# Patient Record
Sex: Female | Born: 1945 | Race: Black or African American | Hispanic: No | Marital: Married | Smoking: Former smoker
Health system: Southern US, Community
[De-identification: ages and names within clinical notes are randomized; demographics above are authoritative.]

## PROBLEM LIST (undated history)

## (undated) DIAGNOSIS — R112 Nausea with vomiting, unspecified: Secondary | ICD-10-CM

## (undated) DIAGNOSIS — Z87442 Personal history of urinary calculi: Secondary | ICD-10-CM

## (undated) DIAGNOSIS — I10 Essential (primary) hypertension: Secondary | ICD-10-CM

## (undated) DIAGNOSIS — K279 Peptic ulcer, site unspecified, unspecified as acute or chronic, without hemorrhage or perforation: Secondary | ICD-10-CM

## (undated) DIAGNOSIS — G8929 Other chronic pain: Secondary | ICD-10-CM

## (undated) DIAGNOSIS — B9681 Helicobacter pylori [H. pylori] as the cause of diseases classified elsewhere: Secondary | ICD-10-CM

## (undated) DIAGNOSIS — M1990 Unspecified osteoarthritis, unspecified site: Secondary | ICD-10-CM

## (undated) DIAGNOSIS — M48062 Spinal stenosis, lumbar region with neurogenic claudication: Secondary | ICD-10-CM

## (undated) DIAGNOSIS — Z8489 Family history of other specified conditions: Secondary | ICD-10-CM

## (undated) DIAGNOSIS — K635 Polyp of colon: Secondary | ICD-10-CM

## (undated) DIAGNOSIS — Z8719 Personal history of other diseases of the digestive system: Secondary | ICD-10-CM

## (undated) DIAGNOSIS — M545 Low back pain, unspecified: Secondary | ICD-10-CM

## (undated) DIAGNOSIS — K5900 Constipation, unspecified: Secondary | ICD-10-CM

## (undated) DIAGNOSIS — F40240 Claustrophobia: Secondary | ICD-10-CM

## (undated) DIAGNOSIS — IMO0002 Reserved for concepts with insufficient information to code with codable children: Secondary | ICD-10-CM

## (undated) DIAGNOSIS — F419 Anxiety disorder, unspecified: Secondary | ICD-10-CM

## (undated) DIAGNOSIS — K219 Gastro-esophageal reflux disease without esophagitis: Secondary | ICD-10-CM

## (undated) DIAGNOSIS — Z9889 Other specified postprocedural states: Secondary | ICD-10-CM

## (undated) HISTORY — PX: APPENDECTOMY: SHX54

## (undated) HISTORY — PX: TUBAL LIGATION: SHX77

## (undated) HISTORY — DX: Essential (primary) hypertension: I10

## (undated) HISTORY — DX: Helicobacter pylori (H. pylori) as the cause of diseases classified elsewhere: K27.9

## (undated) HISTORY — PX: BACK SURGERY: SHX140

## (undated) HISTORY — PX: RETINAL LASER PROCEDURE: SHX2339

## (undated) HISTORY — PX: ESOPHAGOGASTRODUODENOSCOPY: SHX1529

## (undated) HISTORY — PX: TOTAL ABDOMINAL HYSTERECTOMY: SHX209

## (undated) HISTORY — PX: DILATION AND CURETTAGE OF UTERUS: SHX78

## (undated) HISTORY — PX: LUMBAR DISC SURGERY: SHX700

## (undated) HISTORY — DX: Peptic ulcer, site unspecified, unspecified as acute or chronic, without hemorrhage or perforation: B96.81

## (undated) HISTORY — PX: UPPER GASTROINTESTINAL ENDOSCOPY: SHX188

## (undated) HISTORY — DX: Polyp of colon: K63.5

## (undated) HISTORY — DX: Gastro-esophageal reflux disease without esophagitis: K21.9

## (undated) HISTORY — PX: ABDOMINAL HYSTERECTOMY: SHX81

## (undated) HISTORY — PX: COLONOSCOPY: SHX174

---

## 2004-09-11 ENCOUNTER — Ambulatory Visit: Payer: Self-pay | Admitting: Anesthesiology

## 2004-09-19 ENCOUNTER — Ambulatory Visit: Payer: Self-pay | Admitting: Anesthesiology

## 2004-10-04 ENCOUNTER — Ambulatory Visit: Payer: Self-pay | Admitting: Anesthesiology

## 2004-10-05 ENCOUNTER — Emergency Department: Payer: Self-pay | Admitting: Emergency Medicine

## 2004-10-11 ENCOUNTER — Ambulatory Visit: Payer: Self-pay | Admitting: Anesthesiology

## 2004-10-19 ENCOUNTER — Ambulatory Visit: Payer: Self-pay | Admitting: Obstetrics and Gynecology

## 2004-11-13 ENCOUNTER — Ambulatory Visit: Payer: Self-pay | Admitting: Anesthesiology

## 2004-11-15 ENCOUNTER — Ambulatory Visit: Payer: Self-pay | Admitting: Urology

## 2004-12-27 ENCOUNTER — Inpatient Hospital Stay (HOSPITAL_COMMUNITY): Admission: RE | Admit: 2004-12-27 | Discharge: 2004-12-30 | Payer: Self-pay | Admitting: Neurosurgery

## 2005-06-11 ENCOUNTER — Emergency Department: Payer: Self-pay | Admitting: Unknown Physician Specialty

## 2005-06-11 ENCOUNTER — Other Ambulatory Visit: Payer: Self-pay

## 2005-06-12 ENCOUNTER — Ambulatory Visit: Payer: Self-pay | Admitting: Unknown Physician Specialty

## 2005-07-16 ENCOUNTER — Ambulatory Visit: Payer: Self-pay | Admitting: Unknown Physician Specialty

## 2005-10-23 ENCOUNTER — Ambulatory Visit: Payer: Self-pay | Admitting: Obstetrics and Gynecology

## 2006-11-27 ENCOUNTER — Ambulatory Visit: Payer: Self-pay | Admitting: Internal Medicine

## 2007-03-13 ENCOUNTER — Emergency Department: Payer: Self-pay | Admitting: Emergency Medicine

## 2008-02-17 ENCOUNTER — Ambulatory Visit: Payer: Self-pay | Admitting: Internal Medicine

## 2008-07-21 ENCOUNTER — Ambulatory Visit: Payer: Self-pay | Admitting: Internal Medicine

## 2008-09-02 ENCOUNTER — Ambulatory Visit: Payer: Self-pay | Admitting: Unknown Physician Specialty

## 2008-10-19 ENCOUNTER — Encounter: Admission: RE | Admit: 2008-10-19 | Discharge: 2008-10-19 | Payer: Self-pay | Admitting: Neurosurgery

## 2009-08-11 ENCOUNTER — Ambulatory Visit: Payer: Self-pay | Admitting: Internal Medicine

## 2009-08-16 ENCOUNTER — Ambulatory Visit: Payer: Self-pay | Admitting: Unknown Physician Specialty

## 2009-10-26 ENCOUNTER — Ambulatory Visit: Payer: Self-pay | Admitting: Unknown Physician Specialty

## 2009-11-16 ENCOUNTER — Ambulatory Visit: Payer: Self-pay | Admitting: Unknown Physician Specialty

## 2010-07-03 ENCOUNTER — Encounter: Admission: RE | Admit: 2010-07-03 | Discharge: 2010-07-03 | Payer: Self-pay | Admitting: Neurosurgery

## 2010-07-19 ENCOUNTER — Encounter: Admission: RE | Admit: 2010-07-19 | Discharge: 2010-07-19 | Payer: Self-pay | Admitting: Neurosurgery

## 2010-08-09 ENCOUNTER — Encounter: Admission: RE | Admit: 2010-08-09 | Discharge: 2010-08-09 | Payer: Self-pay | Admitting: Neurosurgery

## 2010-10-17 ENCOUNTER — Encounter
Admission: RE | Admit: 2010-10-17 | Discharge: 2010-10-17 | Payer: Self-pay | Source: Home / Self Care | Attending: Neurosurgery | Admitting: Neurosurgery

## 2010-12-26 ENCOUNTER — Other Ambulatory Visit: Payer: Self-pay | Admitting: Neurosurgery

## 2010-12-26 DIAGNOSIS — M545 Low back pain: Secondary | ICD-10-CM

## 2010-12-26 DIAGNOSIS — M48061 Spinal stenosis, lumbar region without neurogenic claudication: Secondary | ICD-10-CM

## 2010-12-27 ENCOUNTER — Ambulatory Visit
Admission: RE | Admit: 2010-12-27 | Discharge: 2010-12-27 | Disposition: A | Discharge status: Home / Self Care | Payer: BC Managed Care – PPO | Source: Ambulatory Visit | Attending: Neurosurgery | Admitting: Neurosurgery

## 2010-12-27 DIAGNOSIS — M545 Low back pain, unspecified: Secondary | ICD-10-CM

## 2010-12-27 DIAGNOSIS — M48061 Spinal stenosis, lumbar region without neurogenic claudication: Secondary | ICD-10-CM

## 2011-02-28 ENCOUNTER — Emergency Department: Payer: Self-pay | Admitting: Emergency Medicine

## 2011-03-09 NOTE — Op Note (Signed)
Victoria Molina, Victoria Molina               ACCOUNT NO.:  [id number]   MEDICAL RECORD NO.:  [id number]          PATIENT TYPE:  INP   LOCATION:  2899                         FACILITY:  [facility]   PHYSICIAN:  Coletta Memos, M.D.     DATE OF BIRTH:  [birth year]   DATE OF PROCEDURE:  12/27/2004  DATE OF DISCHARGE:                                 OPERATIVE REPORT   PREOPERATIVE DIAGNOSIS:  Lumbar stenosis, L3-4,   POSTOPERATIVE DIAGNOSIS:  Lumbar stenosis, lumbar spondylosis, degenerative  disk disease, L3-4.   SURGEON:  Coletta Memos, M.D.   ASSISTANT:  Stefani Dama, M.D.   ANESTHESIA:  General endotracheal.   COMPLICATIONS:  None.   INDICATIONS:  Victoria Molina is aa 65 year old woman who presents with pain  in the back and legs, especially in the left leg when she stands or walks  for a prolonged period of time.  She has had epidural injections which have  not been greatly beneficial.  She has no bowel and bladder dysfunction.  I  therefore recommended and she agreed to undergo operative decompression.   OPERATIVE NOTE:  Victoria Molina was brought to the operating room, intubated  and placed under a general anesthetic.  Her back was prepped and she was  draped in a sterile fashion.  Using a preoperative localizing film, I  injected 20 mL of 0.5% lidocaine with 1:200,000-strength epinephrine into  the midline and paraspinous region of the lumbar area.   The lumbar decompression at L3-4 was commenced by first incising the skin  and taking that incision down to the thoracolumbar fascia.  I then expose  the lamina of L3 and L4 bilaterally.  I took an x-ray and this showed my  approach to be at L3-4.  I then proceeded with a hemilaminectomy of L3 using  both Leksell rongeurs, Kerrison punches and a high-speed drill.  A  hemilaminectomy of L5 was also performed.  With Dr. Verlee Rossetti assistance, we  were able to remove bone.  I enlarged the canal and also the neural foramina  of L4 and L5 bilaterally.   I did this without complication.  Thickened  ligamentum was identified, as were hypertrophied facets.  After thoroughly  decompressing both the nerve roots and thecal sac, I irrigated the wound.  I  inspected the wound once more.  I felt that adequate decompression had been  obtained.  I irrigated it again.  I then closed the wound in layered fashion  using Vicryl sutures.  Dermabond was used for a sterile dressing.      KC/MEDQ  D:  12/27/2004  T:  12/28/2004  Job:  [id number]

## 2011-03-09 NOTE — Discharge Summary (Signed)
[name], Victoria Molina NO.:  [id number]   MEDICAL RECORD NO.:  [id number]          PATIENT TYPE:  INP   LOCATION:  3011                         FACILITY:  [facility]   PHYSICIAN:  Coletta Memos, M.D.     DATE OF BIRTH:  [birth year]   DATE OF ADMISSION:  12/27/2004  DATE OF DISCHARGE:  12/30/2004                                 DISCHARGE SUMMARY   ADMISSION DIAGNOSIS:  Lumbar stenosis at L4-5, neurogenic claudication   DISCHARGE DIAGNOSIS:  Lumbar stenosis at L4-5, neurogenic claudication.   PROCEDURE:  Lumbar L3-4 decompression via bilateral hemilaminectomies.   COMPLICATIONS:  None.   SURGEON:  Coletta Memos, M.D.   CONDITION ON DISCHARGE:  Alive and well.   DISPOSITION:  To home.   DISCHARGE MEDICATIONS:  Darvocet, which patient already had prescribed.   INDICATIONS FOR PROCEDURE:  [name] was admitted secondary to neurogenic  claudication in the lower extremities. This was caused by lumbar stenosis at  L3-4. I therefore recommended and she agreed to undergo bilateral lumbar  decompression. She was taken to the operating room and had an uncomplicated  procedure. Postoperatively, she did quite well. She had no problems while in  the hospital. She did not feel quite up to leaving on the first few days.  She is eating well and tolerating a regular diet. Her wound is clean and dry  without signs of infection at discharge.      KC/MEDQ  D:  12/29/2004  T:  12/31/2004  Job:  [id number]

## 2011-05-10 ENCOUNTER — Other Ambulatory Visit: Payer: Self-pay | Admitting: Neurosurgery

## 2011-05-10 DIAGNOSIS — M48061 Spinal stenosis, lumbar region without neurogenic claudication: Secondary | ICD-10-CM

## 2011-05-10 DIAGNOSIS — M545 Low back pain: Secondary | ICD-10-CM

## 2011-05-14 ENCOUNTER — Ambulatory Visit
Admission: RE | Admit: 2011-05-14 | Discharge: 2011-05-14 | Disposition: A | Discharge status: Home / Self Care | Payer: Medicare Other | Source: Ambulatory Visit | Attending: Neurosurgery | Admitting: Neurosurgery

## 2011-05-14 DIAGNOSIS — M545 Low back pain, unspecified: Secondary | ICD-10-CM

## 2011-05-14 DIAGNOSIS — M48061 Spinal stenosis, lumbar region without neurogenic claudication: Secondary | ICD-10-CM

## 2011-05-14 MED ORDER — IOHEXOL 180 MG/ML  SOLN
1.0000 mL | Freq: Once | INTRAMUSCULAR | Status: AC | PRN
  Administered 2011-05-14: 1 mL via EPIDURAL

## 2011-05-14 MED ORDER — METHYLPREDNISOLONE ACETATE 40 MG/ML INJ SUSP (RADIOLOG
120.0000 mg | Freq: Once | INTRAMUSCULAR | Status: AC
  Administered 2011-05-14: 120 mg via EPIDURAL

## 2011-05-17 ENCOUNTER — Encounter: Payer: Self-pay | Admitting: Dermatology

## 2011-08-13 ENCOUNTER — Other Ambulatory Visit: Payer: Self-pay | Admitting: Neurosurgery

## 2011-08-13 DIAGNOSIS — M48061 Spinal stenosis, lumbar region without neurogenic claudication: Secondary | ICD-10-CM

## 2011-08-20 ENCOUNTER — Ambulatory Visit
Admission: RE | Admit: 2011-08-20 | Discharge: 2011-08-20 | Disposition: A | Discharge status: Home / Self Care | Payer: Medicare Other | Source: Ambulatory Visit | Attending: Neurosurgery | Admitting: Neurosurgery

## 2011-08-20 DIAGNOSIS — M48061 Spinal stenosis, lumbar region without neurogenic claudication: Secondary | ICD-10-CM

## 2011-08-20 MED ORDER — GADOBENATE DIMEGLUMINE 529 MG/ML IV SOLN: 14.0000 mL | Freq: Once | INTRAVENOUS | Status: AC | PRN

## 2011-11-02 ENCOUNTER — Other Ambulatory Visit: Payer: Self-pay | Admitting: Specialist

## 2011-11-02 DIAGNOSIS — M25512 Pain in left shoulder: Secondary | ICD-10-CM

## 2011-11-09 ENCOUNTER — Other Ambulatory Visit: Payer: Medicare Other

## 2011-11-16 ENCOUNTER — Ambulatory Visit: Payer: Self-pay | Admitting: Unknown Physician Specialty

## 2011-11-30 ENCOUNTER — Ambulatory Visit
Admission: RE | Admit: 2011-11-30 | Discharge: 2011-11-30 | Disposition: A | Discharge status: Home / Self Care | Payer: Medicare Other | Source: Ambulatory Visit | Attending: Specialist | Admitting: Specialist

## 2011-11-30 DIAGNOSIS — M25512 Pain in left shoulder: Secondary | ICD-10-CM

## 2011-12-13 ENCOUNTER — Ambulatory Visit: Payer: Self-pay | Admitting: Unknown Physician Specialty

## 2011-12-17 LAB — PATHOLOGY REPORT

## 2011-12-21 ENCOUNTER — Other Ambulatory Visit: Payer: Self-pay | Admitting: Neurosurgery

## 2011-12-21 DIAGNOSIS — M48061 Spinal stenosis, lumbar region without neurogenic claudication: Secondary | ICD-10-CM

## 2011-12-26 ENCOUNTER — Other Ambulatory Visit: Payer: Medicare Other

## 2012-01-24 ENCOUNTER — Ambulatory Visit: Payer: Self-pay | Admitting: Unknown Physician Specialty

## 2012-01-31 ENCOUNTER — Ambulatory Visit: Payer: Self-pay | Admitting: Unknown Physician Specialty

## 2012-02-29 ENCOUNTER — Ambulatory Visit: Payer: Self-pay | Admitting: Internal Medicine

## 2012-02-29 LAB — CREATININE, SERUM
Creatinine: 0.72 mg/dL (ref 0.60–1.30)
EGFR (African American): >60
EGFR (Non-African Amer.): >60

## 2012-04-02 ENCOUNTER — Emergency Department: Payer: Self-pay | Admitting: Emergency Medicine

## 2012-04-02 LAB — COMPREHENSIVE METABOLIC PANEL
Albumin: 3.8 g/dL (ref 3.4–5.0)
Alkaline Phosphatase: 117 U/L (ref 50–136)
Anion Gap: 6 — ABNORMAL LOW (ref 7–16)
BUN: 12 mg/dL (ref 7–18)
Bilirubin,Total: 0.3 mg/dL (ref 0.2–1.0)
Calcium, Total: 8.9 mg/dL (ref 8.5–10.1)
Chloride: 106 mmol/L (ref 98–107)
Co2: 31 mmol/L (ref 21–32)
Creatinine: 0.64 mg/dL (ref 0.60–1.30)
EGFR (African American): >60
EGFR (Non-African Amer.): >60
Glucose: 92 mg/dL (ref 65–99)
Osmolality: 284 (ref 275–301)
Potassium: 3.5 mmol/L (ref 3.5–5.1)
SGOT(AST): 19 U/L (ref 15–37)
SGPT (ALT): 17 U/L
Sodium: 143 mmol/L (ref 136–145)
Total Protein: 7.4 g/dL (ref 6.4–8.2)

## 2012-04-02 LAB — CBC
HCT: 40.6 % (ref 35.0–47.0)
HGB: 13.4 g/dL (ref 12.0–16.0)
MCH: 29.3 pg (ref 26.0–34.0)
MCHC: 33.1 g/dL (ref 32.0–36.0)
MCV: 89 fL (ref 80–100)
Platelet: 354 10*3/uL (ref 150–440)
RBC: 4.58 10*6/uL (ref 3.80–5.20)
RDW: 13.2 % (ref 11.5–14.5)
WBC: 8.2 10*3/uL (ref 3.6–11.0)

## 2012-04-02 LAB — URINALYSIS, COMPLETE
Bacteria: NONE SEEN
Bilirubin,UR: NEGATIVE
Blood: NEGATIVE
Glucose,UR: NEGATIVE mg/dL (ref 0–75)
Ketone: NEGATIVE
Leukocyte Esterase: NEGATIVE
Nitrite: NEGATIVE
Ph: 8 (ref 4.5–8.0)
Protein: NEGATIVE
RBC,UR: 2 /HPF (ref 0–5)
Specific Gravity: 1.008 (ref 1.003–1.030)
Squamous Epithelial: 1
WBC UR: 4 /HPF (ref 0–5)

## 2012-04-02 LAB — LIPASE, BLOOD: Lipase: 66 U/L — ABNORMAL LOW (ref 73–393)

## 2012-05-08 ENCOUNTER — Ambulatory Visit (INDEPENDENT_AMBULATORY_CARE_PROVIDER_SITE_OTHER): Payer: Medicare Other | Admitting: Gastroenterology

## 2012-05-08 ENCOUNTER — Telehealth: Payer: Self-pay

## 2012-05-08 ENCOUNTER — Encounter: Payer: Self-pay | Admitting: Gastroenterology

## 2012-05-08 VITALS — BP 126/76 | HR 92 | Temp 97.4°F | Ht 62.0 in | Wt 160.0 lb

## 2012-05-08 DIAGNOSIS — K219 Gastro-esophageal reflux disease without esophagitis: Secondary | ICD-10-CM | POA: Insufficient documentation

## 2012-05-08 DIAGNOSIS — Z1211 Encounter for screening for malignant neoplasm of colon: Secondary | ICD-10-CM | POA: Insufficient documentation

## 2012-05-08 DIAGNOSIS — D126 Benign neoplasm of colon, unspecified: Secondary | ICD-10-CM

## 2012-05-08 DIAGNOSIS — K635 Polyp of colon: Secondary | ICD-10-CM

## 2012-05-08 MED ORDER — BACLOFEN 10 MG PO TABS: ORAL_TABLET | ORAL | Status: AC

## 2012-05-08 MED ORDER — ESOMEPRAZOLE MAGNESIUM 40 MG PO CPDR: DELAYED_RELEASE_CAPSULE | ORAL | Status: DC

## 2012-05-08 NOTE — Telephone Encounter (Signed)
Called to let pt know that Dr. Darrick Penna said she does want her to remain on the Isosorbide for now. Line was busy. ( Also, per the pharmacist at the [facility] on [address] in [place], Nexium wll require a PA. They will fax over the info.

## 2012-05-08 NOTE — Progress Notes (Signed)
Faxed to PCP, Dr Dan Humphreys

## 2012-05-08 NOTE — Progress Notes (Addendum)
  Subjective:    Patient ID: Victoria Molina, female    DOB: 14-Oct-1946, 66 y.o.   MRN: [id number]  PCP: Dan Humphreys  HPI GERD UNCONTROLLED. TAKING PRILOSEC BUT sX NOT WELL CONTROLLED. NEXIUM WORKS BETTER. GAINED ~10 LBS SINCE 2002. Has had 3-4 EGDs since 2002. Had had 2-3 TCS SINCE 2002: HYPERPLASTIC POLYPS & HEMORRHOIDS. Nausea and vomiting 2-3 times a week. Feel heartburn pain: 2-3X/week usu after she eats. HAS SMALL HIATAL HERNIA.  NO PROBLEMS SWALLOWING, RECTAL BLEEDING, OR MELENA. NO ASA OR NSAIDs.  Past Medical History  Diagnosis Date  . HTN (hypertension)   . GERD (gastroesophageal reflux disease)   . Colon polyps 2006, 2011    HYPERPLASTIC  . H pylori ulcer 2002,2006    AF THEN ABO, bX NEG 2011/2013    Past Surgical History  Procedure Date  . Colonoscopy   . Upper gastrointestinal endoscopy   . Total abdominal hysterectomy   . Back surgery     Allergies  Allergen Reactions  . Iodinated Diagnostic Agents Hives and Itching    Pt pre-meds with Benadryl 50mg  PO; tolerates ESI's.  jkl  . Sulfa Antibiotics Hives and Itching  . Vioxx (Rofecoxib) Swelling    Swelling of legs and feet    Current Outpatient Prescriptions  Medication Sig Dispense Refill  . ALPRAZolam (XANAX) 0.5 MG tablet Take 0.5 mg by mouth as needed.      Marland Kitchen AMLODIPINE BESYLATE PO Take 5 mg by mouth Daily.      . isosorbide dinitrate (ISORDIL) 10 MG tablet Take 10 mg by mouth 3 (three) times daily.      Marland Kitchen omeprazole (PRILOSEC) 40 MG capsule Take 40 mg by mouth 2 (two) times daily.      Marland Kitchen esomeprazole (NEXIUM) 40 MG capsule 1 PO 30 MINUTES PRIOR TO MEALS BID  60 capsule  5      Review of Systems CT A/P W IVC MAY 2013: NAIAP    Objective:   Physical Exam  Constitutional: She is oriented to person, place, and time. She appears well-nourished. No distress.  HENT:  Head: Normocephalic and atraumatic.  Mouth/Throat: Oropharynx is clear and moist. No oropharyngeal exudate.  Eyes: No scleral icterus.  Neck:  Normal range of motion. Neck supple.  Cardiovascular: Normal rate and normal heart sounds.   Pulmonary/Chest: Effort normal and breath sounds normal. No respiratory distress.  Abdominal: Soft. Bowel sounds are normal. She exhibits no distension. There is tenderness (MILD TTP IN EPIGASTRIUM).  Lymphadenopathy:    She has no cervical adenopathy.  Neurological: She is alert and oriented to person, place, and time.       NO  NEW FOCAL DEFICITS   Psychiatric: She has a normal mood and affect.          Assessment & Plan:

## 2012-05-08 NOTE — Patient Instructions (Addendum)
YOUR SYMPTOMS ARE DUE TO PROBLEMS WITH REFLUX. YOU NEED NEXIUM BECAUSE IT WORKS.    TAKE NEXIUM (OR PRILOSEC) 30 MINUTES PRIOR TO MEALS BREAKFAST AND SUPPER.  START BACLOFEN 30 MINUTES PRIOR TO BREAKFAST, LUNCH, AND DINNER. IT MAY CAUSE DIZZINESS OR DROWSINESS. CALL ME IF YOU HAVE A LARGE CO-PAY.  FOLLOW A SOFT MECHANICAL LOW FAT DIET. SEE INFO BELOW.  LOSE 10 LBS OVER THE NEXT 2-3 MOS.  FOLLOW UP IN 2 MOS.    Lifestyle and home remedies TO HELP CONTROL REFLUX SYMPTOMS You may eliminate or reduce the frequency of heartburn by making the following lifestyle changes:    Control your weight. Being overweight is a major risk factor for heartburn and GERD. Excess pounds put pressure on your abdomen, pushing up your stomach and causing acid to back up into your esophagus.     Eat smaller meals. 4 TO 6 MEALS A DAY. This reduces pressure on the lower esophageal sphincter, helping to prevent the valve from opening and acid from washing back into your esophagus.     Loosen your belt. Clothes that fit tightly around your waist put pressure on your abdomen and the lower esophageal sphincter.    Eliminate heartburn triggers. Everyone has specific triggers.     Common triggers such as fatty or fried foods, spicy food, tomato sauce, carbonated beverages, alcohol, chocolate, mint, garlic, onion, caffeine and nicotine may make heartburn worse.     Avoid stooping or bending. Tying your shoes is OK. Bending over for longer periods to weed your garden isn't, especially soon after eating.     Don't lie down after a meal. Wait at least three to four hours after eating before going to bed, and don't lie down right after eating.   Alternative medicine   Several home remedies exist for treating GERD, but they provide only temporary relief. They include drinking baking soda (sodium bicarbonate) added to water or drinking other fluids such as baking soda mixed with cream of tartar and water.   Although these  liquids create temporary relief by neutralizing, washing away or buffering acids, eventually they aggravate the situation by adding gas and fluid to your stomach, increasing pressure and causing more acid reflux. Further, adding more sodium to your diet may increase your blood pressure and add stress to your heart, and excessive bicarbonate ingestion can alter the acid-base balance in your body.  SOFT MECHANICAL DIET This SOFT MECHANICAL DIET is restricted to:  Foods that are moist, soft-textured, and easy to chew and swallow. MEATS SHOULD BE CHOPPED OR GROUND.  Meats that are ground or are minced no larger than one-quarter inch pieces. Meats are moist with gravy or sauce added.   Foods that do not include bread or bread-like textures except soft pancakes, well-moistened with syrup or sauce.   Textures with some chewing ability required.   Casseroles without rice.   Cooked vegetables that are less than half an inch in size and easily mashed with a fork. No cooked corn, peas, broccoli, cauliflower, cabbage, Brussels sprouts, asparagus, or other fibrous, non-tender or rubbery cooked vegetables.   Canned fruit except for pineapple. Fruit must be cut into pieces no larger than half an inch in size.   Foods that do not include nuts, seeds, coconut, or sticky textures.    FOOD TEXTURES FOR DYSPHAGIA DIET LEVEL 2 -SOFT MECHANICAL DIET (includes all foods on Dysphagia Diet Level 1 - Pureed, in addition to the foods listed below)  FOOD GROUP: Breads. RECOMMENDED:  Soft pancakes, well-moistened with syrup or sauce. AVOID: All others.  FOOD GROUP: Cereals. RECOMMENDED: Cooked cereals with little texture, including oatmeal. Unprocessed wheat bran stirred into cereals for bulk. Note: If thin liquids are restricted, it is important that all of the liquid is absorbed into the cereal. AVOID: All dry cereals and any cooked cereals that may contain flax seeds or other seeds or nuts. Whole-grain, dry, or  coarse cereals. Cereals with nuts, seeds, dried fruit, and/or coconut.  FOOD GROUP: Desserts. RECOMMENDED: Pudding, custard. Soft fruit pies with bottom crust only. Canned fruit (excluding pineapple). Soft, moist cakes with icing.Frozen malts, milk shakes, frozen yogurt, eggnog, nutritional supplements, ice cream, sherbet, regular or sugar-free gelatin, or any foods that become thin liquid at either room (70 F) or body temperature (98 F). AVOID: Dry, coarse cakes and cookies. Anything with nuts, seeds, coconut, pineapple, or dried fruit. Breakfast yogurt with nuts. Rice or bread pudding.  FOOD GROUP: Fats. RECOMMENDED: Butter, margarine, cream for cereal (depending on liquid consistency recommendations), gravy, cream sauces, sour cream, sour cream dips with soft additives, mayonnaise, salad dressings, cream cheese, cream cheese spreads with soft additives, whipped toppings. AVOID: All fats with coarse or chunky additives.  FOOD GROUP: Fruits. RECOMMENDED: Soft drained, canned, or cooked fruits without seeds or skin. Fresh soft and ripe banana. Fruit juices with a small amount of pulp. If thin liquids are restricted, fruit juices should be thickened to appropriate consistency. AVOID: Fresh or frozen fruits. Cooked fruit with skin or seeds. Dried fruits. Fresh, canned, or cooked pineapple.  FOOD GROUP: Meats and Meat Substitutes. (Meat pieces should not exceed 1/4 of an inch cube and should be tender.) RECOMMENDED: Moistened ground or cooked meat, poultry, or fish. Moist ground or tender meat may be served with gravy or sauce. Casseroles without rice. Moist macaroni and cheese, well-cooked pasta with meat sauce, tuna noodle casserole, soft, moist lasagna. Moist meatballs, meatloaf, or fish loaf. Protein salads, such as tuna or egg without large chunks, celery, or onion. Cottage cheese, smooth quiche without large chunks. Poached, scrambled, or soft-cooked eggs (egg yolks should not be "runny" but  should be moist and able to be mashed with butter, margarine, or other moisture added to them). (Cook eggs to 160 F or use pasteurized eggs for safety.) Souffls may have small, soft chunks. Tofu. Well-cooked, slightly mashed, moist legumes, such as baked beans. All meats or protein substitutes should be served with sauces or moistened to help maintain cohesiveness in the oral cavity. AVOID: Dry meats, tough meats (such as bacon, sausage, hot dogs, bratwurst). Dry casseroles or casseroles with rice or large chunks. Peanut butter. Cheese slices and cubes. Hard-cooked or crisp fried eggs. Sandwiches.Pizza.  FOOD GROUP: Potatoes and Starches. RECOMMENDED: Well-cooked, moistened, boiled, baked, or mashed potatoes. Well-cooked shredded hash brown potatoes that are not crisp. (All potatoes need to be moist and in sauces.)Well-cooked noodles in sauce. Spaetzel or soft dumplings that have been moistened with butter or gravy. AVOID: Potato skins and chips. Fried or French-fried potatoes. Rice.  FOOD GROUP: Soups. RECOMMENDED: Soups with easy-to-chew or easy-to-swallow meats or vegetables: Particle sizes in soups should be less than 1/2 inch. Soups will need to be thickened to appropriate consistency if soup is thinner than prescribed liquid consistency. AVOID: Soups with large chunks of meat and vegetables. Soups with rice, corn, peas.  FOOD GROUP: Vegetables. RECOMMENDED: All soft, well-cooked vegetables. Vegetables should be less than a half inch. Should be easily mashed with a fork. AVOID: Cooked corn and  peas. Broccoli, cabbage, Brussels sprouts, asparagus, or other fibrous, non-tender or rubbery cooked vegetables.  FOOD GROUP: Miscellaneous. RECOMMENDED: Jams and preserves without seeds, jelly. Sauces, salsas, etc., that may have small tender chunks less than 1/2 inch. Soft, smooth chocolate bars that are easily chewed. AVOID: Seeds, nuts, coconut, or sticky foods. Chewy candies such as caramels or  licorice.      Low-Fat Diet BREADS, CEREALS, PASTA, RICE, DRIED PEAS, AND BEANS These products are high in carbohydrates and most are low in fat. Therefore, they can be increased in the diet as substitutes for fatty foods. They too, however, contain calories and should not be eaten in excess. Cereals can be eaten for snacks as well as for breakfast.   FRUITS AND VEGETABLES It is good to eat fruits and vegetables. Besides being sources of fiber, both are rich in vitamins and some minerals. They help you get the daily allowances of these nutrients. Fruits and vegetables can be used for snacks and desserts.  MEATS Limit lean meat, chicken, [place], and fish to no more than 6 ounces per day. Beef, Pork, and Lamb Use lean cuts of beef, pork, and lamb. Lean cuts include:  Extra-lean ground beef.  Arm roast.  Sirloin tip.  Center-cut ham.  Round steak.  Loin chops.  Rump roast.  Tenderloin.  Trim all fat off the outside of meats before cooking. It is not necessary to severely decrease the intake of red meat, but lean choices should be made. Lean meat is rich in protein and contains a highly absorbable form of iron. Premenopausal women, in particular, should avoid reducing lean red meat because this could increase the risk for low red blood cells (iron-deficiency anemia).  Chicken and [place] These are good sources of protein. The fat of poultry can be reduced by removing the skin and underlying fat layers before cooking. Chicken and [place] can be substituted for lean red meat in the diet. Poultry should not be fried or covered with high-fat sauces. Fish and Shellfish Fish is a good source of protein. Shellfish contain cholesterol, but they usually are low in saturated fatty acids. The preparation of fish is important. Like chicken and [place], they should not be fried or covered with high-fat sauces. EGGS Egg whites contain no fat or cholesterol. They can be eaten often. Try 1 to 2 egg whites  instead of whole eggs in recipes or use egg substitutes that do not contain yolk. MILK AND DAIRY PRODUCTS Use skim or 1% milk instead of 2% or whole milk. Decrease whole milk, natural, and processed cheeses. Use nonfat or low-fat (2%) cottage cheese or low-fat cheeses made from vegetable oils. Choose nonfat or low-fat (1 to 2%) yogurt. Experiment with evaporated skim milk in recipes that call for heavy cream. Substitute low-fat yogurt or low-fat cottage cheese for sour cream in dips and salad dressings. Have at least 2 servings of low-fat dairy products, such as 2 glasses of skim (or 1%) milk each day to help get your daily calcium intake. FATS AND OILS Reduce the total intake of fats, especially saturated fat. Butterfat, lard, and beef fats are high in saturated fat and cholesterol. These should be avoided as much as possible. Vegetable fats do not contain cholesterol, but certain vegetable fats, such as coconut oil, palm oil, and palm kernel oil are very high in saturated fats. These should be limited. These fats are often used in bakery goods, processed foods, popcorn, oils, and nondairy creamers. Vegetable shortenings and some peanut butters  contain hydrogenated oils, which are also saturated fats. Read the labels on these foods and check for saturated vegetable oils. Unsaturated vegetable oils and fats do not raise blood cholesterol. However, they should be limited because they are fats and are high in calories. Total fat should still be limited to 30% of your daily caloric intake. Desirable liquid vegetable oils are corn oil, cottonseed oil, olive oil, canola oil, safflower oil, soybean oil, and sunflower oil. Peanut oil is not as good, but small amounts are acceptable. Buy a heart-healthy tub margarine that has no partially hydrogenated oils in the ingredients. Mayonnaise and salad dressings often are made from unsaturated fats, but they should also be limited because of their high calorie and fat  content. Seeds, nuts, peanut butter, olives, and avocados are high in fat, but the fat is mainly the unsaturated type. These foods should be limited mainly to avoid excess calories and fat. OTHER EATING TIPS Snacks  Most sweets should be limited as snacks. They tend to be rich in calories and fats, and their caloric content outweighs their nutritional value. Some good choices in snacks are graham crackers, melba toast, soda crackers, bagels (no egg), English muffins, fruits, and vegetables. These snacks are preferable to snack crackers, [place] fries, TORTILLA CHIPS, and POTATO chips. Popcorn should be air-popped or cooked in small amounts of liquid vegetable oil. Desserts Eat fruit, low-fat yogurt, and fruit ices instead of pastries, cake, and cookies. Sherbet, angel food cake, gelatin dessert, frozen low-fat yogurt, or other frozen products that do not contain saturated fat (pure fruit juice bars, frozen ice pops) are also acceptable.  COOKING METHODS Choose those methods that use little or no fat. They include: Poaching.  Braising.  Steaming.  Grilling.  Baking.  Stir-frying.  Broiling.  Microwaving.  Foods can be cooked in a nonstick pan without added fat, or use a nonfat cooking spray in regular cookware. Limit fried foods and avoid frying in saturated fat. Add moisture to lean meats by using water, broth, cooking wines, and other nonfat or low-fat sauces along with the cooking methods mentioned above. Soups and stews should be chilled after cooking. The fat that forms on top after a few hours in the refrigerator should be skimmed off. When preparing meals, avoid using excess salt. Salt can contribute to raising blood pressure in some people.  EATING AWAY FROM HOME Order entres, potatoes, and vegetables without sauces or butter. When meat exceeds the size of a deck of cards (3 to 4 ounces), the rest can be taken home for another meal. Choose vegetable or fruit salads and ask for  low-calorie salad dressings to be served on the side. Use dressings sparingly. Limit high-fat toppings, such as bacon, crumbled eggs, cheese, sunflower seeds, and olives. Ask for heart-healthy tub margarine instead of butter.

## 2012-05-09 NOTE — Telephone Encounter (Signed)
Called pt and LMOM per her request to continue the Isosorbide for now and that the Nexium will need PA and the pharmacy is to fax the info here.

## 2012-05-09 NOTE — Telephone Encounter (Signed)
This is already being worked on ?

## 2012-05-12 ENCOUNTER — Telehealth: Payer: Self-pay | Admitting: *Deleted

## 2012-05-12 NOTE — Telephone Encounter (Signed)
Victoria Molina called today. She is having some side effects from her baclofen medication. It is making her lightheaded. She would like a call back. Thanks.

## 2012-05-12 NOTE — Telephone Encounter (Signed)
Pt aware to stop taking medication.

## 2012-05-12 NOTE — Telephone Encounter (Signed)
PLEASE CALL PT.  SHE IS ON A LOW DOSE OF BACLOFEN. SHE SHOULD STOP TAKING MEDS IF IT MAKES HER FEEL BAD.

## 2012-05-12 NOTE — Telephone Encounter (Signed)
Per Dawn: Pt returned your call, please try to call her back. Thanks

## 2012-05-12 NOTE — Telephone Encounter (Signed)
I called Pt back. She wants to know if she can take a half of pill because it makes her sick on her stomach and light headed. She did not take one this morning but she was going to take the lunch dose.Please advise.

## 2012-05-12 NOTE — Telephone Encounter (Signed)
Pt returned your call, please try to call her back. Thanks.

## 2012-05-12 NOTE — Telephone Encounter (Signed)
Tried to call with no answer  

## 2012-05-22 ENCOUNTER — Other Ambulatory Visit: Payer: Self-pay

## 2012-05-22 ENCOUNTER — Telehealth: Payer: Self-pay

## 2012-05-22 MED ORDER — ESOMEPRAZOLE MAGNESIUM 40 MG PO CPDR: DELAYED_RELEASE_CAPSULE | ORAL | Status: DC

## 2012-05-22 NOTE — Telephone Encounter (Signed)
PLEASE CALL PT.  SHE HAD BOTH PRECRIPTIONS FOR NEXIUM & BACLOFEN SENT 7/18-1 MO SUPPLY WITH A  YEAR OF REFILLS. SHE NEEDS TO CALL THE PHARMACY. SHE NEEDS TO DECIDE IF SHE WANTS ME OR DR. ELLIOTT TO BE HER GI DOCTOR. SHE SHOULD LET us KNOW WHO SHE PLANS TO FOLLOW UP WITH.Victoria Molina

## 2012-05-22 NOTE — Telephone Encounter (Signed)
Pt called and said she has not gotten her Nexium. ( Per Raynelle Fanning she is waiting on the pharmacy to call back and let her know what insurance she has). Pt also said she had been going to Dr. Mechele Collin in [place], [place], prior to coming here for second opinion with Dr. Darrick Penna. She said she was surprised  when they called her a few days ago and said she needed some more blood work, and a CT. She said she told them she thought they had done enough blood work, but they insisted she needed something else and she went and did that. She said she told them that she was not going to do the CT because it hadn't been long since she did one. She also said Dr. Earnest Conroy assistant, Selena Batten, said that she thought that the pt does not have reflux as much as she thinks it is something wrong with her lungs. She just wants to know how Dr. Darrick Penna feels about this. She is aware that Dr. Darrick Penna is at the hospital doing procedures and it might be a little while before we call her back at home.

## 2012-05-22 NOTE — Telephone Encounter (Signed)
Informed pt. She said she told Dr. Mechele Collin that she chooses Dr. Darrick Penna. I also asked pt to have her pharmacy call Raynelle Fanning in reference to her prescription insurance. Raynelle Fanning said she has been unable to get info from the pharmacy that she needs for the Nexium.

## 2012-05-23 NOTE — Telephone Encounter (Signed)
Finally received correct insurance information from pharmacy. PA has been faxed.

## 2012-05-26 NOTE — Telephone Encounter (Signed)
REVIEWED.  

## 2012-05-27 ENCOUNTER — Telehealth: Payer: Self-pay | Admitting: *Deleted

## 2012-05-27 NOTE — Telephone Encounter (Signed)
Victoria Molina called today. She is having a problem getting her nexium covered through her medicare part B. Please call her back. Thanks.

## 2012-05-27 NOTE — Telephone Encounter (Signed)
Called Pt and told her what was going on with the insurance.

## 2012-05-30 NOTE — Progress Notes (Signed)
Reminder in epic to follow up in 2 months with SF in E30 

## 2012-06-18 NOTE — Assessment & Plan Note (Signed)
NOT IDEALLY CONTROLLED DUE TO LIFESTYLE FACTORS.  DISCUSSED MODIFYING GERD FACTORS. HO GIVEN.  TAKE NEXIUM (OR PRILOSEC) 30 MINUTES PRIOR TO MEALS BREAKFAST AND SUPPER.  START BACLOFEN 30 MINUTES PRIOR TO BREAKFAST, LUNCH, AND DINNER. IT MAY CAUSE DIZZINESS OR DROWSINESS. CALL ME IF YOU HAVE A LARGE CO-PAY.  FOLLOW A SOFT MECHANICAL LOW FAT DIET.   LOSE 10 LBS OVER THE NEXT 2-3 MOS.  FOLLOW UP IN 2 MOS.

## 2012-06-20 ENCOUNTER — Ambulatory Visit: Payer: Self-pay | Admitting: Specialist

## 2012-06-20 LAB — CREATININE, SERUM
Creatinine: 0.81 mg/dL (ref 0.60–1.30)
EGFR (African American): >60
EGFR (Non-African Amer.): >60

## 2012-06-24 NOTE — Progress Notes (Signed)
Faxed to PCP

## 2012-07-09 ENCOUNTER — Ambulatory Visit (INDEPENDENT_AMBULATORY_CARE_PROVIDER_SITE_OTHER): Payer: Medicare Other | Admitting: Gastroenterology

## 2012-07-09 ENCOUNTER — Encounter: Payer: Self-pay | Admitting: Gastroenterology

## 2012-07-09 VITALS — BP 132/69 | HR 69 | Temp 98.1°F | Ht 62.0 in | Wt 156.2 lb

## 2012-07-09 DIAGNOSIS — D126 Benign neoplasm of colon, unspecified: Secondary | ICD-10-CM

## 2012-07-09 DIAGNOSIS — K635 Polyp of colon: Secondary | ICD-10-CM

## 2012-07-09 DIAGNOSIS — K219 Gastro-esophageal reflux disease without esophagitis: Secondary | ICD-10-CM

## 2012-07-09 MED ORDER — ESOMEPRAZOLE MAGNESIUM 40 MG PO CPDR: DELAYED_RELEASE_CAPSULE | ORAL | Status: DC

## 2012-07-09 NOTE — Assessment & Plan Note (Addendum)
Sx BETTER CONTROLLED.  CONTINUE WEIGHT LOSS EFFORTS.  CONTINUE THE SOFT MECHANICAL LOW FAT DIET.  CONTINUE BACLOFEN AND NEXIUM. REFILL NEXIUM FOR 1 YEAR.  FOLLOW UP IN 6 MOS.

## 2012-07-09 NOTE — Assessment & Plan Note (Signed)
TCS IN 2021

## 2012-07-09 NOTE — Progress Notes (Signed)
  Subjective:    Patient ID: Victoria Molina, female    DOB: [birth year], 66 y.o.   MRN: [id number]  PCP: Dan Humphreys  HPI Feel heartburn pain(BURNING):  1 X/week usu after she eats. HAS SMALL HIATAL HERNIA. NO PROBLEMS SWALLOWING, RECTAL BLEEDING, OR MELENA. NO ASA OR NSAIDs. Lost 4 lbs since last visit. C/o sob and concenrs about having surgery. ALSO C/O SINUS DRAINAGE/PRESSURE NOT RESPONDING TO ABX. C/O MILD EPIGASTRIC DISCOMFORT AND L RIB PAIN.   Past Medical History  Diagnosis Date  . HTN (hypertension)   . GERD (gastroesophageal reflux disease)   . Colon polyps 2006, 2011    HYPERPLASTIC  . H pylori ulcer 2002,2006    AF THEN ABO, bX NEG 2011/2013    Past Surgical History  Procedure Date  . Colonoscopy   . Upper gastrointestinal endoscopy   . Total abdominal hysterectomy   . Back surgery    Allergies  Allergen Reactions  . Iodinated Diagnostic Agents Hives and Itching    Pt pre-meds with Benadryl 50mg  PO; tolerates ESI's.  jkl  . Sulfa Antibiotics Hives and Itching  . Vioxx (Rofecoxib) Swelling    Swelling of legs and feet    Current Outpatient Prescriptions  Medication Sig Dispense Refill  . ALPRAZolam (XANAX) 0.5 MG tablet Take 0.5 mg by mouth as needed.      Marland Kitchen AMLODIPINE BESYLATE PO Take 5 mg by mouth Daily.      Marland Kitchen amoxicillin (AMOXIL) 500 MG capsule Take 500 mg by mouth 3 (three) times daily.       Marland Kitchen esomeprazole (NEXIUM) 40 MG capsule 1 PO 30 MINUTES PRIOR TO MEALS BID    . isosorbide dinitrate (ISORDIL) 10 MG tablet Take 10 mg by mouth 3 (three) times daily.      Marland Kitchen         BACLOFEN 1/2-1 PO QAC TID      Review of Systems     Objective:   Physical Exam  Vitals reviewed. Constitutional: She is oriented to person, place, and time. She appears well-nourished. No distress.  HENT:  Head: Normocephalic and atraumatic.  Eyes: Pupils are equal, round, and reactive to light. No scleral icterus.  Neck: Normal range of motion. Neck supple.  Cardiovascular: Normal  rate, regular rhythm and normal heart sounds.   Pulmonary/Chest: Effort normal and breath sounds normal.  Abdominal: Soft. Bowel sounds are normal. She exhibits no distension.  Musculoskeletal: Normal range of motion. She exhibits no edema.  Neurological: She is alert and oriented to person, place, and time.  Psychiatric: She has a normal mood and affect.          Assessment & Plan:

## 2012-07-09 NOTE — Progress Notes (Signed)
Faxed to PCP

## 2012-07-09 NOTE — Patient Instructions (Addendum)
KEEP UP THE GOOD WORK! CONTINUE YOUR WEIGHT LOSS EFFORTS.  CONTINUE THE SOFT MECHANICAL LOW FAT DIET.  YOU MAY USE ZYRTEC 1/2 TO 1 PILL AT NIGHT FOR THE NEXT 7 DAYS TO TREAT ALLERGY SYMPTOMS.  CONTINUE BACLOFEN AND NEXIUM.  FOLLOW UP IN 6 MOS.

## 2012-07-10 ENCOUNTER — Telehealth: Payer: Self-pay | Admitting: *Deleted

## 2012-07-10 NOTE — Telephone Encounter (Signed)
Pt would like to know if you can call in the omeprazole 40 mg to the [facility] in [place]. She can not afford the nexium co-pay. Please advise

## 2012-07-10 NOTE — Telephone Encounter (Signed)
Victoria Molina called today. She went to pick up her nexium and the copay was $70. She cannot afford it and wanted to know what you can do to help her. Thanks.

## 2012-07-14 MED ORDER — OMEPRAZOLE 20 MG PO CPDR: DELAYED_RELEASE_CAPSULE | ORAL | Status: DC

## 2012-07-14 NOTE — Addendum Note (Signed)
Addended by: [place] on: 07/14/2012 03:41 PM   Modules accepted: Orders

## 2012-07-14 NOTE — Telephone Encounter (Signed)
LMOM to call back

## 2012-07-14 NOTE — Telephone Encounter (Signed)
Recall made 

## 2012-07-14 NOTE — Telephone Encounter (Addendum)
PLEASE CALL PT.  RX FOR OMEPRAZOLE SENT.  TAKE PRILOSEC 30 MINUTES PRIOR TO MEALS BREAKFAST AND SUPPER.  CONTINUE BACLOFEN 30 MINUTES PRIOR TO BREAKFAST, LUNCH, AND DINNER. IT MAY CAUSE DIZZINESS OR DROWSINESS.  FOLLOW A SOFT MECHANICAL LOW FAT DIET.  LOSE 10 LBS OVER THE NEXT 2-3 MOS.   FOLLOW UP IN OCT OR NOV 2013.

## 2012-07-18 NOTE — Progress Notes (Signed)
Reminder in epic to follow up in 6 months and TCS in 2021

## 2012-07-24 NOTE — Telephone Encounter (Signed)
Mailed Pt a letter to contact our office

## 2012-07-28 NOTE — Telephone Encounter (Signed)
Pt called this morning and knows about the medication.

## 2012-07-29 ENCOUNTER — Encounter: Payer: Self-pay | Admitting: *Deleted

## 2012-08-27 ENCOUNTER — Ambulatory Visit: Payer: Medicare Other | Admitting: Gastroenterology

## 2012-09-10 ENCOUNTER — Encounter: Payer: Self-pay | Admitting: Gastroenterology

## 2012-09-10 ENCOUNTER — Ambulatory Visit (INDEPENDENT_AMBULATORY_CARE_PROVIDER_SITE_OTHER): Payer: Medicare Other | Admitting: Gastroenterology

## 2012-09-10 VITALS — BP 133/77 | HR 78 | Temp 97.2°F | Ht 62.0 in | Wt 160.8 lb

## 2012-09-10 DIAGNOSIS — K219 Gastro-esophageal reflux disease without esophagitis: Secondary | ICD-10-CM

## 2012-09-10 NOTE — Progress Notes (Signed)
  Subjective:    Patient ID: Victoria Molina, female    DOB: 09-Nov-1945, 67 y.o.   MRN: [id number]  PCP: Dan Humphreys  HPI C/O SINUS PROBLEMS AND COUGH. HEARTBURN DOING WELL ON OMEPRAZOLE BID. CAN HAVE RARE BREAKTHROUGH SX IF EATS THE WRONG THING.PAIN IN RUQ WHEN SHE EATS & CAN HAPPEN EVERY DAY. THOUGHT IT WAS HER GB. STILL TAKING ABX: AMOXICILLIN. MORE NOTICEABLE SINCE BEEN ON ABX. WAS SO HOARSE SHE COULDN'T TALK. BEEN IN THE HOUSE FOR THE PAST WEEK. TAKING 2/DAY BECAUSE SHE DOESN'T EAT AFTER 5 PM.  Past Medical History  Diagnosis Date  . HTN (hypertension)   . GERD (gastroesophageal reflux disease)   . Colon polyps 2006, 2011    HYPERPLASTIC  . H pylori ulcer 2002,2006    AF THEN ABO, bX NEG 2011/2013    Past Surgical History  Procedure Date  . Upper gastrointestinal endoscopy   . Total abdominal hysterectomy   . Back surgery   . Colonoscopy 2006, 2011    HYPERPLASTIC POLYPS    Allergies  Allergen Reactions  . Iodinated Diagnostic Agents Hives and Itching    Pt pre-meds with Benadryl 50mg  PO; tolerates ESI's.  jkl  . Sulfa Antibiotics Hives and Itching  . Vioxx (Rofecoxib) Swelling    Swelling of legs and feet    Current Outpatient Prescriptions  Medication Sig Dispense Refill  . ALPRAZolam (XANAX) 0.5 MG tablet Take 0.5 mg by mouth as needed.      Marland Kitchen AMLODIPINE BESYLATE PO Take 5 mg by mouth Daily.      . baclofen (LIORESAL) 10 MG tablet Take 10 mg by mouth 3 (three) times daily. Takes mostly 2 times daily      . benzonatate (TESSALON) 100 MG capsule Take 100 mg by mouth 3 (three) times daily as needed.      . cefdinir (OMNICEF) 300 MG capsule Take 300 mg by mouth 2 (two) times daily.      Marland Kitchen omeprazole (PRILOSEC) 20 MG capsule 1 po bid 30 minutes before meals FOREVER    . amoxicillin (AMOXIL) 500 MG capsule Take 500 mg by mouth 3 (three) times daily.       . isosorbide dinitrate (ISORDIL) 10 MG tablet Take 10 mg by mouth 2 (two) times daily.           Review of  Systems     Objective:   Physical Exam        Assessment & Plan:

## 2012-09-10 NOTE — Progress Notes (Signed)
Faxed to PCP

## 2012-09-10 NOTE — Patient Instructions (Signed)
HAPPY HOLIDAYS!  CONTINUE WEIGHT LOSS EFFORTS AFTER YOU FEEL BETTER.   CONTINUE THE SOFT MECHANICAL LOW FAT DIET.   CONTINUE BACLOFEN AND OMEPRAZOLE.  FOLLOW UP IN 6 MOS.

## 2012-09-10 NOTE — Assessment & Plan Note (Signed)
SX CONTROLLED.  CONTINUE WEIGHT LOSS EFFORTS.  CONTINUE THE SOFT MECHANICAL LOW FAT DIET.  CONTINUE BACLOFEN AND OMEPRAZOLE. REFILL IN SEP 2014. FOLLOW UP IN 6 MOS.

## 2012-09-16 NOTE — Progress Notes (Signed)
Reminder in epic to follow up in 6 months with SF °

## 2012-10-09 ENCOUNTER — Other Ambulatory Visit: Payer: Self-pay | Admitting: Neurosurgery

## 2012-10-09 DIAGNOSIS — M48061 Spinal stenosis, lumbar region without neurogenic claudication: Secondary | ICD-10-CM

## 2012-10-14 ENCOUNTER — Ambulatory Visit
Admission: RE | Admit: 2012-10-14 | Discharge: 2012-10-14 | Disposition: A | Discharge status: Home / Self Care | Payer: Medicare Other | Source: Ambulatory Visit | Attending: Neurosurgery | Admitting: Neurosurgery

## 2012-10-14 VITALS — BP 155/79 | HR 67

## 2012-10-14 DIAGNOSIS — M48061 Spinal stenosis, lumbar region without neurogenic claudication: Secondary | ICD-10-CM

## 2012-10-14 MED ORDER — METHYLPREDNISOLONE ACETATE 40 MG/ML INJ SUSP (RADIOLOG
120.0000 mg | Freq: Once | INTRAMUSCULAR | Status: AC
  Administered 2012-10-14: 120 mg via EPIDURAL

## 2012-10-14 MED ORDER — IOHEXOL 180 MG/ML  SOLN
1.0000 mL | Freq: Once | INTRAMUSCULAR | Status: AC | PRN
  Administered 2012-10-14: 1 mL via EPIDURAL

## 2012-10-14 NOTE — Progress Notes (Signed)
Patient states she pre-medicated with Benadryl 50mg  PO for iodinated contrast sensitivity.  jkl

## 2012-11-04 ENCOUNTER — Other Ambulatory Visit: Payer: Self-pay | Admitting: Neurosurgery

## 2012-11-04 DIAGNOSIS — M48061 Spinal stenosis, lumbar region without neurogenic claudication: Secondary | ICD-10-CM

## 2012-11-13 ENCOUNTER — Ambulatory Visit
Admission: RE | Admit: 2012-11-13 | Discharge: 2012-11-13 | Disposition: A | Discharge status: Home / Self Care | Payer: Medicare Other | Source: Ambulatory Visit | Attending: Neurosurgery | Admitting: Neurosurgery

## 2012-11-13 DIAGNOSIS — M48061 Spinal stenosis, lumbar region without neurogenic claudication: Secondary | ICD-10-CM

## 2012-11-13 MED ORDER — GADOBENATE DIMEGLUMINE 529 MG/ML IV SOLN
15.0000 mL | Freq: Once | INTRAVENOUS | Status: AC | PRN
  Administered 2012-11-13: 15 mL via INTRAVENOUS

## 2012-12-11 ENCOUNTER — Encounter: Payer: Self-pay | Admitting: Dermatology

## 2012-12-11 DIAGNOSIS — C4491 Basal cell carcinoma of skin, unspecified: Secondary | ICD-10-CM

## 2012-12-11 HISTORY — DX: Basal cell carcinoma of skin, unspecified: C44.91

## 2013-03-13 ENCOUNTER — Encounter: Payer: Self-pay | Admitting: Gastroenterology

## 2013-04-30 ENCOUNTER — Encounter: Payer: Self-pay | Admitting: Gastroenterology

## 2013-04-30 ENCOUNTER — Ambulatory Visit (INDEPENDENT_AMBULATORY_CARE_PROVIDER_SITE_OTHER): Payer: Medicare Other | Admitting: Gastroenterology

## 2013-04-30 VITALS — BP 120/70 | HR 75 | Temp 97.4°F | Ht 63.0 in | Wt 152.8 lb

## 2013-04-30 DIAGNOSIS — K219 Gastro-esophageal reflux disease without esophagitis: Secondary | ICD-10-CM

## 2013-04-30 MED ORDER — BACLOFEN 10 MG PO TABS: ORAL_TABLET | ORAL | Status: DC

## 2013-04-30 MED ORDER — OMEPRAZOLE 20 MG PO CPDR: DELAYED_RELEASE_CAPSULE | ORAL | Status: DC

## 2013-04-30 NOTE — Assessment & Plan Note (Signed)
Sx fairly well controlled.  REFILL OMEPRAZOLE AND BACLOFEN FOR 1 YEAR. LOW FAT DIET OPV IN 6 MOS

## 2013-04-30 NOTE — Patient Instructions (Signed)
CONTINUE YOUR WEIGHT LOSS EFFORTS.   CONTINUE THE SOFT MECHANICAL LOW FAT DIET.   CONTINUE BACLOFEN AND OMEPRAZOLE.   FOLLOW UP IN 6 MOS.

## 2013-04-30 NOTE — Progress Notes (Signed)
  Subjective:    Patient ID: Victoria Molina, female    DOB: January 11, 1946, 67 y.o.   MRN: [id number]  Elmo Putt, MD  HPI C/O BURNING WHEN SHE STARTS TO WALK. USUALLY IN THE AFTERNOON AROUND 6 PM. USU. TAKES BACLOFEN AND OMEPRAZOLE BEFORE SUPPER. USES TUMS BEFORE WALKING. STILL FEEL REAL BAD BETTER. AFTER 2 LAPS THE BURNING CEASES. EATS A LIGHT MEAL IN THE EVENING. COUGH AND SINUSES ARE BETTER. CHANGED EATING HABITS AND THINGS ARE BETTER. 8 LB WEIGHT LOSS. MAY TAKE A LAXATIVE: EPSON SALT ONCE EVERY 2 WEEKS. BM ONCE A DAY-LIGHT BROWN  PT DENIES FEVER, CHILLS, BRBPR, nausea, vomiting, melena, diarrhea, abd pain, problems swallowing.  Past Medical History  Diagnosis Date  . HTN (hypertension)   . GERD (gastroesophageal reflux disease)   . Colon polyps 2006, 2011    HYPERPLASTIC  . H pylori ulcer 2002,2006    AF THEN ABO, bX NEG 2011/2013    Past Surgical History  Procedure Laterality Date  . Upper gastrointestinal endoscopy    . Total abdominal hysterectomy    . Back surgery    . Colonoscopy  2006, 2011    HYPERPLASTIC POLYPS    Allergies  Allergen Reactions  . Iodinated Diagnostic Agents Hives and Itching    Pt pre-meds with Benadryl 50mg  PO; tolerates ESI's.  jkl  . Sulfa Antibiotics Hives and Itching  . Vioxx (Rofecoxib) Swelling    Swelling of legs and feet    Current Outpatient Prescriptions  Medication Sig Dispense Refill  . ALPRAZolam (XANAX) 0.5 MG tablet Take 0.5 mg by mouth as needed.      Marland Kitchen AMLODIPINE BESYLATE PO Take 5 mg by mouth Daily.      Marland Kitchen gabapentin (NEURONTIN) 300 MG capsule Take 300 mg by mouth daily.   IN THE AFTERNOON    . isosorbide dinitrate (ISORDIL) 10 MG tablet Take 10 mg by mouth 2 (two) times daily.       Marland Kitchen omeprazole (PRILOSEC) 20 MG capsule 1 po bid 30 minutes before meals FOREVER    .        Marland Kitchen baclofen (LIORESAL) 10 MG tablet Take 10 mg by mouth 3 (three) times daily. Takes mostly 2 times daily      . benzonatate (TESSALON) 100 MG capsule  Take 100 mg by mouth 3 (three) times daily as needed.      .            Review of Systems     Objective:   Physical Exam  Vitals reviewed. Constitutional: She is oriented to person, place, and time. She appears well-nourished. No distress.  HENT:  Head: Normocephalic and atraumatic.  Mouth/Throat: Oropharynx is clear and moist. No oropharyngeal exudate.  Eyes: Pupils are equal, round, and reactive to light. No scleral icterus.  Neck: Normal range of motion. Neck supple.  Cardiovascular: Normal rate, regular rhythm and normal heart sounds.   Pulmonary/Chest: Effort normal and breath sounds normal. No respiratory distress.  Abdominal: Soft. Bowel sounds are normal. She exhibits no distension. There is no tenderness.  Musculoskeletal: She exhibits no edema.  Lymphadenopathy:    She has no cervical adenopathy.  Neurological: She is alert and oriented to person, place, and time.  NO FOCAL DEFICITS   Psychiatric: She has a normal mood and affect.          Assessment & Plan:

## 2013-04-30 NOTE — Progress Notes (Signed)
CC PCP 

## 2013-05-04 NOTE — Progress Notes (Signed)
REMINDER OV MADE IN EPIC

## 2013-05-27 ENCOUNTER — Other Ambulatory Visit: Payer: Self-pay

## 2013-06-25 ENCOUNTER — Encounter: Payer: Self-pay | Admitting: Gastroenterology

## 2013-06-25 ENCOUNTER — Ambulatory Visit (INDEPENDENT_AMBULATORY_CARE_PROVIDER_SITE_OTHER): Payer: Medicare Other | Admitting: Gastroenterology

## 2013-06-25 ENCOUNTER — Other Ambulatory Visit: Payer: Self-pay | Admitting: Gastroenterology

## 2013-06-25 VITALS — BP 122/70 | HR 70 | Temp 97.9°F | Ht 63.0 in | Wt 152.4 lb

## 2013-06-25 DIAGNOSIS — R1310 Dysphagia, unspecified: Secondary | ICD-10-CM | POA: Insufficient documentation

## 2013-06-25 DIAGNOSIS — K3189 Other diseases of stomach and duodenum: Secondary | ICD-10-CM

## 2013-06-25 DIAGNOSIS — R1013 Epigastric pain: Secondary | ICD-10-CM | POA: Insufficient documentation

## 2013-06-25 NOTE — Progress Notes (Signed)
Subjective:    Patient ID: Victoria Molina, female    DOB: [birth year], 67 y.o.   MRN: [id number]  Elmo Putt, MD  HPI Feels like she has a bubble in her middle of her chest. Started Dexilant and sx better. WHEN SHE BURPS FOOD MAY COME BACK UP. SX MOST OF THE TIME. STARTS WITH BURPING AND THE THE BUBBLE FEELING. WHEN SHE EATS FEELS LIKE ITS THERE. NO TROUBLE SWALLOWING. DRINKS TEA-CAFFEINE FROM [employer]. DOESN'T CHEW GUM OR PEPPERMINT. PAIN FOR SHOULDER MADE HER SICK FOR 2 WEEKS. EVERY TIME SHE EATS STOMACH GETS UPSET. HAS PAIN IN RUQ AFTER TAKING PAIN PILL. EASING OFF. LOST 8 LBS SINCE JUL 2013 INTENTIONALLY. RARE CONSTIPATION. MAY FEEL BURNING IN CHEST WHEN SHE'S EXERCISING.  PT DENIES FEVER, CHILLS, BRBPR, nausea, vomiting, melena, OR problems swallowing.  Past Medical History  Diagnosis Date  . HTN (hypertension)   . GERD (gastroesophageal reflux disease)   . Colon polyps 2006, 2011    HYPERPLASTIC  . H pylori ulcer 2002,2006    AF THEN ABO, bX NEG 2011/2013   Past Surgical History  Procedure Laterality Date  . Upper gastrointestinal endoscopy    . Total abdominal hysterectomy    . Back surgery    . Colonoscopy  2006, 2011    HYPERPLASTIC POLYPS   Allergies  Allergen Reactions  . Iodinated Diagnostic Agents Hives and Itching    Pt pre-meds with Benadryl 50mg  PO; tolerates ESI's.  jkl  . Sulfa Antibiotics Hives and Itching  . Vioxx [Rofecoxib] Swelling    Swelling of legs and feet    Current Outpatient Prescriptions  Medication Sig Dispense Refill  . ALPRAZolam (XANAX) 0.5 MG tablet Take 0.5 mg by mouth as needed.      Marland Kitchen AMLODIPINE BESYLATE PO Take 5 mg by mouth Daily.      Marland Kitchen dexlansoprazole (DEXILANT) 60 MG capsule Take 60 mg by mouth daily.      .      .      . baclofen (LIORESAL) 10 MG tablet Takes 1 PO QAC NOT TAKING   . benzonatate (TESSALON) 100 MG capsule Take 100 mg by mouth 3 (three) times daily as needed.    .      . gabapentin (NEURONTIN) 300 MG  capsule Take 300 mg by mouth daily.       . isosorbide dinitrate (ISORDIL) 10 MG tablet Take 10 mg by mouth 2 (two) times daily.            Review of Systems     Objective:   Physical Exam  Vitals reviewed. Constitutional: She is oriented to person, place, and time. She appears well-nourished. No distress.  HENT:  Head: Normocephalic and atraumatic.  Mouth/Throat: Oropharynx is clear and moist. No oropharyngeal exudate.  Eyes: Pupils are equal, round, and reactive to light. No scleral icterus.  Neck: Normal range of motion. Neck supple.  Cardiovascular: Normal rate, regular rhythm and normal heart sounds.   Pulmonary/Chest: Effort normal and breath sounds normal. No respiratory distress.  Abdominal: Soft. Bowel sounds are normal. She exhibits no distension. There is tenderness. There is no rebound and no guarding.  MILD RUQ TTP  Musculoskeletal: Normal range of motion. She exhibits no edema.  Lymphadenopathy:    She has no cervical adenopathy.  Neurological: She is alert and oriented to person, place, and time.  NO FOCAL DEFICITS   Psychiatric:  FLAT AFFECT, SLIGHTLY ANXIOUS MOOD  Assessment & Plan:

## 2013-06-25 NOTE — Progress Notes (Signed)
Reminder in epic °

## 2013-06-25 NOTE — Assessment & Plan Note (Addendum)
LAST EGD 2013. EGD/TCS UTD-SX IMPROVED BUT NOT RESOLVED WITH DEXILANT. DIFFERENTIAL DIAGNOSIS INCLUDES ACHALASIA, NON-ULCER DYSPEPSIA, UNCONTROLLED GERD, AND LESS LIKELY ESO OR GE JXN CA.  BPE ASAP. IF ABNL CONSIDER EGD OR MANOMETRY FULL LIQUID DIET FOR 24-48 HRS IF SX FLARE CONTINUE DEXILANT SOFT MECHANICAL DIET OPV IN 3 MOS

## 2013-06-25 NOTE — Assessment & Plan Note (Signed)
EGD/TCS UTD-SX IMPROVED BUT NOT RESOLVED WITH DEXILANT. DIFFERENTIAL DIAGNOSIS INCLUDES ACHALASIA, NON-ULCER DYSPEPSIA, UNCONTROLLED GERD, AND LESS LIKELY ESO OR GE JXN CA.  BPE ASAP. IF ABNL CONSIDER EGD OR MANOMETRY FULL LIQUID DIET FOR 24-48 HRS IF SX FLARE CONTINUE DEXILANT SOFT MECHANICAL DIET OPV IN 3 MOS

## 2013-06-25 NOTE — Patient Instructions (Signed)
COMPLETE BARIUM SWALLOWING TEST.  FOLLOW A LOW FAT/SOFT MECHANICAL DIET. SEE INFO BELOW.  WHEN YOU HAVE A FLARE FOLLOW A FULL LIQUID DIET FOR 24-48 HRS. SEE INFO BELOW.   AVOID DAIRY PRODUCTS.  CONTINUE DEXILANT  FOLLOW UP IN 3 MOS.    Full Liquid Diet A high-calorie, high-protein supplement should be used to meet your nutritional requirements when the full liquid diet is continued for more than 2 or 3 days. If this diet is to be used for an extended period of time (more than 7 days), a multivitamin should be considered.  Breads and Starches  Allowed: None are allowed except crackersWHOLE OR pureed (made into a thick, smooth soup) in soup. Cooked, refined corn, oat, rice, rye, and wheat cereals are also allowed.   Avoid: Any others.    Potatoes/Pasta/Rice  Allowed: ANY ITEM AS A SOUP OR SMALL PLATE OF MASHED POTATOES OR RICE.       Vegetables  Allowed: Strained tomato or vegetable juice. Vegetables pureed in soup.   Avoid: Any others.    Fruit  Allowed: Any strained fruit juices and fruit drinks. Include 1 serving of citrus or vitamin C-enriched fruit juice daily.   Avoid: Any others.  Meat and Meat Substitutes  Allowed: Egg  Avoid: Any meat, fish, or fowl. All cheese.  Milk  Allowed: SOY Milk beverages, including milk shakes and instant breakfast mixes. Smooth yogurt.   Avoid: Any others. Avoid dairy products if not tolerated.    Soups and Combination Foods  Allowed: Broth, strained cream soups. Strained, broth-based soups.   Avoid: Any others.    Desserts and Sweets  Allowed: flavored gelatin, tapioca, plain LACTOSE FREE ice cream, sherbet, smooth pudding, junket, fruit ices, frozen ice pops, pudding pops,, frozen fudge pops, chocolate syrup. Sugar, honey, jelly, syrup.   Avoid: Any others.  Fats and Oils  Allowed: Margarine, butter, cream, sour cream, oils.   Avoid: Any others.  Beverages  Allowed: All.   Avoid: None.   Condiments  Allowed: Iodized salt, pepper, spices, flavorings. Cocoa powder.   Avoid: Any others.    SAMPLE MEAL PLAN Breakfast   cup orange juice.   1 cup cooked wheat cereal.   1 cup SOY milk.   1 cup beverage (coffee or tea).   Cream or sugar, if desired.    Midmorning Snack  2 SCRAMBLED OR HARD BOILED EGG   Lunch  1 cup cream soup.    cup fruit juice.   1 cup SOY milk.    cup custard.   1 cup beverage (coffee or tea).   Cream or sugar, if desired.    Midafternoon Snack  1 cup VANILLA SOY milk shake.  Dinner  1 cup cream soup.    cup fruit juice.   1 cup SOY milk.    cup pudding.   1 cup beverage (coffee or tea).   Cream or sugar, if desired.  Evening Snack  1 cup supplement.  To increase calories, add sugar, cream, butter, or margarine if possible. Nutritional supplements will also increase the total calories.     SOFT MECHANICAL DIET This SOFT MECHANICAL DIET is restricted to:  Foods that are moist, soft-textured, and easy to chew and swallow. MEATS SHOULD BE CHOPPED OR GROUND.  Meats that are ground or are minced no larger than one-quarter inch pieces. Meats are moist with gravy or sauce added.   Foods that do not include bread or bread-like textures except soft pancakes, well-moistened with syrup or sauce.  Textures with some chewing ability required.   Casseroles without rice.   Cooked vegetables that are less than half an inch in size and easily mashed with a fork. No cooked corn, peas, broccoli, cauliflower, cabbage, Brussels sprouts, asparagus, or other fibrous, non-tender or rubbery cooked vegetables.   Canned fruit except for pineapple. Fruit must be cut into pieces no larger than half an inch in size.   Foods that do not include nuts, seeds, coconut, or sticky textures.    FOOD TEXTURES FOR DYSPHAGIA DIET LEVEL 2 -SOFT MECHANICAL DIET (includes all foods on Dysphagia Diet Level 1 - Pureed, in addition to the  foods listed below)  FOOD GROUP: Breads. RECOMMENDED: Soft pancakes, well-moistened with syrup or sauce. AVOID: All others.  FOOD GROUP: Cereals. RECOMMENDED: Cooked cereals with little texture, including oatmeal. Unprocessed wheat bran stirred into cereals for bulk. Note: If thin liquids are restricted, it is important that all of the liquid is absorbed into the cereal. AVOID: All dry cereals and any cooked cereals that may contain flax seeds or other seeds or nuts. Whole-grain, dry, or coarse cereals. Cereals with nuts, seeds, dried fruit, and/or coconut.  FOOD GROUP: Desserts. RECOMMENDED: Pudding, custard. Soft fruit pies with bottom crust only. Canned fruit (excluding pineapple). Soft, moist cakes with icing.Frozen malts, milk shakes, frozen yogurt, eggnog, nutritional supplements, ice cream, sherbet, regular or sugar-free gelatin, or any foods that become thin liquid at either room (70 F) or body temperature (98 F). AVOID: Dry, coarse cakes and cookies. Anything with nuts, seeds, coconut, pineapple, or dried fruit. Breakfast yogurt with nuts. Rice or bread pudding.  FOOD GROUP: Fats. RECOMMENDED: Butter, margarine, cream for cereal (depending on liquid consistency recommendations), gravy, cream sauces, sour cream, sour cream dips with soft additives, mayonnaise, salad dressings, cream cheese, cream cheese spreads with soft additives, whipped toppings. AVOID: All fats with coarse or chunky additives.  FOOD GROUP: Fruits. RECOMMENDED: Soft drained, canned, or cooked fruits without seeds or skin. Fresh soft and ripe banana. Fruit juices with a small amount of pulp. If thin liquids are restricted, fruit juices should be thickened to appropriate consistency. AVOID: Fresh or frozen fruits. Cooked fruit with skin or seeds. Dried fruits. Fresh, canned, or cooked pineapple.  FOOD GROUP: Meats and Meat Substitutes. (Meat pieces should not exceed 1/4 of an inch cube and should be  tender.) RECOMMENDED: Moistened ground or cooked meat, poultry, or fish. Moist ground or tender meat may be served with gravy or sauce. Casseroles without rice. Moist macaroni and cheese, well-cooked pasta with meat sauce, tuna noodle casserole, soft, moist lasagna. Moist meatballs, meatloaf, or fish loaf. Protein salads, such as tuna or egg without large chunks, celery, or onion. Cottage cheese, smooth quiche without large chunks. Poached, scrambled, or soft-cooked eggs (egg yolks should not be "runny" but should be moist and able to be mashed with butter, margarine, or other moisture added to them). (Cook eggs to 160 F or use pasteurized eggs for safety.) Souffls may have small, soft chunks. Tofu. Well-cooked, slightly mashed, moist legumes, such as baked beans. All meats or protein substitutes should be served with sauces or moistened to help maintain cohesiveness in the oral cavity. AVOID: Dry meats, tough meats (such as bacon, sausage, hot dogs, bratwurst). Dry casseroles or casseroles with rice or large chunks. Peanut butter. Cheese slices and cubes. Hard-cooked or crisp fried eggs. Sandwiches.Pizza.  FOOD GROUP: Potatoes and Starches. RECOMMENDED: Well-cooked, moistened, boiled, baked, or mashed potatoes. Well-cooked shredded hash brown potatoes that are not  crisp. (All potatoes need to be moist and in sauces.)Well-cooked noodles in sauce. Spaetzel or soft dumplings that have been moistened with butter or gravy. AVOID: Potato skins and chips. Fried or French-fried potatoes. Rice.  FOOD GROUP: Soups. RECOMMENDED: Soups with easy-to-chew or easy-to-swallow meats or vegetables: Particle sizes in soups should be less than 1/2 inch. Soups will need to be thickened to appropriate consistency if soup is thinner than prescribed liquid consistency. AVOID: Soups with large chunks of meat and vegetables. Soups with rice, corn, peas.  FOOD GROUP: Vegetables. RECOMMENDED: All soft, well-cooked vegetables.  Vegetables should be less than a half inch. Should be easily mashed with a fork. AVOID: Cooked corn and peas. Broccoli, cabbage, Brussels sprouts, asparagus, or other fibrous, non-tender or rubbery cooked vegetables.  FOOD GROUP: Miscellaneous. RECOMMENDED: Jams and preserves without seeds, jelly. Sauces, salsas, etc., that may have small tender chunks less than 1/2 inch. Soft, smooth chocolate bars that are easily chewed. AVOID: Seeds, nuts, coconut, or sticky foods. Chewy candies such as caramels or licorice.      Low-Fat Diet BREADS, CEREALS, PASTA, RICE, DRIED PEAS, AND BEANS These products are high in carbohydrates and most are low in fat. Therefore, they can be increased in the diet as substitutes for fatty foods. They too, however, contain calories and should not be eaten in excess. Cereals can be eaten for snacks as well as for breakfast.   FRUITS AND VEGETABLES It is good to eat fruits and vegetables. Besides being sources of fiber, both are rich in vitamins and some minerals. They help you get the daily allowances of these nutrients. Fruits and vegetables can be used for snacks and desserts.  MEATS Limit lean meat, chicken, [place], and fish to no more than 6 ounces per day. Beef, Pork, and Lamb Use lean cuts of beef, pork, and lamb. Lean cuts include:  Extra-lean ground beef.  Arm roast.  Sirloin tip.  Center-cut ham.  Round steak.  Loin chops.  Rump roast.  Tenderloin.  Trim all fat off the outside of meats before cooking. It is not necessary to severely decrease the intake of red meat, but lean choices should be made. Lean meat is rich in protein and contains a highly absorbable form of iron. Premenopausal women, in particular, should avoid reducing lean red meat because this could increase the risk for low red blood cells (iron-deficiency anemia).  Chicken and [place] These are good sources of protein. The fat of poultry can be reduced by removing the skin and underlying  fat layers before cooking. Chicken and [place] can be substituted for lean red meat in the diet. Poultry should not be fried or covered with high-fat sauces. Fish and Shellfish Fish is a good source of protein. Shellfish contain cholesterol, but they usually are low in saturated fatty acids. The preparation of fish is important. Like chicken and [place], they should not be fried or covered with high-fat sauces. EGGS Egg whites contain no fat or cholesterol. They can be eaten often. Try 1 to 2 egg whites instead of whole eggs in recipes or use egg substitutes that do not contain yolk. MILK AND DAIRY PRODUCTS Use skim or 1% milk instead of 2% or whole milk. Decrease whole milk, natural, and processed cheeses. Use nonfat or low-fat (2%) cottage cheese or low-fat cheeses made from vegetable oils. Choose nonfat or low-fat (1 to 2%) yogurt. Experiment with evaporated skim milk in recipes that call for heavy cream. Substitute low-fat yogurt or low-fat cottage cheese for  sour cream in dips and salad dressings. Have at least 2 servings of low-fat dairy products, such as 2 glasses of skim (or 1%) milk each day to help get your daily calcium intake. FATS AND OILS Reduce the total intake of fats, especially saturated fat. Butterfat, lard, and beef fats are high in saturated fat and cholesterol. These should be avoided as much as possible. Vegetable fats do not contain cholesterol, but certain vegetable fats, such as coconut oil, palm oil, and palm kernel oil are very high in saturated fats. These should be limited. These fats are often used in bakery goods, processed foods, popcorn, oils, and nondairy creamers. Vegetable shortenings and some peanut butters contain hydrogenated oils, which are also saturated fats. Read the labels on these foods and check for saturated vegetable oils. Unsaturated vegetable oils and fats do not raise blood cholesterol. However, they should be limited because they are fats and are high in  calories. Total fat should still be limited to 30% of your daily caloric intake. Desirable liquid vegetable oils are corn oil, cottonseed oil, olive oil, canola oil, safflower oil, soybean oil, and sunflower oil. Peanut oil is not as good, but small amounts are acceptable. Buy a heart-healthy tub margarine that has no partially hydrogenated oils in the ingredients. Mayonnaise and salad dressings often are made from unsaturated fats, but they should also be limited because of their high calorie and fat content. Seeds, nuts, peanut butter, olives, and avocados are high in fat, but the fat is mainly the unsaturated type. These foods should be limited mainly to avoid excess calories and fat. OTHER EATING TIPS Snacks  Most sweets should be limited as snacks. They tend to be rich in calories and fats, and their caloric content outweighs their nutritional value. Some good choices in snacks are graham crackers, melba toast, soda crackers, bagels (no egg), English muffins, fruits, and vegetables. These snacks are preferable to snack crackers, [place] fries, TORTILLA CHIPS, and POTATO chips. Popcorn should be air-popped or cooked in small amounts of liquid vegetable oil. Desserts Eat fruit, low-fat yogurt, and fruit ices instead of pastries, cake, and cookies. Sherbet, angel food cake, gelatin dessert, frozen low-fat yogurt, or other frozen products that do not contain saturated fat (pure fruit juice bars, frozen ice pops) are also acceptable.  COOKING METHODS Choose those methods that use little or no fat. They include: Poaching.  Braising.  Steaming.  Grilling.  Baking.  Stir-frying.  Broiling.  Microwaving.  Foods can be cooked in a nonstick pan without added fat, or use a nonfat cooking spray in regular cookware. Limit fried foods and avoid frying in saturated fat. Add moisture to lean meats by using water, broth, cooking wines, and other nonfat or low-fat sauces along with the cooking methods mentioned  above. Soups and stews should be chilled after cooking. The fat that forms on top after a few hours in the refrigerator should be skimmed off. When preparing meals, avoid using excess salt. Salt can contribute to raising blood pressure in some people.  EATING AWAY FROM HOME Order entres, potatoes, and vegetables without sauces or butter. When meat exceeds the size of a deck of cards (3 to 4 ounces), the rest can be taken home for another meal. Choose vegetable or fruit salads and ask for low-calorie salad dressings to be served on the side. Use dressings sparingly. Limit high-fat toppings, such as bacon, crumbled eggs, cheese, sunflower seeds, and olives. Ask for heart-healthy tub margarine instead of butter.

## 2013-06-26 ENCOUNTER — Ambulatory Visit (HOSPITAL_COMMUNITY)
Admission: RE | Admit: 2013-06-26 | Discharge: 2013-06-26 | Disposition: A | Discharge status: Home / Self Care | Payer: Medicare Other | Source: Ambulatory Visit | Attending: Gastroenterology | Admitting: Gastroenterology

## 2013-06-26 DIAGNOSIS — R1013 Epigastric pain: Secondary | ICD-10-CM

## 2013-06-26 DIAGNOSIS — K224 Dyskinesia of esophagus: Secondary | ICD-10-CM | POA: Insufficient documentation

## 2013-06-26 DIAGNOSIS — R1310 Dysphagia, unspecified: Secondary | ICD-10-CM

## 2013-06-29 NOTE — Progress Notes (Signed)
CC'd to PCP 

## 2013-07-02 NOTE — Progress Notes (Addendum)
PLEASE CALL PT. HER SWALLOWING STUDY SHOWS A WEAK ESOPHAGUS AND REFLUX. SHE SHOULD:  1. STRICTLY ADHERE TO A LOW FAT/SOFT MECHANICAL DIET 2. FOLLOW A FULL LIQUID DIET FOR 24-48 HRS IF SHE HAS A REFLUX FLARE  3. CONTINUE DEXILANT  4. OPV IN 3 MOS E30 DYSPEPSIA/DYSPHAGIA  SHE DOES NOT NEED AN ENDOSCOPY AT THIS TIME.

## 2013-07-06 NOTE — Progress Notes (Signed)
Called and informed pt. She said she takes omeprazole 20 mg bid. She only took Dexilant a couple of times from a friend.

## 2013-07-06 NOTE — Progress Notes (Signed)
PLEASE CALL PT. SHE SHOULD CONTINUE OMEPRAZOLE 30 mins prior to meals bid.

## 2013-07-06 NOTE — Addendum Note (Signed)
Addended by: [place] on: 07/06/2013 03:44 PM   Modules accepted: Orders

## 2013-07-08 NOTE — Progress Notes (Signed)
LMOM for a return call.  

## 2013-07-09 NOTE — Progress Notes (Signed)
Pt returned call and was informed. She said she is having a bad flare now and the Omeprazole bid is not helping and wants to know if Dr. Evelina Dun would recommend something else.

## 2013-07-16 NOTE — Progress Notes (Signed)
Routing to Dr. Fields to advise! 

## 2013-07-16 NOTE — Progress Notes (Addendum)
PLEASE CALL PT. HER SWALLOWING STUDY SHOWS A WEAK ESOPHAGUS AND REFLUX. Since her sx are not well controlled she should have an upper endoscopy ON MON SEP 29 to evaluate for other causes for her sx. SHE NEEDS PHENERGAN 12.5 MG IV IN PREOP.  AS WELL SHE SHOULD:  1. FOLLOW A FULL LIQUID DIET FOR 24-48 HRS IF SHE HAS A REFLUX FLARE. OTHERWISE SHE SHOULD STR ICTLY ADHERE TO A LOW FAT/SOFT MECHANICAL DIET 2. USE BACLOFEN 30 MINS PRIOR TO MEALS THREE TIMES A DAY AND AT BEDTIME. 3. CONTINUE OMEPRAZOLE. 4. OPV IN 3 MOS E30 DYSPEPSIA/DYSPHAGIA

## 2013-07-16 NOTE — Progress Notes (Signed)
LMOM for a return call.  

## 2013-07-16 NOTE — Progress Notes (Signed)
Called and informed pt. She said she only took Dexilant from a friend who gave her a box, and it seemed to help. She thinks it might be too expensive, but the omeprazole bid is not helping a lot now and she would like to know what she should try. Please advise!

## 2013-07-24 MED ORDER — RABEPRAZOLE SODIUM 20 MG PO TBEC: DELAYED_RELEASE_TABLET | ORAL | Status: DC

## 2013-07-24 NOTE — Addendum Note (Signed)
Addended by: [place] on: 07/24/2013 11:43 AM   Modules accepted: Orders, Medications

## 2013-07-24 NOTE — Progress Notes (Addendum)
PLEASE CALL PT. SHE STOP OMEPRAZOLE AND START ACIPHEX. RX SENT. FOLLOW A LOW FAT DIET.

## 2013-07-24 NOTE — Progress Notes (Signed)
Called and left the message of the info on VM.

## 2013-07-27 ENCOUNTER — Other Ambulatory Visit: Payer: Self-pay

## 2013-07-27 NOTE — Progress Notes (Signed)
LMOM to call.

## 2013-07-28 NOTE — Progress Notes (Signed)
Pt returned call and was informed. She said she had found out about the Aciphex, but pharmacist said they had to fax over paperwork per Raynelle Fanning, she has received it.

## 2013-07-29 NOTE — Telephone Encounter (Signed)
This was done 07/24/13.

## 2013-08-27 ENCOUNTER — Other Ambulatory Visit: Payer: Self-pay | Admitting: Neurology

## 2013-08-27 ENCOUNTER — Other Ambulatory Visit: Payer: Self-pay

## 2013-08-27 DIAGNOSIS — H9202 Otalgia, left ear: Secondary | ICD-10-CM

## 2013-08-27 DIAGNOSIS — G44009 Cluster headache syndrome, unspecified, not intractable: Secondary | ICD-10-CM

## 2013-09-07 ENCOUNTER — Ambulatory Visit
Admission: RE | Admit: 2013-09-07 | Discharge: 2013-09-07 | Disposition: A | Discharge status: Home / Self Care | Payer: Medicare Other | Source: Ambulatory Visit | Attending: Neurology | Admitting: Neurology

## 2013-09-07 DIAGNOSIS — H9202 Otalgia, left ear: Secondary | ICD-10-CM

## 2013-09-07 DIAGNOSIS — G44009 Cluster headache syndrome, unspecified, not intractable: Secondary | ICD-10-CM

## 2013-09-18 ENCOUNTER — Other Ambulatory Visit: Payer: Medicare Other

## 2013-09-23 ENCOUNTER — Encounter: Payer: Self-pay | Admitting: Gastroenterology

## 2013-10-06 ENCOUNTER — Ambulatory Visit: Payer: Self-pay | Admitting: Neurology

## 2013-10-07 ENCOUNTER — Ambulatory Visit: Payer: Medicare Other | Admitting: Gastroenterology

## 2013-10-21 ENCOUNTER — Other Ambulatory Visit: Payer: Self-pay | Admitting: Neurosurgery

## 2013-10-21 DIAGNOSIS — M4316 Spondylolisthesis, lumbar region: Secondary | ICD-10-CM

## 2013-11-05 ENCOUNTER — Ambulatory Visit (INDEPENDENT_AMBULATORY_CARE_PROVIDER_SITE_OTHER): Payer: Medicare Other | Admitting: Gastroenterology

## 2013-11-05 ENCOUNTER — Encounter: Payer: Self-pay | Admitting: Gastroenterology

## 2013-11-05 VITALS — BP 131/78 | HR 64 | Temp 98.3°F | Wt 152.2 lb

## 2013-11-05 DIAGNOSIS — K5900 Constipation, unspecified: Secondary | ICD-10-CM

## 2013-11-05 DIAGNOSIS — K219 Gastro-esophageal reflux disease without esophagitis: Secondary | ICD-10-CM

## 2013-11-05 DIAGNOSIS — R1013 Epigastric pain: Secondary | ICD-10-CM

## 2013-11-05 DIAGNOSIS — K3189 Other diseases of stomach and duodenum: Secondary | ICD-10-CM

## 2013-11-05 DIAGNOSIS — R1310 Dysphagia, unspecified: Secondary | ICD-10-CM

## 2013-11-05 MED ORDER — LINACLOTIDE 145 MCG PO CAPS
ORAL_CAPSULE | ORAL | Status: DC
Start: 2013-11-05 — End: 2013-11-12

## 2013-11-05 NOTE — Assessment & Plan Note (Signed)
ASSOCIATED WITH RUQ/EPIGASTRIC DISCOMFORT. RARE NAUSEA/VOMITING. X FAIRLY WELL CONTROLLED.  CONTINUE TO MONITOR SYMPTOMS. FOLLOW UP IN 4 MOS.

## 2013-11-05 NOTE — Assessment & Plan Note (Signed)
DUE TO IDIOPATHIC V. IBS-C.  ADD LINZESS 145 MCG DAILY DRINK WATER TO KEEP YOUR URINE LIGHT YELLOW. FOLLOW A HIGH FIBER DIET. SEE INFO BELOW. FOLLOW UP IN 4 MOS.

## 2013-11-05 NOTE — Assessment & Plan Note (Signed)
SX CONTROLLED.  CONTINUE TO MONITOR SYMPTOMS. LOW FAT/SOFT MECH DIET OPV IN 4 MOS

## 2013-11-05 NOTE — Assessment & Plan Note (Signed)
SX FAIRLY WELL CONTROLLED.  CONTINUE OMEPRAZOLE.  TAKE 30 MINUTES PRIOR TO YOUR MEALS TWICE DAILY. LOW FAT DIET FOLLOW UP IN 4 MOS.

## 2013-11-05 NOTE — Progress Notes (Signed)
Reminder in epic °

## 2013-11-05 NOTE — Progress Notes (Signed)
   Subjective:    Patient ID: Victoria Molina, female    DOB: 17-May-1946, 68 y.o.   MRN: [id number]  Lottie Mussel, MD  HPI HEARTBURN SOME: NOT AS BAD. HAS FULLNESS IN EPIGASTRIUM. "HAS A SPARE TIRE". CAN HAVE HARD STOOLS. WONDERS IF SHE MIGHT HAVE IBS-C. MAY HAVE A HARD PAIN IN RUQ ASSOCIATED WITH NAUSEA/VOMITING AFTERWARDS. PAIN AFTER SHE EATS: 2-3X/WEEK. TRIED SML MEALS. PT DENIES FEVER, CHILLS, BRBPR, melena, diarrhea, problems swallowing, problems with sedation, OR indigestion.  Past Medical History  Diagnosis Date  . HTN (hypertension)   . GERD (gastroesophageal reflux disease)   . Colon polyps 2006, 2011    HYPERPLASTIC  . H pylori ulcer 2002,2006    AF THEN ABO, bX NEG 2011/2013   Past Surgical History  Procedure Laterality Date  . Upper gastrointestinal endoscopy    . Total abdominal hysterectomy    . Back surgery    . Colonoscopy  2006, 2011    HYPERPLASTIC POLYPS   Allergies  Allergen Reactions  . Iodinated Diagnostic Agents Hives and Itching    Pt pre-meds with Benadryl 50mg  PO; tolerates ESI's.  jkl  . Sulfa Antibiotics Hives and Itching  . Vioxx [Rofecoxib] Swelling    Swelling of legs and feet   Current Outpatient Prescriptions  Medication Sig Dispense Refill  . ALPRAZolam (XANAX) 0.5 MG tablet Take 0.5 mg by mouth as needed.      Marland Kitchen AMLODIPINE BESYLATE PO Take 5 mg by mouth Daily.      .        . baclofen (LIORESAL) 10 MG tablet Takes 1 PO QAC BID   . benzonatate (TESSALON) 100 MG capsule Take 100 mg by mouth 3 (three) times daily as needed.      .        . OMEPRAZOLE BID      . isosorbide dinitrate (ISORDIL) 10 MG tablet Take 10 mg by mouth 2 (two) times daily.       .           Review of Systems     Objective:   Physical Exam  Vitals reviewed. Constitutional: She is oriented to person, place, and time. She appears well-nourished. No distress.  HENT:  Head: Normocephalic and atraumatic.  Mouth/Throat: Oropharynx is clear and moist. No  oropharyngeal exudate.  Eyes: Pupils are equal, round, and reactive to light. No scleral icterus.  Neck: Normal range of motion. Neck supple.  Cardiovascular: Normal rate, regular rhythm and normal heart sounds.   Pulmonary/Chest: Effort normal and breath sounds normal. No respiratory distress.  Abdominal: Soft. Bowel sounds are normal. She exhibits no distension. There is tenderness. There is no rebound and no guarding.  MILD PERI-UMBILICAL TTP on left  Musculoskeletal: She exhibits no edema.  Lymphadenopathy:    She has no cervical adenopathy.  Neurological: She is alert and oriented to person, place, and time.  NO FOCAL DEFICITS   Psychiatric: She has a normal mood and affect.          Assessment & Plan:

## 2013-11-05 NOTE — Patient Instructions (Addendum)
ADD LINZESS 145 MCG DAILY. IT MAY CAUSE EXPLOSIVE DIARRHEA.  DRINK WATER TO KEEP YOUR URINE LIGHT YELLOW.  FOLLOW A HIGH FIBER DIET. SEE INFO BELOW.  FOLLOW UP IN 4 MOS.   High-Fiber Diet A high-fiber diet changes your normal diet to include more whole grains, legumes, fruits, and vegetables. Changes in the diet involve replacing refined carbohydrates with unrefined foods. The calorie level of the diet is essentially unchanged. The Dietary Reference Intake (recommended amount) for adult males is 38 grams per day. For adult females, it is 25 grams per day. Pregnant and lactating women should consume 28 grams of fiber per day. Fiber is the intact part of a plant that is not broken down during digestion. Functional fiber is fiber that has been isolated from the plant to provide a beneficial effect in the body. PURPOSE  Increase stool bulk.   Ease and regulate bowel movements.   Lower cholesterol.  INDICATIONS THAT YOU NEED MORE FIBER  Constipation and hemorrhoids.   Uncomplicated diverticulosis (intestine condition) and irritable bowel syndrome.   Weight management.   As a protective measure against hardening of the arteries (atherosclerosis), diabetes, and cancer.   GUIDELINES FOR INCREASING FIBER IN THE DIET  Start adding fiber to the diet slowly. A gradual increase of about 5 more grams (2 slices of whole-wheat bread, 2 servings of most fruits or vegetables, or 1 bowl of high-fiber cereal) per day is best. Too rapid an increase in fiber may result in constipation, flatulence, and bloating.   Drink enough water and fluids to keep your urine clear or pale yellow. Water, juice, or caffeine-free drinks are recommended. Not drinking enough fluid may cause constipation.   Eat a variety of high-fiber foods rather than one type of fiber.   Try to increase your intake of fiber through using high-fiber foods rather than fiber pills or supplements that contain small amounts of fiber.   The  goal is to change the types of food eaten. Do not supplement your present diet with high-fiber foods, but replace foods in your present diet.  INCLUDE A VARIETY OF FIBER SOURCES  Replace refined and processed grains with whole grains, canned fruits with fresh fruits, and incorporate other fiber sources. White rice, white breads, and most bakery goods contain little or no fiber.   Brown whole-grain rice, buckwheat oats, and many fruits and vegetables are all good sources of fiber. These include: broccoli, Brussels sprouts, cabbage, cauliflower, beets, sweet potatoes, white potatoes (skin on), carrots, tomatoes, eggplant, squash, berries, fresh fruits, and dried fruits.   Cereals appear to be the richest source of fiber. Cereal fiber is found in whole grains and bran. Bran is the fiber-rich outer coat of cereal grain, which is largely removed in refining. In whole-grain cereals, the bran remains. In breakfast cereals, the largest amount of fiber is found in those with "bran" in their names. The fiber content is sometimes indicated on the label.   You may need to include additional fruits and vegetables each day.   In baking, for 1 cup white flour, you may use the following substitutions:   1 cup whole-wheat flour minus 2 tablespoons.   1/2 cup white flour plus 1/2 cup whole-wheat flour.

## 2013-11-06 ENCOUNTER — Inpatient Hospital Stay
Admission: RE | Admit: 2013-11-06 | Discharge: 2013-11-06 | Disposition: A | Discharge status: Home / Self Care | Payer: Medicare Other | Source: Ambulatory Visit | Attending: Neurosurgery | Admitting: Neurosurgery

## 2013-11-09 NOTE — Progress Notes (Signed)
cc'd to pcp 

## 2013-11-12 ENCOUNTER — Telehealth: Payer: Self-pay

## 2013-11-12 MED ORDER — LUBIPROSTONE 24 MCG PO CAPS: 24.0000 ug | ORAL_CAPSULE | Freq: Two times a day (BID) | ORAL | Status: DC

## 2013-11-12 NOTE — Telephone Encounter (Signed)
Did a PA for pts linzess. PA was denied because pt has not tried miralax and [place].

## 2013-11-12 NOTE — Telephone Encounter (Signed)
OK. Let's try Amitiza 82mcg BID with food. RX sent.

## 2013-11-13 NOTE — Telephone Encounter (Signed)
Pt is aware.  

## 2013-11-16 NOTE — Telephone Encounter (Signed)
Pt can not afford the Amitiza because it cost $240.00. Is there anything different she can use?

## 2013-11-17 MED ORDER — POLYETHYLENE GLYCOL 3350 17 GM/SCOOP PO POWD: ORAL | Status: DC

## 2013-11-17 NOTE — Telephone Encounter (Signed)
LMOM that new Rx had been sent to the drug store

## 2013-11-17 NOTE — Telephone Encounter (Signed)
She can try MiraLax. Prescription sent.

## 2013-11-19 NOTE — Progress Notes (Signed)
Blood drawn for labs pre- MRI Saturday. Site unremarkable, pt tolerated procedure well.

## 2013-11-21 ENCOUNTER — Ambulatory Visit
Admission: RE | Admit: 2013-11-21 | Discharge: 2013-11-21 | Disposition: A | Discharge status: Home / Self Care | Payer: Medicare Other | Source: Ambulatory Visit | Attending: Neurosurgery | Admitting: Neurosurgery

## 2013-11-21 DIAGNOSIS — M4316 Spondylolisthesis, lumbar region: Secondary | ICD-10-CM

## 2013-11-21 MED ORDER — GADOBENATE DIMEGLUMINE 529 MG/ML IV SOLN
15.0000 mL | Freq: Once | INTRAVENOUS | Status: AC | PRN
  Administered 2013-11-21: 15 mL via INTRAVENOUS

## 2013-11-30 ENCOUNTER — Telehealth: Payer: Self-pay | Admitting: Neurosurgery

## 2013-11-30 ENCOUNTER — Other Ambulatory Visit: Payer: Self-pay | Admitting: Neurosurgery

## 2013-11-30 DIAGNOSIS — M545 Low back pain, unspecified: Secondary | ICD-10-CM

## 2013-11-30 DIAGNOSIS — M5126 Other intervertebral disc displacement, lumbar region: Secondary | ICD-10-CM

## 2013-12-16 ENCOUNTER — Other Ambulatory Visit: Payer: Medicare Other

## 2013-12-23 ENCOUNTER — Encounter (HOSPITAL_COMMUNITY): Payer: Self-pay | Admitting: Pharmacy Technician

## 2013-12-23 ENCOUNTER — Ambulatory Visit (INDEPENDENT_AMBULATORY_CARE_PROVIDER_SITE_OTHER): Payer: Medicare Other | Admitting: Gastroenterology

## 2013-12-23 ENCOUNTER — Encounter: Payer: Self-pay | Admitting: Gastroenterology

## 2013-12-23 VITALS — BP 118/69 | HR 78 | Temp 98.7°F | Wt 155.2 lb

## 2013-12-23 DIAGNOSIS — K219 Gastro-esophageal reflux disease without esophagitis: Secondary | ICD-10-CM

## 2013-12-23 DIAGNOSIS — K3189 Other diseases of stomach and duodenum: Secondary | ICD-10-CM

## 2013-12-23 DIAGNOSIS — R1310 Dysphagia, unspecified: Secondary | ICD-10-CM

## 2013-12-23 DIAGNOSIS — K5900 Constipation, unspecified: Secondary | ICD-10-CM

## 2013-12-23 DIAGNOSIS — R1013 Epigastric pain: Secondary | ICD-10-CM

## 2013-12-23 NOTE — Assessment & Plan Note (Signed)
SX CONTROLLED.  CONTINUE TO MONITOR SYMPTOMS.

## 2013-12-23 NOTE — Addendum Note (Signed)
Addended by: Idamae Schuller on: 12/23/2013 03:14 PM   Modules accepted: Orders

## 2013-12-23 NOTE — Assessment & Plan Note (Signed)
SX FAIRLY WELL CONTROLLED ON OMP.  LOW FAT DIET CONTINUE OMEPRAZOLE. OPV IN 4 MOS

## 2013-12-23 NOTE — Assessment & Plan Note (Signed)
SX CONTROLLED WITH MIRALAX. INCOMPLETE EVACUATION. PT MAY HAVE IBS-C.  CONTINUE MIRALAX. HIGH FIBER DIET CONSIDER PROBIOTIC. OPV IN 4 MOS

## 2013-12-23 NOTE — Progress Notes (Signed)
Subjective:    Patient ID: Victoria Molina, female    DOB: December 11, 1945, 68 y.o.   MRN: [id number]  Lottie Mussel, MD  HPI Bowels move but doesn't feel like she gets completely empty. May have pain in ruq, middle and left upper side. BMs: 2x/day. WHEN SHE HURTS REAL BAD, SHE GETS NAUSEA.HAD WATERY STOOLS WITH PAIN IN RIGHT ABD.  HAS AWFUL TASTE IN HER MOUTH. STOOLS ARE BACK TO NORMAL. USING DICYCLOMINE 3X/WEEK. NOW HAVE A #4 BM BUT SMALL CALIBER. FEELS COLD. FEELS HEARTBURN IN MIDDLE ABD/BELOW BREASTBONE. PT DENIES FEVER, CHILLS, BRBPR,vomiting, melena, diarrhea, constipation, OR problems swallowing. HAD NAUSEA AFTER HSY. PAIN IN LUQ MOST NOTICEABLE WHEN SHE'S EXERCISING.   Past Medical History  Diagnosis Date  . HTN (hypertension)   . GERD (gastroesophageal reflux disease)   . Colon polyps 2006, 2011    HYPERPLASTIC  . H pylori ulcer 2002,2006    AF THEN ABO, bX NEG 2011/2013   Past Surgical History  Procedure Laterality Date  . Upper gastrointestinal endoscopy    . Total abdominal hysterectomy    . Back surgery    . Colonoscopy  2006, 2011    HYPERPLASTIC POLYPS   Allergies  Allergen Reactions  . Iodinated Diagnostic Agents Hives and Itching    Pt pre-meds with Benadryl 50mg  PO; tolerates ESI's.  jkl  . Sulfa Antibiotics Hives and Itching  . Vioxx [Rofecoxib] Swelling    Swelling of legs and feet    Current Outpatient Prescriptions  Medication Sig Dispense Refill  . ALPRAZolam (XANAX) 0.5 MG tablet Take 0.5 mg by mouth as needed.      Marland Kitchen AMLODIPINE BESYLATE PO Take 5 mg by mouth Daily.      Marland Kitchen dicyclomine (BENTYL) 10 MG capsule Take 10 mg by mouth 4 (four) times daily -  before meals and at bedtime.  AS NEEDED    . isosorbide dinitrate (ISORDIL) 10 MG tablet Take 10 mg by mouth 2 (two) times daily.       Marland Kitchen omeprazole (PRILOSEC) 20 MG capsule Take 20 mg by mouth daily.     . polyethylene glycol powder (GLYCOLAX/MIRALAX) powder Take 1 capful daily as needed for constipation  AS NEEDED: 3X/WEEK   .      . benzonatate (TESSALON) 100 MG capsule Take 100 mg by mouth 3 (three) times daily as needed.    .      . gabapentin (NEURONTIN) 300 MG capsule Take 300 mg by mouth daily.     .      .           Review of Systems     Objective:   Physical Exam  Vitals reviewed. Constitutional: She is oriented to person, place, and time. She appears well-nourished. No distress.  HENT:  Head: Normocephalic and atraumatic.  Mouth/Throat: Oropharynx is clear and moist. No oropharyngeal exudate.  Eyes: Pupils are equal, round, and reactive to light. No scleral icterus.  Neck: Normal range of motion. Neck supple.  Cardiovascular: Normal rate, regular rhythm and normal heart sounds.   Pulmonary/Chest: Effort normal and breath sounds normal. No respiratory distress.  Abdominal: Soft. Bowel sounds are normal. She exhibits no distension. There is tenderness. There is no rebound and no guarding.  MILD TTP IN THE EPIGASTRIUM & BUQS.  Musculoskeletal: Normal range of motion. She exhibits no edema.  Lymphadenopathy:    She has no cervical adenopathy.  Neurological: She is alert and oriented to person, place, and time.  NO  FOCAL DEFICITS   Psychiatric:  FLAT AFFECT, NL MOOD          Assessment & Plan:

## 2013-12-23 NOTE — Patient Instructions (Signed)
UPPER ENDOSCOPY THIS Friday.  CONTINUE OMEPRAZOLE.  TAKE 30 MINUTES PRIOR TO YOUR FIRST MEAL.  FOLLOW A HIGH FIBER/LOW FAT DIET. SEE INFO BELOW.  [place].   REMEMBER DICYCLOMINE MAY CAUSE CONSTIPATION.  FOLLOW UP IN 4 MOS.   Low-Fat Diet BREADS, CEREALS, PASTA, RICE, DRIED PEAS, AND BEANS These products are high in carbohydrates and most are low in fat. Therefore, they can be increased in the diet as substitutes for fatty foods. They too, however, contain calories and should not be eaten in excess. Cereals can be eaten for snacks as well as for breakfast.   FRUITS AND VEGETABLES It is good to eat fruits and vegetables. Besides being sources of fiber, both are rich in vitamins and some minerals. They help you get the daily allowances of these nutrients. Fruits and vegetables can be used for snacks and desserts.  MEATS Limit lean meat, chicken, [place], and fish to no more than 6 ounces per day. Beef, Pork, and Lamb Use lean cuts of beef, pork, and lamb. Lean cuts include:  Extra-lean ground beef.  Arm roast.  Sirloin tip.  Center-cut ham.  Round steak.  Loin chops.  Rump roast.  Tenderloin.  Trim all fat off the outside of meats before cooking. It is not necessary to severely decrease the intake of red meat, but lean choices should be made. Lean meat is rich in protein and contains a highly absorbable form of iron. Premenopausal women, in particular, should avoid reducing lean red meat because this could increase the risk for low red blood cells (iron-deficiency anemia).  Chicken and [place] These are good sources of protein. The fat of poultry can be reduced by removing the skin and underlying fat layers before cooking. Chicken and [place] can be substituted for lean red meat in the diet. Poultry should not be fried or covered with high-fat sauces. Fish and Shellfish Fish is a good source of protein. Shellfish contain cholesterol, but they usually are low in saturated fatty  acids. The preparation of fish is important. Like chicken and [place], they should not be fried or covered with high-fat sauces. EGGS Egg whites contain no fat or cholesterol. They can be eaten often. Try 1 to 2 egg whites instead of whole eggs in recipes or use egg substitutes that do not contain yolk. MILK AND DAIRY PRODUCTS Use skim or 1% milk instead of 2% or whole milk. Decrease whole milk, natural, and processed cheeses. Use nonfat or low-fat (2%) cottage cheese or low-fat cheeses made from vegetable oils. Choose nonfat or low-fat (1 to 2%) yogurt. Experiment with evaporated skim milk in recipes that call for heavy cream. Substitute low-fat yogurt or low-fat cottage cheese for sour cream in dips and salad dressings. Have at least 2 servings of low-fat dairy products, such as 2 glasses of skim (or 1%) milk each day to help get your daily calcium intake. FATS AND OILS Reduce the total intake of fats, especially saturated fat. Butterfat, lard, and beef fats are high in saturated fat and cholesterol. These should be avoided as much as possible. Vegetable fats do not contain cholesterol, but certain vegetable fats, such as coconut oil, palm oil, and palm kernel oil are very high in saturated fats. These should be limited. These fats are often used in bakery goods, processed foods, popcorn, oils, and nondairy creamers. Vegetable shortenings and some peanut butters contain hydrogenated oils, which are also saturated fats. Read the labels on these foods and check for saturated vegetable oils. Unsaturated vegetable  oils and fats do not raise blood cholesterol. However, they should be limited because they are fats and are high in calories. Total fat should still be limited to 30% of your daily caloric intake. Desirable liquid vegetable oils are corn oil, cottonseed oil, olive oil, canola oil, safflower oil, soybean oil, and sunflower oil. Peanut oil is not as good, but small amounts are acceptable. Buy a  heart-healthy tub margarine that has no partially hydrogenated oils in the ingredients. Mayonnaise and salad dressings often are made from unsaturated fats, but they should also be limited because of their high calorie and fat content. Seeds, nuts, peanut butter, olives, and avocados are high in fat, but the fat is mainly the unsaturated type. These foods should be limited mainly to avoid excess calories and fat. OTHER EATING TIPS Snacks  Most sweets should be limited as snacks. They tend to be rich in calories and fats, and their caloric content outweighs their nutritional value. Some good choices in snacks are graham crackers, melba toast, soda crackers, bagels (no egg), English muffins, fruits, and vegetables. These snacks are preferable to snack crackers, [place] fries, TORTILLA CHIPS, and POTATO chips. Popcorn should be air-popped or cooked in small amounts of liquid vegetable oil. Desserts Eat fruit, low-fat yogurt, and fruit ices instead of pastries, cake, and cookies. Sherbet, angel food cake, gelatin dessert, frozen low-fat yogurt, or other frozen products that do not contain saturated fat (pure fruit juice bars, frozen ice pops) are also acceptable.  COOKING METHODS Choose those methods that use little or no fat. They include: Poaching.  Braising.  Steaming.  Grilling.  Baking.  Stir-frying.  Broiling.  Microwaving.  Foods can be cooked in a nonstick pan without added fat, or use a nonfat cooking spray in regular cookware. Limit fried foods and avoid frying in saturated fat. Add moisture to lean meats by using water, broth, cooking wines, and other nonfat or low-fat sauces along with the cooking methods mentioned above. Soups and stews should be chilled after cooking. The fat that forms on top after a few hours in the refrigerator should be skimmed off. When preparing meals, avoid using excess salt. Salt can contribute to raising blood pressure in some people.  EATING AWAY FROM  HOME Order entres, potatoes, and vegetables without sauces or butter. When meat exceeds the size of a deck of cards (3 to 4 ounces), the rest can be taken home for another meal. Choose vegetable or fruit salads and ask for low-calorie salad dressings to be served on the side. Use dressings sparingly. Limit high-fat toppings, such as bacon, crumbled eggs, cheese, sunflower seeds, and olives. Ask for heart-healthy tub margarine instead of butter.  High-Fiber Diet A high-fiber diet changes your normal diet to include more whole grains, legumes, fruits, and vegetables. Changes in the diet involve replacing refined carbohydrates with unrefined foods. The calorie level of the diet is essentially unchanged. The Dietary Reference Intake (recommended amount) for adult males is 38 grams per day. For adult females, it is 25 grams per day. Pregnant and lactating women should consume 28 grams of fiber per day. Fiber is the intact part of a plant that is not broken down during digestion. Functional fiber is fiber that has been isolated from the plant to provide a beneficial effect in the body. PURPOSE  Increase stool bulk.   Ease and regulate bowel movements.   Lower cholesterol.  INDICATIONS THAT YOU NEED MORE FIBER  Constipation and hemorrhoids.   Uncomplicated diverticulosis (intestine condition)  and irritable bowel syndrome.   Weight management.   As a protective measure against hardening of the arteries (atherosclerosis), diabetes, and cancer.   GUIDELINES FOR INCREASING FIBER IN THE DIET  Start adding fiber to the diet slowly. A gradual increase of about 5 more grams (2 slices of whole-wheat bread, 2 servings of most fruits or vegetables, or 1 bowl of high-fiber cereal) per day is best. Too rapid an increase in fiber may result in constipation, flatulence, and bloating.   Drink enough water and fluids to keep your urine clear or pale yellow. Water, juice, or caffeine-free drinks are recommended.  Not drinking enough fluid may cause constipation.   Eat a variety of high-fiber foods rather than one type of fiber.   Try to increase your intake of fiber through using high-fiber foods rather than fiber pills or supplements that contain small amounts of fiber.   The goal is to change the types of food eaten. Do not supplement your present diet with high-fiber foods, but replace foods in your present diet.  INCLUDE A VARIETY OF FIBER SOURCES  Replace refined and processed grains with whole grains, canned fruits with fresh fruits, and incorporate other fiber sources. White rice, white breads, and most bakery goods contain little or no fiber.   Brown whole-grain rice, buckwheat oats, and many fruits and vegetables are all good sources of fiber. These include: broccoli, Brussels sprouts, cabbage, cauliflower, beets, sweet potatoes, white potatoes (skin on), carrots, tomatoes, eggplant, squash, berries, fresh fruits, and dried fruits.   Cereals appear to be the richest source of fiber. Cereal fiber is found in whole grains and bran. Bran is the fiber-rich outer coat of cereal grain, which is largely removed in refining. In whole-grain cereals, the bran remains. In breakfast cereals, the largest amount of fiber is found in those with "bran" in their names. The fiber content is sometimes indicated on the label.   You may need to include additional fruits and vegetables each day.   In baking, for 1 cup white flour, you may use the following substitutions:   1 cup whole-wheat flour minus 2 tablespoons.   1/2 cup white flour plus 1/2 cup whole-wheat flour.

## 2013-12-23 NOTE — Assessment & Plan Note (Addendum)
ASSOCIATED WITH SLIGHT WEIGHT LOSS, DIARRHEA, AND NAUSEA. USES ASA IN THE PAST. SX BETTER WITH BENTYL. PMHx: CONSTIPATION. DIFFERENTIAL DIAGNOSIS INCLUDES H PYLORI GASTRITIS, PUD, LESS LIKELY CELIAC SPRUE OR GASTRIC/SB CA.  EGD MAR 6. PHENERGAN 6.25 MG IV IN PREOP. Bx GASTRIC AND DUODENAL MUCOSA. NEEDS CT ABD/PELVIS IF NO SOURCE FOR PAIN IDENTIFIED. CONTINUE OMEPRAZOLE. OPV IN 4 MOS

## 2013-12-24 NOTE — Progress Notes (Signed)
cc'd to pcp 

## 2013-12-25 ENCOUNTER — Encounter (HOSPITAL_COMMUNITY)
Admission: RE | Disposition: A | Discharge status: Home / Self Care | Payer: Self-pay | Source: Ambulatory Visit | Attending: Gastroenterology

## 2013-12-25 ENCOUNTER — Ambulatory Visit (HOSPITAL_COMMUNITY)
Admission: RE | Admit: 2013-12-25 | Discharge: 2013-12-25 | Disposition: A | Discharge status: Home / Self Care | Payer: Medicare Other | Source: Ambulatory Visit | Attending: Gastroenterology | Admitting: Gastroenterology

## 2013-12-25 ENCOUNTER — Encounter (HOSPITAL_COMMUNITY): Payer: Self-pay | Admitting: *Deleted

## 2013-12-25 DIAGNOSIS — K2970 Gastritis, unspecified, without bleeding: Secondary | ICD-10-CM | POA: Insufficient documentation

## 2013-12-25 DIAGNOSIS — K219 Gastro-esophageal reflux disease without esophagitis: Secondary | ICD-10-CM | POA: Insufficient documentation

## 2013-12-25 DIAGNOSIS — I10 Essential (primary) hypertension: Secondary | ICD-10-CM | POA: Insufficient documentation

## 2013-12-25 DIAGNOSIS — K2990 Gastroduodenitis, unspecified, without bleeding: Secondary | ICD-10-CM

## 2013-12-25 DIAGNOSIS — Z79899 Other long term (current) drug therapy: Secondary | ICD-10-CM | POA: Insufficient documentation

## 2013-12-25 DIAGNOSIS — K3189 Other diseases of stomach and duodenum: Secondary | ICD-10-CM | POA: Insufficient documentation

## 2013-12-25 DIAGNOSIS — D131 Benign neoplasm of stomach: Secondary | ICD-10-CM | POA: Insufficient documentation

## 2013-12-25 DIAGNOSIS — R1013 Epigastric pain: Secondary | ICD-10-CM

## 2013-12-25 DIAGNOSIS — K449 Diaphragmatic hernia without obstruction or gangrene: Secondary | ICD-10-CM | POA: Insufficient documentation

## 2013-12-25 HISTORY — PX: ESOPHAGOGASTRODUODENOSCOPY: SHX5428

## 2013-12-25 SURGERY — EGD (ESOPHAGOGASTRODUODENOSCOPY)
Anesthesia: Moderate Sedation

## 2013-12-25 MED ORDER — SODIUM CHLORIDE 0.9 % IV SOLN
INTRAVENOUS | Status: DC
  Administered 2013-12-25: 1000 mL via INTRAVENOUS

## 2013-12-25 MED ORDER — PROMETHAZINE HCL 25 MG/ML IJ SOLN
6.2500 mg | Freq: Once | INTRAMUSCULAR | Status: AC
  Administered 2013-12-25: 6.25 mg via INTRAVENOUS
  Filled 2013-12-25: qty 1

## 2013-12-25 MED ORDER — MEPERIDINE HCL 100 MG/ML IJ SOLN
INTRAMUSCULAR | Status: AC
  Filled 2013-12-25: qty 2

## 2013-12-25 MED ORDER — MIDAZOLAM HCL 5 MG/5ML IJ SOLN
INTRAMUSCULAR | Status: AC
  Filled 2013-12-25: qty 10

## 2013-12-25 MED ORDER — LIDOCAINE VISCOUS 2 % MT SOLN
OROMUCOSAL | Status: AC
  Filled 2013-12-25: qty 15

## 2013-12-25 MED ORDER — STERILE WATER FOR IRRIGATION IR SOLN
Status: DC | PRN
  Administered 2013-12-25: 14:00:00

## 2013-12-25 MED ORDER — MIDAZOLAM HCL 5 MG/5ML IJ SOLN
INTRAMUSCULAR | Status: DC | PRN
  Administered 2013-12-25 (×3): 1 mg via INTRAVENOUS

## 2013-12-25 MED ORDER — MEPERIDINE HCL 100 MG/ML IJ SOLN
INTRAMUSCULAR | Status: DC | PRN
  Administered 2013-12-25 (×2): 25 mg via INTRAVENOUS

## 2013-12-25 MED ORDER — SODIUM CHLORIDE 0.9 % IJ SOLN
INTRAMUSCULAR | Status: AC
  Filled 2013-12-25: qty 10

## 2013-12-25 MED ORDER — LIDOCAINE VISCOUS 2 % MT SOLN
OROMUCOSAL | Status: DC | PRN
  Administered 2013-12-25: 1 via OROMUCOSAL

## 2013-12-25 NOTE — H&P (View-Only) (Signed)
 Subjective:    Patient ID: Victoria Molina, female    DOB: 08/04/1946, 68 y.o.   MRN: [id number]  WALKER, JOHN B, MD  HPI Bowels move but doesn't feel like she gets completely empty. May have pain in ruq, middle and left upper side. BMs: 2x/day. WHEN SHE HURTS REAL BAD, SHE GETS NAUSEA.HAD WATERY STOOLS WITH PAIN IN RIGHT ABD.  HAS AWFUL TASTE IN HER MOUTH. STOOLS ARE BACK TO NORMAL. USING DICYCLOMINE 3X/WEEK. NOW HAVE A #4 BM BUT SMALL CALIBER. FEELS COLD. FEELS HEARTBURN IN MIDDLE ABD/BELOW BREASTBONE. PT DENIES FEVER, CHILLS, BRBPR,vomiting, melena, diarrhea, constipation, OR problems swallowing. HAD NAUSEA AFTER HSY. PAIN IN LUQ MOST NOTICEABLE WHEN SHE'S EXERCISING.   Past Medical History  Diagnosis Date  . HTN (hypertension)   . GERD (gastroesophageal reflux disease)   . Colon polyps 2006, 2011    HYPERPLASTIC  . H pylori ulcer 2002,2006    AF THEN ABO, bX NEG 2011/2013   Past Surgical History  Procedure Laterality Date  . Upper gastrointestinal endoscopy    . Total abdominal hysterectomy    . Back surgery    . Colonoscopy  2006, 2011    HYPERPLASTIC POLYPS   Allergies  Allergen Reactions  . Iodinated Diagnostic Agents Hives and Itching    Pt pre-meds with Benadryl 50mg PO; tolerates ESI's.  jkl  . Sulfa Antibiotics Hives and Itching  . Vioxx [Rofecoxib] Swelling    Swelling of legs and feet    Current Outpatient Prescriptions  Medication Sig Dispense Refill  . ALPRAZolam (XANAX) 0.5 MG tablet Take 0.5 mg by mouth as needed.      . AMLODIPINE BESYLATE PO Take 5 mg by mouth Daily.      . dicyclomine (BENTYL) 10 MG capsule Take 10 mg by mouth 4 (four) times daily -  before meals and at bedtime.  AS NEEDED    . isosorbide dinitrate (ISORDIL) 10 MG tablet Take 10 mg by mouth 2 (two) times daily.       . omeprazole (PRILOSEC) 20 MG capsule Take 20 mg by mouth daily.     . polyethylene glycol powder (GLYCOLAX/MIRALAX) powder Take 1 capful daily as needed for constipation  AS NEEDED: 3X/WEEK   .      . benzonatate (TESSALON) 100 MG capsule Take 100 mg by mouth 3 (three) times daily as needed.    .      . gabapentin (NEURONTIN) 300 MG capsule Take 300 mg by mouth daily.     .      .           Review of Systems     Objective:   Physical Exam  Vitals reviewed. Constitutional: She is oriented to person, place, and time. She appears well-nourished. No distress.  HENT:  Head: Normocephalic and atraumatic.  Mouth/Throat: Oropharynx is clear and moist. No oropharyngeal exudate.  Eyes: Pupils are equal, round, and reactive to light. No scleral icterus.  Neck: Normal range of motion. Neck supple.  Cardiovascular: Normal rate, regular rhythm and normal heart sounds.   Pulmonary/Chest: Effort normal and breath sounds normal. No respiratory distress.  Abdominal: Soft. Bowel sounds are normal. She exhibits no distension. There is tenderness. There is no rebound and no guarding.  MILD TTP IN THE EPIGASTRIUM & BUQS.  Musculoskeletal: Normal range of motion. She exhibits no edema.  Lymphadenopathy:    She has no cervical adenopathy.  Neurological: She is alert and oriented to person, place, and time.  NO   FOCAL DEFICITS   Psychiatric:  FLAT AFFECT, NL MOOD          Assessment & Plan:

## 2013-12-25 NOTE — Discharge Instructions (Signed)
You have gastritis. I biopsied your stomach.  TAKE OMEPRAZOLE 30 MINUTES PRIOR TO YOUR FIRST MEAL DAILY FOREVER. AVOID TRIGGERS FOR GASTRITIS. SEE INFO BELOW. FOLLOW A LOW FAT DIET. SEE INFO BELOW. FOLLOW UP IN JUL 2015.  UPPER ENDOSCOPY AFTER CARE Read the instructions outlined below and refer to this sheet in the next week. These discharge instructions provide you with general information on caring for yourself after you leave the hospital. While your treatment has been planned according to the most current medical practices available, unavoidable complications occasionally occur. If you have any problems or questions after discharge, call DR. Seara Hinesley, [phone].  ACTIVITY  You may resume your regular activity, but move at a slower pace for the next 24 hours.   Take frequent rest periods for the next 24 hours.   Walking will help get rid of the air and reduce the bloated feeling in your belly (abdomen).   No driving for 24 hours (because of the medicine (anesthesia) used during the test).   You may shower.   Do not sign any important legal documents or operate any machinery for 24 hours (because of the anesthesia used during the test).    NUTRITION  Drink plenty of fluids.   You may resume your normal diet as instructed by your doctor.   Begin with a light meal and progress to your normal diet. Heavy or fried foods are harder to digest and may make you feel sick to your stomach (nauseated).   Avoid alcoholic beverages for 24 hours or as instructed.    MEDICATIONS  You may resume your normal medications.   WHAT YOU CAN EXPECT TODAY  Some feelings of bloating in the abdomen.   Passage of more gas than usual.    IF YOU HAD A BIOPSY TAKEN DURING THE UPPER ENDOSCOPY:  Eat a soft diet IF YOU HAVE NAUSEA, BLOATING, ABDOMINAL PAIN, OR VOMITING.    FINDING OUT THE RESULTS OF YOUR TEST Not all test results are available during your visit. DR. Oneida Alar WILL CALL YOU  WITHIN 7 DAYS OF YOUR PROCEDUE WITH YOUR RESULTS. Do not assume everything is normal if you have not heard from DR. Shana Younge IN ONE WEEK, CALL HER OFFICE AT [phone].  SEEK IMMEDIATE MEDICAL ATTENTION AND CALL THE OFFICE: [phone] IF:  You have more than a spotting of blood in your stool.   Your belly is swollen (abdominal distention).   You are nauseated or vomiting.   You have a temperature over 101F.   You have abdominal pain or discomfort that is severe or gets worse throughout the day.   Gastritis  Gastritis is an inflammation (the body's way of reacting to injury and/or infection) of the stomach. It is often caused by viral or bacterial (germ) infections. It can also be caused BY ASPIRIN, BC/GOODY POWDER'S, (IBUPROFEN) MOTRIN, OR ALEVE (NAPROXEN), chemicals (including alcohol), SPICY FOODS, and medications. This illness may be associated with generalized malaise (feeling tired, not well), UPPER ABDOMINAL STOMACH cramps, and fever. One common bacterial cause of gastritis is an organism known as H. Pylori. This can be treated with antibiotics.    Low-Fat Diet  BREADS, CEREALS, PASTA, RICE, DRIED PEAS, AND BEANS These products are high in carbohydrates and most are low in fat. Therefore, they can be increased in the diet as substitutes for fatty foods. They too, however, contain calories and should not be eaten in excess. Cereals can be eaten for snacks as well as for breakfast.  Include foods that contain  fiber (fruits, vegetables, whole grains, and legumes). Research shows that fiber may lower blood cholesterol levels, especially the water-soluble fiber found in fruits, vegetables, oat products, and legumes.  FRUITS AND VEGETABLES It is good to eat fruits and vegetables. Besides being sources of fiber, both are rich in vitamins and some minerals. They help you get the daily allowances of these nutrients. Fruits and vegetables can be used for snacks and desserts.  MEATS Limit  lean meat, chicken, [place], and fish to no more than 6 ounces per day.  Beef, Pork, and Lamb Use lean cuts of beef, pork, and lamb. Lean cuts include:  Extra-lean ground beef.  Arm roast.  Sirloin tip.  Center-cut ham.  Round steak.  Loin chops.  Rump roast.  Tenderloin.  Trim all fat off the outside of meats before cooking. It is not necessary to severely decrease the intake of red meat, but lean choices should be made. Lean meat is rich in protein and contains a highly absorbable form of iron. Premenopausal women, in particular, should avoid reducing lean red meat because this could increase the risk for low red blood cells (iron-deficiency anemia).  Chicken and [place] These are good sources of protein. The fat of poultry can be reduced by removing the skin and underlying fat layers before cooking. Chicken and [place] can be substituted for lean red meat in the diet. Poultry should not be fried or covered with high-fat sauces.  Fish and Shellfish Fish is a good source of protein. Shellfish contain cholesterol, but they usually are low in saturated fatty acids. The preparation of fish is important. Like chicken and [place], they should not be fried or covered with high-fat sauces.  EGGS Egg whites contain no fat or cholesterol. They can be eaten often. Try 1 to 2 egg whites instead of whole eggs in recipes or use egg substitutes that do not contain yolk.  MILK AND DAIRY PRODUCTS Use skim or 1% milk instead of 2% or whole milk. Decrease whole milk, natural, and processed cheeses. Use nonfat or low-fat (2%) cottage cheese or low-fat cheeses made from vegetable oils. Choose nonfat or low-fat (1 to 2%) yogurt. Experiment with evaporated skim milk in recipes that call for heavy cream. Substitute low-fat yogurt or low-fat cottage cheese for sour cream in dips and salad dressings. Have at least 2 servings of low-fat dairy products, such as 2 glasses of skim (or 1%) milk each day to help get your  daily calcium intake.  FATS AND OILS Reduce the total intake of fats, especially saturated fat. Butterfat, lard, and beef fats are high in saturated fat and cholesterol. These should be avoided as much as possible. Vegetable fats do not contain cholesterol, but certain vegetable fats, such as coconut oil, palm oil, and palm kernel oil are very high in saturated fats. These should be limited. These fats are often used in bakery goods, processed foods, popcorn, oils, and nondairy creamers. Vegetable shortenings and some peanut butters contain hydrogenated oils, which are also saturated fats. Read the labels on these foods and check for saturated vegetable oils.  Unsaturated vegetable oils and fats do not raise blood cholesterol. However, they should be limited because they are fats and are high in calories. Total fat should still be limited to 30% of your daily caloric intake. Desirable liquid vegetable oils are corn oil, cottonseed oil, olive oil, canola oil, safflower oil, soybean oil, and sunflower oil. Peanut oil is not as good, but small amounts are acceptable. Buy a  heart-healthy tub margarine that has no partially hydrogenated oils in the ingredients. Mayonnaise and salad dressings often are made from unsaturated fats, but they should also be limited because of their high calorie and fat content. Seeds, nuts, peanut butter, olives, and avocados are high in fat, but the fat is mainly the unsaturated type. These foods should be limited mainly to avoid excess calories and fat.  OTHER EATING TIPS Snacks  Most sweets should be limited as snacks. They tend to be rich in calories and fats, and their caloric content outweighs their nutritional value. Some good choices in snacks are graham crackers, melba toast, soda crackers, bagels (no egg), English muffins, fruits, and vegetables. These snacks are preferable to snack crackers, [place] fries, and chips. Popcorn should be air-popped or cooked in small amounts  of liquid vegetable oil.  Desserts Eat fruit, low-fat yogurt, and fruit ices instead of pastries, cake, and cookies. Sherbet, angel food cake, gelatin dessert, frozen low-fat yogurt, or other frozen products that do not contain saturated fat (pure fruit juice bars, frozen ice pops) are also acceptable.   COOKING METHODS Choose those methods that use little or no fat. They include: Poaching.  Braising.  Steaming.  Grilling.  Baking.  Stir-frying.  Broiling.  Microwaving.  Foods can be cooked in a nonstick pan without added fat, or use a nonfat cooking spray in regular cookware. Limit fried foods and avoid frying in saturated fat. Add moisture to lean meats by using water, broth, cooking wines, and other nonfat or low-fat sauces along with the cooking methods mentioned above. Soups and stews should be chilled after cooking. The fat that forms on top after a few hours in the refrigerator should be skimmed off. When preparing meals, avoid using excess salt. Salt can contribute to raising blood pressure in some people.  EATING AWAY FROM HOME Order entres, potatoes, and vegetables without sauces or butter. When meat exceeds the size of a deck of cards (3 to 4 ounces), the rest can be taken home for another meal. Choose vegetable or fruit salads and ask for low-calorie salad dressings to be served on the side. Use dressings sparingly. Limit high-fat toppings, such as bacon, crumbled eggs, cheese, sunflower seeds, and olives. Ask for heart-healthy tub margarine instead of butter.

## 2013-12-25 NOTE — Interval H&P Note (Signed)
History and Physical Interval Note:  12/25/2013 12:55 PM  Victoria Molina  has presented today for surgery, with the diagnosis of DYSPEPSIA  The various methods of treatment have been discussed with the patient and family. After consideration of risks, benefits and other options for treatment, the patient has consented to  Procedure(s) with comments: ESOPHAGOGASTRODUODENOSCOPY (EGD) (N/A) - 1:45PM as a surgical intervention .  The patient's history has been reviewed, patient examined, no change in status, stable for surgery.  I have reviewed the patient's chart and labs.  Questions were answered to the patient's satisfaction.     [employer]

## 2013-12-25 NOTE — Op Note (Signed)
[place], [id number]   ENDOSCOPY PROCEDURE REPORT  PATIENT: Victoria Molina, Victoria Molina  MR#: [id number] BIRTHDATE: 06/09/1946 , 68  yrs. old GENDER: Female  ENDOSCOPIST: Barney Drain, MD REFERRED AL:PFXT Sarina Ser, M.D.  PROCEDURE DATE: 12/25/2013 PROCEDURE:   EGD w/ biopsy  INDICATIONS:Dyspepsia. MEDICATIONS: Demerol 50 mg IV and Versed 3 mg IV TOPICAL ANESTHETIC:   Viscous Xylocaine  DESCRIPTION OF PROCEDURE:     Physical exam was performed.  Informed consent was obtained from the patient after explaining the benefits, risks, and alternatives to the procedure.  The patient was connected to the monitor and placed in the left lateral position.  Continuous oxygen was provided by nasal cannula and IV medicine administered through an indwelling cannula.  After administration of sedation, the patients esophagus was intubated and the EG-[id number] ([id number])  endoscope was advanced under direct visualization to the second portion of the duodenum.  The scope was removed slowly by carefully examining the color, texture, anatomy, and integrity of the mucosa on the way out.  The patient was recovered in endoscopy and discharged home in satisfactory condition.   ESOPHAGUS: The mucosa of the esophagus appeared normal.   A small hiatal hernia was noted.   STOMACH: Mild non-erosive gastritis (inflammation) was found in the gastric antrum.  Multiple biopsies were performed using cold forceps.   DUODENUM: The duodenal mucosa showed no abnormalities in the bulb and second portion of the duodenum.  Cold forcep biopsies were taken in the second portion.  COMPLICATIONS:   None  ENDOSCOPIC IMPRESSION: 1.   NORMAL ESOPHAGUS 2.   Small hiatal hernia 3.   MILD gastritis  RECOMMENDATIONS: TAKE OMEPRAZOLE 30 MINUTES PRIOR TO YOUR FIRST MEAL DAILY FOREVER. AVOID TRIGGERS FOR GASTRITIS. Biopsy will be back in 7 days. FOLLOW A LOW FAT DIET. FOLLOW UP IN JUL  2015.   REPEAT EXAM:   _______________________________ Lorrin MaisBarney Drain, MD 12/25/2013 3:45 PM

## 2013-12-28 ENCOUNTER — Encounter (HOSPITAL_COMMUNITY): Payer: Self-pay | Admitting: Gastroenterology

## 2013-12-28 NOTE — Progress Notes (Signed)
Reminder in epic °

## 2013-12-30 ENCOUNTER — Telehealth: Payer: Self-pay | Admitting: Gastroenterology

## 2013-12-30 NOTE — Telephone Encounter (Signed)
Please call pt. HER stomach Bx shows mild gastritis.  HER SMALL BOWEL BIOPSIES ARE NORMAL.  TAKE OMEPRAZOLE 30 MINUTES PRIOR TO YOUR FIRST MEAL DAILY FOREVER. AVOID TRIGGERS FOR GASTRITIS.  FOLLOW A LOW FAT DIET.  CALL IN one month IF SYMPTOMS ARE NOT IMPROVED.   FOLLOW UP IN JUL 2015.

## 2013-12-31 NOTE — Telephone Encounter (Signed)
Pt is aware of results.  Victoria Molina  Can you NIC for July

## 2013-12-31 NOTE — Telephone Encounter (Signed)
Reminder in EPIC 

## 2013-12-31 NOTE — Telephone Encounter (Signed)
Tried to call with no answer  

## 2014-01-04 NOTE — Telephone Encounter (Signed)
Reminder in epic °

## 2014-02-02 ENCOUNTER — Telehealth: Payer: Self-pay | Admitting: *Deleted

## 2014-02-02 NOTE — Telephone Encounter (Signed)
I called pt and she said she has not taken the Miralax in 8 days. She got too loose. She is still having regular BM's. Although they are a little soft and smaller. She has had some rectal itching and and thinks she has some hemorrhoids. She has not had any rectal bleeding.   I scheduled her an OV with Laban Emperor, NP on 03/02/2014 at 1:30 pm.

## 2014-02-02 NOTE — Telephone Encounter (Signed)
Noted  

## 2014-02-02 NOTE — Telephone Encounter (Signed)
Pt called saying that Dr. Oneida Alar put her on merilax at first the medicine was doing good, now she feels like she has to go to the bathroom all the time, pt said she is burning and itching a little bit, pt thinks she may have hemorrhoids. Pt does not want to take the merilax anymore if she needs to keep taking it she will but she has not took any in 7 or 8 days. Please advise [phone]

## 2014-02-24 ENCOUNTER — Ambulatory Visit
Admission: RE | Admit: 2014-02-24 | Discharge: 2014-02-24 | Disposition: A | Discharge status: Home / Self Care | Payer: Medicare Other | Source: Ambulatory Visit | Attending: Neurosurgery | Admitting: Neurosurgery

## 2014-02-24 DIAGNOSIS — M5126 Other intervertebral disc displacement, lumbar region: Secondary | ICD-10-CM

## 2014-02-24 DIAGNOSIS — M545 Low back pain, unspecified: Secondary | ICD-10-CM

## 2014-02-24 MED ORDER — METHYLPREDNISOLONE ACETATE 40 MG/ML INJ SUSP (RADIOLOG
120.0000 mg | Freq: Once | INTRAMUSCULAR | Status: AC
  Administered 2014-02-24: 120 mg via EPIDURAL

## 2014-02-24 MED ORDER — IOHEXOL 180 MG/ML  SOLN
1.0000 mL | Freq: Once | INTRAMUSCULAR | Status: AC | PRN
  Administered 2014-02-24: 1 mL via EPIDURAL

## 2014-02-24 NOTE — Discharge Instructions (Signed)

## 2014-03-02 ENCOUNTER — Encounter: Payer: Self-pay | Admitting: Gastroenterology

## 2014-03-02 ENCOUNTER — Ambulatory Visit (INDEPENDENT_AMBULATORY_CARE_PROVIDER_SITE_OTHER): Payer: Medicare Other | Admitting: Gastroenterology

## 2014-03-02 VITALS — BP 126/76 | HR 77 | Temp 97.3°F | Ht 65.0 in | Wt 153.0 lb

## 2014-03-02 DIAGNOSIS — R1011 Right upper quadrant pain: Secondary | ICD-10-CM

## 2014-03-02 DIAGNOSIS — K5900 Constipation, unspecified: Secondary | ICD-10-CM

## 2014-03-02 NOTE — Progress Notes (Signed)
Referring Provider: [name], MD Primary Care Physician:  Lottie Mussel, MD Primary GI: Dr. Oneida Alar   Chief Complaint  Patient presents with  . Hemorrhoids    HPI:   Victoria Molina presents today in follow-up from EGD performed secondary to dyspepsia, revealing gastritis and normal small bowel biopsies. Notes bitter taste in mouth, intermittent for "a long time". Notes RUQ pain, intermittent, sometimes associated with eating. Some N/V if pain occurs in the morning. On a diet now, trying to lose some weight. Last US abdomen about 3 years ago. Also sounds as if she had a HIDA scan. States bitter taste occurs with RUQ pain.   Takes Miralax but doesn't feel like it empties completely. Takes as needed. Was given Linzess 145 mcg in Jan 2015 but never started. No rectal bleeding. States her stool is "light". Denies clay-colored stools. States it is "real light". Difficult to explain. Last labs in April 2015 with PCP per patient.   Past Medical History  Diagnosis Date  . HTN (hypertension)   . GERD (gastroesophageal reflux disease)   . Colon polyps 2006, 2011    HYPERPLASTIC  . H pylori ulcer 2002,2006    AF THEN ABO, bX NEG 2011/2013    Past Surgical History  Procedure Laterality Date  . Upper gastrointestinal endoscopy    . Total abdominal hysterectomy    . Back surgery    . Colonoscopy  2006, 2011    HYPERPLASTIC POLYPS  . Appendectomy    . Esophagogastroduodenoscopy N/A 12/25/2013    Dr. Maryclare Bean ESOPHAGUS/Small hiatal hernia/MILD gastritis, normal small bowel biopsies    Current Outpatient Prescriptions  Medication Sig Dispense Refill  . amLODipine (NORVASC) 5 MG tablet Take 5 mg by mouth daily.      Marland Kitchen CINNAMON PO Take 2,000 mg by mouth daily.      . clorazepate (TRANXENE) 7.5 MG tablet Take 7.5 mg by mouth 2 (two) times daily as needed for anxiety.      . dicyclomine (BENTYL) 10 MG capsule Take 10 mg by mouth 2 (two) times daily.       Marland Kitchen etodolac (LODINE)  500 MG tablet Take 500 mg by mouth 2 (two) times daily.      . isosorbide dinitrate (ISORDIL) 10 MG tablet Take 10 mg by mouth 2 (two) times daily.       . Multiple Vitamin (MULTIVITAMIN WITH MINERALS) TABS tablet Take 1 tablet by mouth daily.      . Omega-3 Fatty Acids (FISH OIL) 1000 MG CAPS Take 1 capsule by mouth daily.      Marland Kitchen omeprazole (PRILOSEC) 20 MG capsule Take 20 mg by mouth daily.       . polyethylene glycol (MIRALAX / GLYCOLAX) packet Take 17 g by mouth daily as needed.      . Probiotic Product (PROBIOTIC DAILY PO) Take 1 capsule by mouth daily.       No current facility-administered medications for this visit.    Allergies as of 03/02/2014 - Review Complete 03/02/2014  Allergen Reaction Noted  . Iodinated diagnostic agents Hives and Itching 12/27/2010  . Sulfa antibiotics Hives and Itching 12/27/2010  . Vioxx [rofecoxib] Swelling 05/08/2012    Family History  Problem Relation Age of Onset  . Colon cancer Neg Hx   . Colon polyps Neg Hx     History   Social History  . Marital Status: Single    Spouse Name: N/A    Number of Children: N/A  .  Years of Education: N/A   Occupational History  .  [employer]    RETIRED   Social History Main Topics  . Smoking status: Former Smoker    Types: Cigarettes    Quit date: 05/09/1987  . Smokeless tobacco: None     Comment: Quit x 25 years ago  . Alcohol Use: No  . Drug Use: No  . Sexual Activity: None   Other Topics Concern  . None   Social History Narrative  . None    Review of Systems: As mentioned in HPI.   Physical Exam: BP 126/76  Pulse 77  Temp(Src) 97.3 F (36.3 C) (Oral)  Ht 5\' 5"  (1.651 m)  Wt 153 lb (69.4 kg)  BMI 25.46 kg/m2 General:   Alert and oriented. No distress noted. Pleasant and cooperative.  Head:  Normocephalic and atraumatic. Eyes:  Conjuctiva clear without scleral icterus. Abdomen:  +BS, soft, mild TTP epigastric and RUQ and non-distended. No rebound or guarding. No HSM or masses  noted. Msk:  Symmetrical without gross deformities. Normal posture. Extremities:  Without edema. Neurologic:  Alert and  Oriented x4;  grossly normal neurologically. Skin:  Intact without significant lesions or rashes. Psych:  Alert and cooperative. Normal mood and affect.

## 2014-03-02 NOTE — Patient Instructions (Signed)
Start taking Linzess 1 capsule each morning, 30 minutes before breakfast. If this works well for you, please call so we may send further prescriptions.   Stop Miralax while taking Linzess.   I have scheduled you for an ultrasound of your belly with a possible HIDA scan if needed.

## 2014-03-03 NOTE — Assessment & Plan Note (Signed)
Start Linzess 145 mcg daily; patient has prescription already from a visit recently.

## 2014-03-03 NOTE — Assessment & Plan Note (Signed)
68 year old female with intermittent RUQ pain, associated N/V, with gallbladder remaining in situ. Interesting report of bitter taste associated with pain. EGD on file with gastritis and normal small bowel biopsies. Will proceed with [place] +/-HIDA scan in near future. Consider CT if negative US/HIDA to assess for occult malignancy; question bitter test secondary to GERD but unable to exclude occult malignancy.

## 2014-03-04 NOTE — Progress Notes (Signed)
Received outside labs dated February 08, 2014 . Hgb normal at 14.5. No leukocytosis. TSH elevated at 6.459. No LFTs available.  Please have patient complete LFTs and lipase.

## 2014-03-05 ENCOUNTER — Ambulatory Visit (HOSPITAL_COMMUNITY)
Admission: RE | Admit: 2014-03-05 | Discharge: 2014-03-05 | Disposition: A | Discharge status: Home / Self Care | Payer: Medicare Other | Source: Ambulatory Visit | Attending: Gastroenterology | Admitting: Gastroenterology

## 2014-03-05 ENCOUNTER — Other Ambulatory Visit: Payer: Self-pay | Admitting: Gastroenterology

## 2014-03-05 DIAGNOSIS — R1011 Right upper quadrant pain: Secondary | ICD-10-CM

## 2014-03-08 NOTE — Progress Notes (Signed)
cc'd to pcp 

## 2014-03-09 ENCOUNTER — Encounter (HOSPITAL_COMMUNITY): Payer: Self-pay

## 2014-03-09 ENCOUNTER — Encounter (HOSPITAL_COMMUNITY)
Admission: RE | Admit: 2014-03-09 | Discharge: 2014-03-09 | Disposition: A | Discharge status: Home / Self Care | Payer: Medicare Other | Source: Ambulatory Visit | Attending: Gastroenterology | Admitting: Gastroenterology

## 2014-03-09 DIAGNOSIS — R1011 Right upper quadrant pain: Secondary | ICD-10-CM

## 2014-03-09 DIAGNOSIS — R109 Unspecified abdominal pain: Secondary | ICD-10-CM | POA: Insufficient documentation

## 2014-03-09 MED ORDER — SINCALIDE 5 MCG IJ SOLR
INTRAMUSCULAR | Status: AC
  Administered 2014-03-09: 1.41 ug
  Filled 2014-03-09: qty 5

## 2014-03-09 MED ORDER — TECHNETIUM TC 99M MEBROFENIN IV KIT
5.0000 | PACK | Freq: Once | INTRAVENOUS | Status: AC | PRN
  Administered 2014-03-09: 5 via INTRAVENOUS

## 2014-03-09 MED ORDER — STERILE WATER FOR INJECTION IJ SOLN
INTRAMUSCULAR | Status: AC
  Administered 2014-03-09: 5 mL
  Filled 2014-03-09: qty 10

## 2014-03-11 ENCOUNTER — Other Ambulatory Visit: Payer: Self-pay

## 2014-03-11 DIAGNOSIS — R1011 Right upper quadrant pain: Secondary | ICD-10-CM

## 2014-03-11 NOTE — Progress Notes (Signed)
Quick Note:  Tried to call pt. Many rings and no answer. Lab order has been faxed to [facility]. ______

## 2014-03-11 NOTE — Progress Notes (Signed)
Quick Note:  Normal [place].  HIDA with normal EF at 91.4% BUT she did have pain with CCK administration. We need LFTs.  Refer to general surgery for consideration of elective cholecystectomy How is patient doing? ______

## 2014-03-16 ENCOUNTER — Other Ambulatory Visit: Payer: Self-pay | Admitting: Gastroenterology

## 2014-03-16 DIAGNOSIS — R1011 Right upper quadrant pain: Secondary | ICD-10-CM

## 2014-03-16 NOTE — Progress Notes (Signed)
Quick Note:  Pt is aware that the appt does not have to be urgent. She went to do her lab today. She said to please call her on her cell phone. Do not call on the home phone. ______

## 2014-03-16 NOTE — Progress Notes (Signed)
Quick Note:  It is an elective consultation. If she is not having abdominal pain, it is not urgent to go. She could follow a low fat diet. I would like to get LFTs though. Thanks! ______

## 2014-03-16 NOTE — Progress Notes (Signed)
Quick Note:  Called and informed pt. She will go get the labs done. She said she is feeling fine now, but is willing to have a consult with the surgeon if Vicente Males wants to proceed. ______

## 2014-03-17 LAB — HEPATIC FUNCTION PANEL
ALT: 18 U/L (ref 0–35)
AST: 22 U/L (ref 0–37)
Albumin: 4.3 g/dL (ref 3.5–5.2)
Alkaline Phosphatase: 97 U/L (ref 39–117)
Bilirubin, Direct: 0.2 mg/dL (ref 0.0–0.3)
Indirect Bilirubin: 0.5 mg/dL (ref 0.2–1.2)
Total Bilirubin: 0.7 mg/dL (ref 0.2–1.2)
Total Protein: 6.7 g/dL (ref 6.0–8.3)

## 2014-03-17 LAB — CBC
HCT: 42 %
HGB: 14.5 g/dL
TSH: 6.46 u[IU]/mL — AB (ref 0.41–5.90)

## 2014-03-17 LAB — LIPASE: Lipase: <10 U/L (ref 0–75)

## 2014-03-17 NOTE — Progress Notes (Signed)
Lipase was not with original order but I called [facility] and spoke to [place] in customer service and it has been added.

## 2014-03-22 ENCOUNTER — Other Ambulatory Visit: Payer: Self-pay | Admitting: Neurosurgery

## 2014-03-22 DIAGNOSIS — M4317 Spondylolisthesis, lumbosacral region: Secondary | ICD-10-CM

## 2014-03-24 ENCOUNTER — Other Ambulatory Visit: Payer: Medicare Other

## 2014-03-24 NOTE — Progress Notes (Signed)
Quick Note:  LFTs and lipase normal. ______

## 2014-03-25 ENCOUNTER — Ambulatory Visit
Admission: RE | Admit: 2014-03-25 | Discharge: 2014-03-25 | Disposition: A | Discharge status: Home / Self Care | Payer: Medicare Other | Source: Ambulatory Visit | Attending: Neurosurgery | Admitting: Neurosurgery

## 2014-03-25 VITALS — BP 135/70 | HR 67

## 2014-03-25 DIAGNOSIS — M4317 Spondylolisthesis, lumbosacral region: Secondary | ICD-10-CM

## 2014-03-25 MED ORDER — METHYLPREDNISOLONE ACETATE 40 MG/ML INJ SUSP (RADIOLOG
120.0000 mg | Freq: Once | INTRAMUSCULAR | Status: AC
  Administered 2014-03-25: 120 mg via EPIDURAL

## 2014-03-25 MED ORDER — IOHEXOL 180 MG/ML  SOLN
1.0000 mL | Freq: Once | INTRAMUSCULAR | Status: AC | PRN
  Administered 2014-03-25: 1 mL via EPIDURAL

## 2014-03-25 NOTE — Discharge Instructions (Signed)

## 2014-03-26 NOTE — Progress Notes (Signed)
Quick Note:  Pt is aware of results. ______ 

## 2014-04-05 ENCOUNTER — Emergency Department: Payer: Self-pay | Admitting: Emergency Medicine

## 2014-04-09 ENCOUNTER — Other Ambulatory Visit: Payer: Self-pay | Admitting: Neurosurgery

## 2014-04-09 DIAGNOSIS — M4310 Spondylolisthesis, site unspecified: Secondary | ICD-10-CM

## 2014-04-12 ENCOUNTER — Other Ambulatory Visit: Payer: Medicare Other

## 2014-04-12 ENCOUNTER — Ambulatory Visit
Admission: RE | Admit: 2014-04-12 | Discharge: 2014-04-12 | Disposition: A | Discharge status: Home / Self Care | Payer: Medicare Other | Source: Ambulatory Visit | Attending: Neurosurgery | Admitting: Neurosurgery

## 2014-04-12 DIAGNOSIS — M4310 Spondylolisthesis, site unspecified: Secondary | ICD-10-CM

## 2014-04-12 MED ORDER — GADOBENATE DIMEGLUMINE 529 MG/ML IV SOLN
13.0000 mL | Freq: Once | INTRAVENOUS | Status: AC | PRN
  Administered 2014-04-12: 13 mL via INTRAVENOUS

## 2014-04-20 ENCOUNTER — Other Ambulatory Visit: Payer: Self-pay | Admitting: Neurosurgery

## 2014-04-20 DIAGNOSIS — M4316 Spondylolisthesis, lumbar region: Secondary | ICD-10-CM

## 2014-04-27 ENCOUNTER — Other Ambulatory Visit: Payer: Self-pay | Admitting: Neurosurgery

## 2014-04-28 ENCOUNTER — Encounter: Payer: Self-pay | Admitting: Gastroenterology

## 2014-04-29 ENCOUNTER — Ambulatory Visit
Admission: RE | Admit: 2014-04-29 | Discharge: 2014-04-29 | Disposition: A | Discharge status: Home / Self Care | Payer: Medicare Other | Source: Ambulatory Visit | Attending: Neurosurgery | Admitting: Neurosurgery

## 2014-04-29 DIAGNOSIS — M4316 Spondylolisthesis, lumbar region: Secondary | ICD-10-CM

## 2014-04-29 MED ORDER — METHYLPREDNISOLONE ACETATE 40 MG/ML INJ SUSP (RADIOLOG
120.0000 mg | Freq: Once | INTRAMUSCULAR | Status: AC
  Administered 2014-04-29: 120 mg via EPIDURAL

## 2014-04-29 MED ORDER — IOHEXOL 180 MG/ML  SOLN
1.0000 mL | Freq: Once | INTRAMUSCULAR | Status: AC | PRN
  Administered 2014-04-29: 1 mL via EPIDURAL

## 2014-04-29 NOTE — Discharge Instructions (Signed)

## 2014-05-10 ENCOUNTER — Emergency Department: Payer: Self-pay | Admitting: Emergency Medicine

## 2014-05-11 LAB — BASIC METABOLIC PANEL
Anion Gap: 8 (ref 7–16)
BUN: 16 mg/dL (ref 7–18)
Calcium, Total: 8.7 mg/dL (ref 8.5–10.1)
Chloride: 105 mmol/L (ref 98–107)
Co2: 29 mmol/L (ref 21–32)
Creatinine: 0.8 mg/dL (ref 0.60–1.30)
EGFR (African American): >60
EGFR (Non-African Amer.): >60
Glucose: 101 mg/dL — ABNORMAL HIGH (ref 65–99)
Osmolality: 284 (ref 275–301)
Potassium: 4.2 mmol/L (ref 3.5–5.1)
Sodium: 142 mmol/L (ref 136–145)

## 2014-05-11 LAB — TROPONIN I
Troponin-I: <0.02 ng/mL
Troponin-I: <0.02 ng/mL

## 2014-05-11 LAB — CBC
HCT: 39.6 % (ref 35.0–47.0)
HGB: 13.2 g/dL (ref 12.0–16.0)
MCH: 30.4 pg (ref 26.0–34.0)
MCHC: 33.3 g/dL (ref 32.0–36.0)
MCV: 91 fL (ref 80–100)
Platelet: 324 10*3/uL (ref 150–440)
RBC: 4.33 10*6/uL (ref 3.80–5.20)
RDW: 14 % (ref 11.5–14.5)
WBC: 8.5 10*3/uL (ref 3.6–11.0)

## 2014-05-11 LAB — D-DIMER(ARMC): D-Dimer: 235 ng/ml

## 2014-05-13 ENCOUNTER — Encounter (HOSPITAL_COMMUNITY): Payer: Self-pay

## 2014-05-13 ENCOUNTER — Encounter (HOSPITAL_COMMUNITY)
Admission: RE | Admit: 2014-05-13 | Discharge: 2014-05-13 | Disposition: A | Discharge status: Home / Self Care | Payer: Medicare Other | Source: Ambulatory Visit | Attending: Neurosurgery | Admitting: Neurosurgery

## 2014-05-13 DIAGNOSIS — Z01818 Encounter for other preprocedural examination: Secondary | ICD-10-CM | POA: Diagnosis not present

## 2014-05-13 DIAGNOSIS — I10 Essential (primary) hypertension: Secondary | ICD-10-CM | POA: Insufficient documentation

## 2014-05-13 DIAGNOSIS — K219 Gastro-esophageal reflux disease without esophagitis: Secondary | ICD-10-CM | POA: Diagnosis not present

## 2014-05-13 DIAGNOSIS — Z01812 Encounter for preprocedural laboratory examination: Secondary | ICD-10-CM | POA: Insufficient documentation

## 2014-05-13 DIAGNOSIS — I440 Atrioventricular block, first degree: Secondary | ICD-10-CM | POA: Insufficient documentation

## 2014-05-13 HISTORY — DX: Nausea with vomiting, unspecified: R11.2

## 2014-05-13 HISTORY — DX: Anxiety disorder, unspecified: F41.9

## 2014-05-13 HISTORY — DX: Personal history of other diseases of the digestive system: Z87.19

## 2014-05-13 HISTORY — DX: Other specified postprocedural states: Z98.890

## 2014-05-13 HISTORY — DX: Claustrophobia: F40.240

## 2014-05-13 HISTORY — DX: Essential (primary) hypertension: I10

## 2014-05-13 HISTORY — DX: Unspecified osteoarthritis, unspecified site: M19.90

## 2014-05-13 LAB — TYPE AND SCREEN
ABO/RH(D): A POS
Antibody Screen: NEGATIVE

## 2014-05-13 LAB — BASIC METABOLIC PANEL
Anion gap: 12 (ref 5–15)
BUN: 16 mg/dL (ref 6–23)
CO2: 27 mEq/L (ref 19–32)
Calcium: 9.4 mg/dL (ref 8.4–10.5)
Chloride: 103 mEq/L (ref 96–112)
Creatinine, Ser: 0.75 mg/dL (ref 0.50–1.10)
GFR calc Af Amer: >90 mL/min (ref >90)
GFR calc non Af Amer: 84 mL/min — ABNORMAL LOW (ref >90)
Glucose, Bld: 113 mg/dL — ABNORMAL HIGH (ref 70–99)
Potassium: 4.3 mEq/L (ref 3.7–5.3)
Sodium: 142 mEq/L (ref 137–147)

## 2014-05-13 LAB — SURGICAL PCR SCREEN
MRSA, PCR: NEGATIVE
Staphylococcus aureus: NEGATIVE

## 2014-05-13 LAB — CBC
HCT: 40.6 % (ref 36.0–46.0)
Hemoglobin: 13.6 g/dL (ref 12.0–15.0)
MCH: 29.9 pg (ref 26.0–34.0)
MCHC: 33.5 g/dL (ref 30.0–36.0)
MCV: 89.2 fL (ref 78.0–100.0)
Platelets: 328 10*3/uL (ref 150–400)
RBC: 4.55 MIL/uL (ref 3.87–5.11)
RDW: 13.4 % (ref 11.5–15.5)
WBC: 10 10*3/uL (ref 4.0–10.5)

## 2014-05-13 LAB — ABO/RH: ABO/RH(D): A POS

## 2014-05-13 NOTE — Progress Notes (Signed)
PCP is Dr Lisette Grinder Pt states that she went to the ED at [facility] 05-10-14 and they did CXR and EKG there, for chest pain.  No c/o pain at this time. Request sent to obtain copies of results.  Pt states that she is to see Dr Nehemiah Massed tomorrow. Ebony Hail called and informed and chart to be forward to her.

## 2014-05-13 NOTE — Pre-Procedure Instructions (Addendum)
Victoria Molina  05/13/2014   Your procedure is scheduled on:  Aug. 5 at 900  Report to [facility] Admitting at 0700 AM.  Call this number if you have problems the morning of surgery: [phone]   Remember:   Do not eat food or drink liquids after midnight.   Take these medicines the morning of surgery with A SIP OF WATER: amlodipine (norvasc), Vicodin/Norco (hydrocodone-acetaminophen), Isosorbide(Isordil), Claritin (loratadine), EYE drops if needed, Prilosec (omeprazole), Clorazepate (Tranxene) if needed  Stop taking Aspirin, Aleve, Ibuprofen, BC's, Goody's, Fish Oil, Herbal medications on July 27th   Do not wear jewelry, make-up or nail polish.  Do not wear lotions, powders, or perfumes. You may wear deodorant.  Do not shave 48 hours prior to surgery. Men may shave face and neck.  Do not bring valuables to the hospital.  [facility] is not responsible for any belongings or valuables.               Contacts, dentures or bridgework may not be worn into surgery.  Leave suitcase in the car. After surgery it may be brought to your room.  For patients admitted to the hospital, discharge time is determined by your treatment team.               Patients discharged the day of surgery will not be allowed to drive home.    Special Instructions: [place] - Preparing for Surgery  Before surgery, you can play an important role.  Because skin is not sterile, your skin needs to be as free of germs as possible.  You can reduce the number of germs on you skin by washing with CHG (chlorahexidine gluconate) soap before surgery.  CHG is an antiseptic cleaner which kills germs and bonds with the skin to continue killing germs even after washing.  Please DO NOT use if you have an allergy to CHG or antibacterial soaps.  If your skin becomes reddened/irritated stop using the CHG and inform your nurse when you arrive at Short Stay.  Do not shave (including legs and underarms) for at least 48  hours prior to the first CHG shower.  You may shave your face.  Please follow these instructions carefully:   1.  Shower with CHG Soap the night before surgery and the                                morning of Surgery.  2.  If you choose to wash your hair, wash your hair first as usual with your       normal shampoo.  3.  After you shampoo, rinse your hair and body thoroughly to remove the                      Shampoo.  4.  Use CHG as you would any other liquid soap.  You can apply chg directly       to the skin and wash gently with scrungie or a clean washcloth.  5.  Apply the CHG Soap to your body ONLY FROM THE NECK DOWN.        Do not use on open wounds or open sores.  Avoid contact with your eyes,       ears, mouth and genitals (private parts).  Wash genitals (private parts)       with your normal soap.  6.  Wash thoroughly, paying  special attention to the area where your surgery        will be performed.  7.  Thoroughly rinse your body with warm water from the neck down.  8.  DO NOT shower/wash with your normal soap after using and rinsing off       the CHG Soap.  9.  Pat yourself dry with a clean towel.            10.  Wear clean pajamas.            11.  Place clean sheets on your bed the night of your first shower and do not        sleep with pets.  Day of Surgery  Do not apply any lotions/deoderants the morning of surgery.  Please wear clean clothes to the hospital/surgery center.      Please read over the following fact sheets that you were given: Pain Booklet, Coughing and Deep Breathing, MRSA Information and Surgical Site Infection Prevention

## 2014-05-14 NOTE — Progress Notes (Addendum)
Anesthesia chart review: Patient is a 68 year old female scheduled for L2-3, L3-4 PLIF on 05/26/14 by Dr. Christella Noa.    History includes nonsmoker, hypertension, GERD, hiatal hernia, anxiety, arthritis, claustrophobia, postoperative nausea/vomiting, hysterectomy. PCP is Dr. Lisette Grinder.  She is scheduled to see Dr. Serafina Royals on 05/14/14 for evaluation of chest pain from ED visit at [facility] on 05/10/14.  Records are still pending.  EKG on 05/10/2014 [facility]) showed: Sinus rhythm with first-degree AV block.  Echocardiogram on 12/31/13 [facility]) showed: Normal LV systolic function. EF 55%. Moderate biatrial enlargement. Moderate mitral and mild tricuspid insufficiency.  Stress echo on 11/22/11 [facility]) showed: Normal treadmill ECG without evidence of ischemia or dysrhythmia. Excellent exercise tolerance for age. Normal stress echocardiographic images without evidence of ischemia.  CXR on 05/10/2014 [facility]) showed: The heart size and mediastinal contours are normal. Lungs are clear. No pleural effusion or pneumothorax. No acute osseous findings. Degenerative changes are noted within the lumbar spine. The subacromial space of the shoulders appear mildly narrowed suggesting rotator cuff impingement. No active cardiopulmonary process.  Preoperative labs noted.  I'll follow-up once her most recent cardiology records are received.  George [facility] Short Stay Center/Anesthesiology Phone [phone] 05/14/2014 5:27 PM  Addendum 05/14/2014 12:10 PM I reviewed Dr. Alveria Apley office note from 05/14/2014. He cleared patient for surgery without additional cardiac testing.

## 2014-05-17 NOTE — Progress Notes (Signed)
Pt called today stating that she was unclear as to when she is supposed to stop her Naprosyn. I told her that she could take it today and tomorrow, but none after tomorrow. She then stated that the only other medicine that she had for pain was Etodolac and I told her that she didn't need to take that either. I suggested that she call the surgeon's office and talk with them about what she needed to take for pain. She states she will do that as soon as she hangs up the phone.

## 2014-05-25 MED ORDER — CEFAZOLIN SODIUM-DEXTROSE 2-3 GM-% IV SOLR
2.0000 g | INTRAVENOUS | Status: AC
Start: 2014-05-26 — End: 2014-05-26
  Administered 2014-05-26 (×2): 2 g via INTRAVENOUS
  Filled 2014-05-25: qty 50

## 2014-05-26 ENCOUNTER — Encounter (HOSPITAL_COMMUNITY): Payer: Self-pay | Admitting: *Deleted

## 2014-05-26 ENCOUNTER — Encounter (HOSPITAL_COMMUNITY): Payer: Medicare Other | Admitting: Vascular Surgery

## 2014-05-26 ENCOUNTER — Inpatient Hospital Stay (HOSPITAL_COMMUNITY): Payer: Medicare Other

## 2014-05-26 ENCOUNTER — Inpatient Hospital Stay (HOSPITAL_COMMUNITY): Payer: Medicare Other | Admitting: Anesthesiology

## 2014-05-26 ENCOUNTER — Inpatient Hospital Stay (HOSPITAL_COMMUNITY)
Admission: RE | Admit: 2014-05-26 | Discharge: 2014-06-03 | DRG: 460 | Disposition: A | Discharge status: Skilled Nursing Facility | Payer: Medicare Other | Source: Ambulatory Visit | Attending: Neurosurgery | Admitting: Neurosurgery

## 2014-05-26 ENCOUNTER — Encounter (HOSPITAL_COMMUNITY)
Admission: RE | Disposition: A | Discharge status: Skilled Nursing Facility | Payer: Medicare Other | Source: Ambulatory Visit | Attending: Neurosurgery

## 2014-05-26 DIAGNOSIS — R51 Headache: Secondary | ICD-10-CM | POA: Diagnosis not present

## 2014-05-26 DIAGNOSIS — M7130 Other bursal cyst, unspecified site: Secondary | ICD-10-CM | POA: Diagnosis present

## 2014-05-26 DIAGNOSIS — Z9109 Other allergy status, other than to drugs and biological substances: Secondary | ICD-10-CM

## 2014-05-26 DIAGNOSIS — M5126 Other intervertebral disc displacement, lumbar region: Secondary | ICD-10-CM | POA: Diagnosis present

## 2014-05-26 DIAGNOSIS — Z87891 Personal history of nicotine dependence: Secondary | ICD-10-CM | POA: Diagnosis not present

## 2014-05-26 DIAGNOSIS — M549 Dorsalgia, unspecified: Secondary | ICD-10-CM | POA: Diagnosis present

## 2014-05-26 DIAGNOSIS — I10 Essential (primary) hypertension: Secondary | ICD-10-CM | POA: Diagnosis present

## 2014-05-26 DIAGNOSIS — Z882 Allergy status to sulfonamides status: Secondary | ICD-10-CM

## 2014-05-26 DIAGNOSIS — K5900 Constipation, unspecified: Secondary | ICD-10-CM | POA: Diagnosis not present

## 2014-05-26 DIAGNOSIS — K219 Gastro-esophageal reflux disease without esophagitis: Secondary | ICD-10-CM | POA: Diagnosis present

## 2014-05-26 DIAGNOSIS — Q762 Congenital spondylolisthesis: Secondary | ICD-10-CM

## 2014-05-26 DIAGNOSIS — M4316 Spondylolisthesis, lumbar region: Secondary | ICD-10-CM | POA: Diagnosis present

## 2014-05-26 HISTORY — DX: Low back pain: M54.5

## 2014-05-26 HISTORY — DX: Low back pain, unspecified: M54.50

## 2014-05-26 HISTORY — DX: Other chronic pain: G89.29

## 2014-05-26 HISTORY — PX: POSTERIOR LUMBAR FUSION: SHX6036

## 2014-05-26 HISTORY — DX: Reserved for concepts with insufficient information to code with codable children: IMO0002

## 2014-05-26 SURGERY — POSTERIOR LUMBAR FUSION 2 LEVEL
Anesthesia: General | Site: Back

## 2014-05-26 MED ORDER — ROCURONIUM BROMIDE 100 MG/10ML IV SOLN
INTRAVENOUS | Status: DC | PRN
  Administered 2014-05-26: 50 mg via INTRAVENOUS

## 2014-05-26 MED ORDER — PROPOFOL 10 MG/ML IV BOLUS
INTRAVENOUS | Status: AC
  Filled 2014-05-26: qty 20

## 2014-05-26 MED ORDER — POLYETHYLENE GLYCOL 3350 17 G PO PACK
17.0000 g | PACK | Freq: Every day | ORAL | Status: DC | PRN
  Filled 2014-05-26: qty 1

## 2014-05-26 MED ORDER — VITAMIN B-12 1000 MCG PO TABS
1500.0000 ug | ORAL_TABLET | Freq: Every day | ORAL | Status: DC
  Administered 2014-05-27 – 2014-06-03 (×8): 1500 ug via ORAL
  Filled 2014-05-26 (×9): qty 1

## 2014-05-26 MED ORDER — ONDANSETRON HCL 4 MG/2ML IJ SOLN
INTRAMUSCULAR | Status: AC
  Filled 2014-05-26: qty 2

## 2014-05-26 MED ORDER — GLYCOPYRROLATE 0.2 MG/ML IJ SOLN
INTRAMUSCULAR | Status: AC
Start: 2014-05-26 — End: 2014-05-26
  Filled 2014-05-26: qty 2

## 2014-05-26 MED ORDER — ARTIFICIAL TEARS OP OINT
TOPICAL_OINTMENT | OPHTHALMIC | Status: AC
  Filled 2014-05-26: qty 3.5

## 2014-05-26 MED ORDER — PROMETHAZINE HCL 25 MG/ML IJ SOLN
INTRAMUSCULAR | Status: AC
  Filled 2014-05-26: qty 1

## 2014-05-26 MED ORDER — SCOPOLAMINE 1 MG/3DAYS TD PT72
MEDICATED_PATCH | TRANSDERMAL | Status: AC
  Filled 2014-05-26: qty 1

## 2014-05-26 MED ORDER — LACTATED RINGERS IV SOLN
INTRAVENOUS | Status: DC | PRN
  Administered 2014-05-26 (×3): via INTRAVENOUS

## 2014-05-26 MED ORDER — DIAZEPAM 5 MG PO TABS
5.0000 mg | ORAL_TABLET | Freq: Four times a day (QID) | ORAL | Status: DC | PRN
  Administered 2014-05-27 – 2014-06-02 (×7): 5 mg via ORAL
  Filled 2014-05-26 (×7): qty 1

## 2014-05-26 MED ORDER — AMLODIPINE BESYLATE 5 MG PO TABS
5.0000 mg | ORAL_TABLET | Freq: Every day | ORAL | Status: DC
  Administered 2014-05-27 – 2014-06-03 (×7): 5 mg via ORAL
  Filled 2014-05-26 (×8): qty 1

## 2014-05-26 MED ORDER — VECURONIUM BROMIDE 10 MG IV SOLR
INTRAVENOUS | Status: AC
  Filled 2014-05-26: qty 10

## 2014-05-26 MED ORDER — OXYCODONE-ACETAMINOPHEN 5-325 MG PO TABS
1.0000 | ORAL_TABLET | ORAL | Status: DC | PRN
  Administered 2014-05-27: 2 via ORAL
  Administered 2014-05-27: 1 via ORAL
  Administered 2014-05-28 – 2014-05-29 (×5): 2 via ORAL
  Filled 2014-05-26 (×4): qty 2
  Filled 2014-05-26: qty 1
  Filled 2014-05-26 (×3): qty 2

## 2014-05-26 MED ORDER — HYDROMORPHONE HCL PF 1 MG/ML IJ SOLN
0.2500 mg | INTRAMUSCULAR | Status: DC | PRN
  Administered 2014-05-26: 0.5 mg via INTRAVENOUS

## 2014-05-26 MED ORDER — HYDROCODONE-ACETAMINOPHEN 5-325 MG PO TABS
1.0000 | ORAL_TABLET | ORAL | Status: DC | PRN
  Administered 2014-05-30: 1 via ORAL
  Administered 2014-05-31 – 2014-06-03 (×8): 2 via ORAL
  Administered 2014-06-03: 1 via ORAL
  Filled 2014-05-26: qty 2
  Filled 2014-05-26 (×2): qty 1
  Filled 2014-05-26 (×4): qty 2
  Filled 2014-05-26: qty 1
  Filled 2014-05-26 (×3): qty 2

## 2014-05-26 MED ORDER — POLYVINYL ALCOHOL 1.4 % OP SOLN
1.0000 [drp] | OPHTHALMIC | Status: DC | PRN
  Administered 2014-05-26: 1 [drp] via OPHTHALMIC
  Filled 2014-05-26: qty 15

## 2014-05-26 MED ORDER — ACETAMINOPHEN 325 MG PO TABS: 650.0000 mg | ORAL_TABLET | ORAL | Status: DC | PRN

## 2014-05-26 MED ORDER — HYDROMORPHONE HCL PF 1 MG/ML IJ SOLN
0.5000 mg | INTRAMUSCULAR | Status: DC | PRN
  Administered 2014-05-26 – 2014-05-31 (×9): 1 mg via INTRAVENOUS
  Filled 2014-05-26 (×10): qty 1

## 2014-05-26 MED ORDER — BUPIVACAINE HCL (PF) 0.5 % IJ SOLN
INTRAMUSCULAR | Status: DC | PRN
  Administered 2014-05-26: 30 mL

## 2014-05-26 MED ORDER — ACETAMINOPHEN 650 MG RE SUPP: 650.0000 mg | RECTAL | Status: DC | PRN

## 2014-05-26 MED ORDER — CEFAZOLIN SODIUM-DEXTROSE 2-3 GM-% IV SOLR
INTRAVENOUS | Status: AC
  Filled 2014-05-26: qty 50

## 2014-05-26 MED ORDER — MEPERIDINE HCL 25 MG/ML IJ SOLN: 6.2500 mg | INTRAMUSCULAR | Status: DC | PRN

## 2014-05-26 MED ORDER — PROPOFOL 10 MG/ML IV BOLUS
INTRAVENOUS | Status: DC | PRN
  Administered 2014-05-26: 80 mg via INTRAVENOUS

## 2014-05-26 MED ORDER — THROMBIN 20000 UNITS EX SOLR
CUTANEOUS | Status: DC | PRN
  Administered 2014-05-26: 11:00:00 via TOPICAL

## 2014-05-26 MED ORDER — DEXAMETHASONE SODIUM PHOSPHATE 10 MG/ML IJ SOLN
INTRAMUSCULAR | Status: DC | PRN
  Administered 2014-05-26: 4 mg via INTRAVENOUS

## 2014-05-26 MED ORDER — VITAMIN B 12 100 MCG PO LOZG: 1500.0000 ug | LOZENGE | Freq: Every day | ORAL | Status: DC

## 2014-05-26 MED ORDER — HYDROMORPHONE HCL PF 1 MG/ML IJ SOLN
INTRAMUSCULAR | Status: AC
  Filled 2014-05-26: qty 1

## 2014-05-26 MED ORDER — CLORAZEPATE DIPOTASSIUM 3.75 MG PO TABS
3.7500 mg | ORAL_TABLET | Freq: Three times a day (TID) | ORAL | Status: DC | PRN
  Administered 2014-05-30 – 2014-06-03 (×2): 3.75 mg via ORAL
  Filled 2014-05-26 (×2): qty 1

## 2014-05-26 MED ORDER — MIDAZOLAM HCL 2 MG/2ML IJ SOLN: 0.5000 mg | Freq: Once | INTRAMUSCULAR | Status: DC | PRN

## 2014-05-26 MED ORDER — FENTANYL CITRATE 0.05 MG/ML IJ SOLN
INTRAMUSCULAR | Status: DC | PRN
  Administered 2014-05-26: 50 ug via INTRAVENOUS
  Administered 2014-05-26: 100 ug via INTRAVENOUS
  Administered 2014-05-26 (×4): 50 ug via INTRAVENOUS

## 2014-05-26 MED ORDER — SODIUM CHLORIDE 0.9 % IV SOLN
INTRAVENOUS | Status: DC | PRN
  Administered 2014-05-26: 16:00:00 via INTRAVENOUS

## 2014-05-26 MED ORDER — 0.9 % SODIUM CHLORIDE (POUR BTL) OPTIME
TOPICAL | Status: DC | PRN
  Administered 2014-05-26 (×2): 1000 mL

## 2014-05-26 MED ORDER — OXYCODONE HCL 5 MG/5ML PO SOLN: 5.0000 mg | Freq: Once | ORAL | Status: DC | PRN

## 2014-05-26 MED ORDER — SODIUM CHLORIDE 0.9 % IJ SOLN
3.0000 mL | INTRAMUSCULAR | Status: DC | PRN
  Administered 2014-06-02: 3 mL via INTRAVENOUS

## 2014-05-26 MED ORDER — PHENYLEPHRINE HCL 10 MG/ML IJ SOLN
INTRAMUSCULAR | Status: DC | PRN
  Administered 2014-05-26 (×3): 80 ug via INTRAVENOUS

## 2014-05-26 MED ORDER — MENTHOL 3 MG MT LOZG: 1.0000 | LOZENGE | OROMUCOSAL | Status: DC | PRN

## 2014-05-26 MED ORDER — PHENOL 1.4 % MT LIQD
1.0000 | OROMUCOSAL | Status: DC | PRN
  Administered 2014-05-26: 1 via OROMUCOSAL
  Filled 2014-05-26: qty 177

## 2014-05-26 MED ORDER — FENTANYL CITRATE 0.05 MG/ML IJ SOLN
INTRAMUSCULAR | Status: AC
  Filled 2014-05-26: qty 5

## 2014-05-26 MED ORDER — NEOSTIGMINE METHYLSULFATE 10 MG/10ML IV SOLN
INTRAVENOUS | Status: DC | PRN
  Administered 2014-05-26: 4 mg via INTRAVENOUS

## 2014-05-26 MED ORDER — LIDOCAINE HCL (CARDIAC) 20 MG/ML IV SOLN
INTRAVENOUS | Status: AC
  Filled 2014-05-26: qty 5

## 2014-05-26 MED ORDER — METHYLCELLULOSE 1 % OP SOLN: 1.0000 [drp] | OPHTHALMIC | Status: DC | PRN

## 2014-05-26 MED ORDER — NEOSTIGMINE METHYLSULFATE 10 MG/10ML IV SOLN
INTRAVENOUS | Status: AC
  Filled 2014-05-26: qty 1

## 2014-05-26 MED ORDER — SCOPOLAMINE 1 MG/3DAYS TD PT72
MEDICATED_PATCH | TRANSDERMAL | Status: DC | PRN
  Administered 2014-05-26: 1 via TRANSDERMAL

## 2014-05-26 MED ORDER — MIDAZOLAM HCL 5 MG/5ML IJ SOLN
INTRAMUSCULAR | Status: DC | PRN
  Administered 2014-05-26: 2 mg via INTRAVENOUS

## 2014-05-26 MED ORDER — WHITE PETROLATUM GEL
Status: AC
  Administered 2014-05-26: 0.2
  Filled 2014-05-26: qty 5

## 2014-05-26 MED ORDER — OMEGA-3-ACID ETHYL ESTERS 1 G PO CAPS
1.0000 g | ORAL_CAPSULE | Freq: Every day | ORAL | Status: DC
  Administered 2014-05-27 – 2014-06-03 (×8): 1 g via ORAL
  Filled 2014-05-26 (×8): qty 1

## 2014-05-26 MED ORDER — ROCURONIUM BROMIDE 50 MG/5ML IV SOLN
INTRAVENOUS | Status: AC
  Filled 2014-05-26: qty 1

## 2014-05-26 MED ORDER — LACTATED RINGERS IV SOLN
Freq: Once | INTRAVENOUS | Status: AC
  Administered 2014-05-26: 09:00:00 via INTRAVENOUS

## 2014-05-26 MED ORDER — VECURONIUM BROMIDE 10 MG IV SOLR
INTRAVENOUS | Status: DC | PRN
  Administered 2014-05-26: 3 mg via INTRAVENOUS
  Administered 2014-05-26: 2 mg via INTRAVENOUS
  Administered 2014-05-26: 3 mg via INTRAVENOUS

## 2014-05-26 MED ORDER — LIDOCAINE HCL (CARDIAC) 20 MG/ML IV SOLN
INTRAVENOUS | Status: DC | PRN
  Administered 2014-05-26: 20 mg via INTRAVENOUS

## 2014-05-26 MED ORDER — MIDAZOLAM HCL 2 MG/2ML IJ SOLN
INTRAMUSCULAR | Status: AC
  Filled 2014-05-26: qty 2

## 2014-05-26 MED ORDER — DEXAMETHASONE SODIUM PHOSPHATE 4 MG/ML IJ SOLN
INTRAMUSCULAR | Status: AC
  Filled 2014-05-26: qty 1

## 2014-05-26 MED ORDER — SENNA 8.6 MG PO TABS
1.0000 | ORAL_TABLET | Freq: Two times a day (BID) | ORAL | Status: DC
  Administered 2014-05-26 – 2014-06-03 (×15): 8.6 mg via ORAL
  Filled 2014-05-26 (×15): qty 1

## 2014-05-26 MED ORDER — LORATADINE 10 MG PO TABS
10.0000 mg | ORAL_TABLET | Freq: Every day | ORAL | Status: DC
  Administered 2014-05-27 – 2014-06-03 (×8): 10 mg via ORAL
  Filled 2014-05-26 (×8): qty 1

## 2014-05-26 MED ORDER — MULTI-VITAMIN/MINERALS PO TABS: 1.0000 | ORAL_TABLET | Freq: Every day | ORAL | Status: DC

## 2014-05-26 MED ORDER — ISOSORBIDE DINITRATE 10 MG PO TABS
10.0000 mg | ORAL_TABLET | Freq: Three times a day (TID) | ORAL | Status: DC
  Administered 2014-05-26 – 2014-06-03 (×21): 10 mg via ORAL
  Filled 2014-05-26 (×25): qty 1

## 2014-05-26 MED ORDER — ONDANSETRON HCL 4 MG/2ML IJ SOLN
4.0000 mg | INTRAMUSCULAR | Status: DC | PRN
  Administered 2014-05-27 – 2014-05-30 (×3): 4 mg via INTRAVENOUS
  Filled 2014-05-26 (×3): qty 2

## 2014-05-26 MED ORDER — ARTIFICIAL TEARS OP OINT
TOPICAL_OINTMENT | OPHTHALMIC | Status: DC | PRN
  Administered 2014-05-26: 1 via OPHTHALMIC

## 2014-05-26 MED ORDER — GLYCOPYRROLATE 0.2 MG/ML IJ SOLN
INTRAMUSCULAR | Status: DC | PRN
  Administered 2014-05-26: 0.6 mg via INTRAVENOUS

## 2014-05-26 MED ORDER — OXYCODONE HCL 5 MG PO TABS: 5.0000 mg | ORAL_TABLET | Freq: Once | ORAL | Status: DC | PRN

## 2014-05-26 MED ORDER — SODIUM CHLORIDE 0.9 % IV SOLN: 250.0000 mL | INTRAVENOUS | Status: DC

## 2014-05-26 MED ORDER — POTASSIUM CHLORIDE IN NACL 20-0.9 MEQ/L-% IV SOLN
INTRAVENOUS | Status: DC
  Administered 2014-05-26: 20:00:00 via INTRAVENOUS

## 2014-05-26 MED ORDER — ADULT MULTIVITAMIN W/MINERALS CH
1.0000 | ORAL_TABLET | Freq: Every day | ORAL | Status: DC
  Administered 2014-05-27 – 2014-06-03 (×8): 1 via ORAL
  Filled 2014-05-26 (×8): qty 1

## 2014-05-26 MED ORDER — LIDOCAINE-EPINEPHRINE 0.5 %-1:200000 IJ SOLN
INTRAMUSCULAR | Status: DC | PRN
  Administered 2014-05-26: 20 mL

## 2014-05-26 MED ORDER — PROMETHAZINE HCL 25 MG/ML IJ SOLN
6.2500 mg | INTRAMUSCULAR | Status: DC | PRN
  Administered 2014-05-26: 6.25 mg via INTRAVENOUS

## 2014-05-26 MED ORDER — VITAMIN D (CHOLECALCIFEROL) 25 MCG (1000 UT) PO TABS
2000.0000 [IU] | ORAL_TABLET | Freq: Every day | ORAL | Status: DC
  Administered 2014-05-26 – 2014-06-03 (×9): 2000 [IU] via ORAL
  Filled 2014-05-26 (×9): qty 2

## 2014-05-26 MED ORDER — ONDANSETRON HCL 4 MG/2ML IJ SOLN
INTRAMUSCULAR | Status: DC | PRN
  Administered 2014-05-26: 4 mg via INTRAVENOUS

## 2014-05-26 MED ORDER — SODIUM CHLORIDE 0.9 % IJ SOLN
3.0000 mL | Freq: Two times a day (BID) | INTRAMUSCULAR | Status: DC
  Administered 2014-05-26 – 2014-06-03 (×13): 3 mL via INTRAVENOUS

## 2014-05-26 MED ORDER — PANTOPRAZOLE SODIUM 40 MG PO TBEC
40.0000 mg | DELAYED_RELEASE_TABLET | Freq: Every day | ORAL | Status: DC
  Administered 2014-05-26 – 2014-06-03 (×9): 40 mg via ORAL
  Filled 2014-05-26 (×10): qty 1

## 2014-05-26 SURGICAL SUPPLY — 75 items
ADH SKN CLS APL DERMABOND .7 (GAUZE/BANDAGES/DRESSINGS) ×1
APL SKNCLS STERI-STRIP NONHPOA (GAUZE/BANDAGES/DRESSINGS)
BAG DECANTER FOR FLEXI CONT (MISCELLANEOUS) ×3 IMPLANT
BENZOIN TINCTURE PRP APPL 2/3 (GAUZE/BANDAGES/DRESSINGS) IMPLANT
BLADE SURG ROTATE 9660 (MISCELLANEOUS) IMPLANT
BUR MATCHSTICK NEURO 3.0 LAGG (BURR) ×3 IMPLANT
CANISTER SUCT 3000ML (MISCELLANEOUS) ×3 IMPLANT
CLOSURE WOUND 1/2 X4 (GAUZE/BANDAGES/DRESSINGS)
CONT SPEC 4OZ CLIKSEAL STRL BL (MISCELLANEOUS) ×3 IMPLANT
COVER BACK TABLE 24X17X13 BIG (DRAPES) IMPLANT
DECANTER SPIKE VIAL GLASS SM (MISCELLANEOUS) ×3 IMPLANT
DERMABOND ADVANCED (GAUZE/BANDAGES/DRESSINGS) ×2
DERMABOND ADVANCED .7 DNX12 (GAUZE/BANDAGES/DRESSINGS) ×1 IMPLANT
DRAPE C-ARM 42X72 X-RAY (DRAPES) ×6 IMPLANT
DRAPE LAPAROTOMY 100X72X124 (DRAPES) ×3 IMPLANT
DRAPE POUCH INSTRU U-SHP 10X18 (DRAPES) ×3 IMPLANT
DRAPE SURG 17X23 STRL (DRAPES) ×3 IMPLANT
DRSG OPSITE POSTOP 4X10 (GAUZE/BANDAGES/DRESSINGS) ×2 IMPLANT
DRSG TELFA 3X8 NADH (GAUZE/BANDAGES/DRESSINGS) IMPLANT
DURAPREP 26ML APPLICATOR (WOUND CARE) ×3 IMPLANT
ELECT REM PT RETURN 9FT ADLT (ELECTROSURGICAL) ×3
ELECTRODE REM PT RTRN 9FT ADLT (ELECTROSURGICAL) ×1 IMPLANT
GAUZE SPONGE 4X4 12PLY STRL (GAUZE/BANDAGES/DRESSINGS) IMPLANT
GAUZE SPONGE 4X4 16PLY XRAY LF (GAUZE/BANDAGES/DRESSINGS) IMPLANT
GLOVE BIO SURGEON STRL SZ 6.5 (GLOVE) ×4 IMPLANT
GLOVE BIO SURGEONS STRL SZ 6.5 (GLOVE) ×4
GLOVE BIOGEL PI IND STRL 7.0 (GLOVE) IMPLANT
GLOVE BIOGEL PI IND STRL 7.5 (GLOVE) IMPLANT
GLOVE BIOGEL PI INDICATOR 7.0 (GLOVE) ×6
GLOVE BIOGEL PI INDICATOR 7.5 (GLOVE) ×4
GLOVE ECLIPSE 6.5 STRL STRAW (GLOVE) ×6 IMPLANT
GLOVE ECLIPSE 7.5 STRL STRAW (GLOVE) ×10 IMPLANT
GLOVE EXAM NITRILE LRG STRL (GLOVE) IMPLANT
GLOVE EXAM NITRILE MD LF STRL (GLOVE) IMPLANT
GLOVE EXAM NITRILE XL STR (GLOVE) IMPLANT
GLOVE EXAM NITRILE XS STR PU (GLOVE) IMPLANT
GOWN STRL REUS W/ TWL LRG LVL3 (GOWN DISPOSABLE) ×2 IMPLANT
GOWN STRL REUS W/ TWL XL LVL3 (GOWN DISPOSABLE) IMPLANT
GOWN STRL REUS W/TWL 2XL LVL3 (GOWN DISPOSABLE) IMPLANT
GOWN STRL REUS W/TWL LRG LVL3 (GOWN DISPOSABLE) ×15
GOWN STRL REUS W/TWL XL LVL3 (GOWN DISPOSABLE) ×6
KIT BASIN OR (CUSTOM PROCEDURE TRAY) ×3 IMPLANT
KIT POSITION SURG JACKSON T1 (MISCELLANEOUS) ×3 IMPLANT
KIT ROOM TURNOVER OR (KITS) ×3 IMPLANT
NDL HYPO 25X1 1.5 SAFETY (NEEDLE) ×1 IMPLANT
NDL SPNL 18GX3.5 QUINCKE PK (NEEDLE) IMPLANT
NEEDLE HYPO 25X1 1.5 SAFETY (NEEDLE) ×6 IMPLANT
NEEDLE SPNL 18GX3.5 QUINCKE PK (NEEDLE) IMPLANT
NS IRRIG 1000ML POUR BTL (IV SOLUTION) ×3 IMPLANT
PACK LAMINECTOMY NEURO (CUSTOM PROCEDURE TRAY) ×3 IMPLANT
PAD ARMBOARD 7.5X6 YLW CONV (MISCELLANEOUS) ×9 IMPLANT
PAD DRESSING TELFA 3X8 NADH (GAUZE/BANDAGES/DRESSINGS) IMPLANT
ROD 70MM (Rod) ×3 IMPLANT
ROD PREBENT 80MM LUMBAR (Rod) ×2 IMPLANT
ROD SPNL 70XPREBNT NS MAS (Rod) IMPLANT
SCREW LOCK (Screw) ×18 IMPLANT
SCREW LOCK FXNS SPNE MAS PL (Screw) IMPLANT
SCREW SHANK 5.0X30MM (Screw) ×8 IMPLANT
SCREW SHANK 5.0X35 (Screw) ×2 IMPLANT
SCREW SHANKS 5.5X35 (Screw) ×2 IMPLANT
SCREW TULIP 5.5 (Screw) ×12 IMPLANT
SPACER OPAL 10X24 12MM (Spacer) ×4 IMPLANT
SPACER PLIF FR DRY 11MM (Bone Implant) ×4 IMPLANT
SPONGE LAP 4X18 X RAY DECT (DISPOSABLE) IMPLANT
SPONGE SURGIFOAM ABS GEL 100 (HEMOSTASIS) ×3 IMPLANT
STRIP CLOSURE SKIN 1/2X4 (GAUZE/BANDAGES/DRESSINGS) IMPLANT
SUT PROLENE 6 0 BV (SUTURE) ×6 IMPLANT
SUT VIC AB 0 CT1 18XCR BRD8 (SUTURE) ×1 IMPLANT
SUT VIC AB 0 CT1 8-18 (SUTURE) ×3
SUT VIC AB 2-0 CT1 18 (SUTURE) ×3 IMPLANT
SUT VIC AB 3-0 SH 8-18 (SUTURE) ×3 IMPLANT
SYR 20ML ECCENTRIC (SYRINGE) ×3 IMPLANT
TOWEL OR 17X24 6PK STRL BLUE (TOWEL DISPOSABLE) ×3 IMPLANT
TOWEL OR 17X26 10 PK STRL BLUE (TOWEL DISPOSABLE) ×3 IMPLANT
WATER STERILE IRR 1000ML POUR (IV SOLUTION) ×3 IMPLANT

## 2014-05-26 NOTE — H&P (Signed)
BP 141/76  Pulse 76  Temp(Src) 97.4 F (36.3 C) (Oral)  Resp 18  Ht 5\' 2"  (1.575 m)  Wt 67.331 kg (148 lb 7 oz)  BMI 27.14 kg/m2  SpO2 100%   [name] was doing well. I had seen her last in February. She was doing so well that she actually cancelled her injection. She then unfortunately took a fall in March helping somebody. Ever since then she has had a great deal of pain in the lower back. As a result of that she comes in today with new complaints about pain in both buttocks and some pain that does run down the leg. She says it does not feel like shooting pain, but it is more like that nerve pain.     INTERVAL PFSH:                                  She is currently taking amlodipine, omeprazole, and Temazepam. SHE IS ALLERGIC TO IODINE AND SULFA CONTAINING MEDICATIONS.     PHYSICAL EXAMINATION:                    On exam she is alert, oriented x4, and answering all questions appropriately. Memory, language attention span, and fund of knowledge are normal. Normal strength in the upper and lower extremities. Gait is mildly antalgic although steady. Muscle tone, bulk, and coordination are normal. Height is 62 inches. Weight is 153.8 pounds. BMI is 28.13. Blood pressure is 155/78. Pulse is 84.     IMPRESSION/PLAN:                             As a result of that I did give her Lyrica. I had some samples. It is 50 mg which she is going to take one tablet a day. I will also go ahead and get her arranged to take more injections. We will just send that to [place] so that they can get that set up. I will see Victoria Molina again in two to three months.    I spoke with Victoria Molina today for approximately 15 minutes face-to-face time going over the lumbar MRI.  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  She is markedly stenotic in that area.  She has a listhesis there.  She also has listhesis where I operated at L 3-4.  I have told that if I do the fusion in  L2-3.  Given the listhesis in L3-4 and given the fact that we have already done decompression, I would also include that in the instrumentation and arthrodesis.  I  don't know that I would go back into the canal at L3-4 because she actually did well after the operation.  Her problem clearly is at L2-3.  She also has problems at L4-5.  She has right-sided disc and osteophyte and some foraminal narrowing and canal stenosis, but that again is nothing close to what's  happening at the  L2-3 and her problem is at L2-3.  I  am not trying to operate all over her spine, cause I don't think that is going to give her significant more relief than just tailoring this operation to her current problem.     PHYSICAL EXAMINATION:  Weight is 149 pounds, height is 5 feet 2 inches tall.  Temperature is 97.2, blood pressure 147/83, pulse is 89.     MEDICATIONS AND ALLERGIES:          She is taking amlodipine, lorazepam, Lyrica, omeprazole, and temazepam.  She said she took Lyrica, but wasn't sure if that made much difference, but she did.  She doesn't know if it made much difference because she was also taking Motrin with it.     IMPRESSION/PLAN:                             I gave Victoria Molina a sheet that goes over the lumbar fusion.  We went over the operation and showed her the model here in the office.  She is going to give [place] a call, probably after bowling league in August. I told her that there is certainly nothing emergent about this procedure and she can do whenever she so desires.

## 2014-05-26 NOTE — Anesthesia Preprocedure Evaluation (Addendum)
Anesthesia Evaluation  Patient identified by MRN, date of birth, ID band Patient awake    Reviewed: Allergy & Precautions, H&P , NPO status , Patient's Chart, lab work & pertinent test results  History of Anesthesia Complications (+) PONV and history of anesthetic complications  Airway Mallampati: II TM Distance: >3 FB Neck ROM: Full    Dental  (+) Teeth Intact, Caps, Dental Advisory Given   Pulmonary former smoker (quit 25 uears),  breath sounds clear to auscultation        Cardiovascular hypertension, Pt. on medications Rhythm:Regular Rate:Normal  3/15 Echo: Normal LV systolic function. EF 55%. Moderate biatrial enlargement. Moderate MR, mild TI. '13 Stress echo: Normal treadmill ECG without evidence of ischemia or dysrhythmia.    Neuro/Psych Chronic back and R leg pain: narcotics daily    GI/Hepatic Neg liver ROS, GERD-  Medicated and Controlled,  Endo/Other  negative endocrine ROS  Renal/GU negative Renal ROS     Musculoskeletal   Abdominal   Peds  Hematology negative hematology ROS (+)   Anesthesia Other Findings   Reproductive/Obstetrics                         Anesthesia Physical Anesthesia Plan  ASA: III  Anesthesia Plan: General   Post-op Pain Management:    Induction: Intravenous  Airway Management Planned: Oral ETT  Additional Equipment:   Intra-op Plan:   Post-operative Plan: Extubation in OR  Informed Consent: I have reviewed the patients History and Physical, chart, labs and discussed the procedure including the risks, benefits and alternatives for the proposed anesthesia with the patient or authorized representative who has indicated his/her understanding and acceptance.   Dental advisory given  Plan Discussed with: CRNA and Surgeon  Anesthesia Plan Comments: (Plan routine monitors, GETA)        Anesthesia Quick Evaluation

## 2014-05-26 NOTE — Op Note (Signed)
05/26/2014  4:38 PM  PATIENT:  Victoria Molina  68 y.o. 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  PROCEDURE:  Procedure(s): POSTERIOR LUMBAR FUSION 2 LEVEL  L23 L34 posterior lumbar interbody fusion with interbody prosthesis L2/3 23mm peek Opal cages, structural allograft L3/4 54mm, morselized autograft posterior lateral arthrodesis L2-3, morselized autograft  posterior segmental instrumentation L2-4, nuvasive  SURGEON:  Surgeon(s): Ashok Pall, MD Elaina Hoops, MD  ASSISTANTS:Cram, Dominica Severin  ANESTHESIA:   general  EBL:  Total I/O In: 2890 [I.V.:2750; Blood:140] Out: 1250 [Urine:800; Blood:450]  BLOOD ADMINISTERED:140 CC CELLSAVER  CELL SAVER GIVEN:yes  COUNT:per nursing  DRAINS: none   SPECIMEN:  No Specimen  DICTATION: Victoria Molina is a 68 y.o. female whom was taken to the operating room intubated, and placed under a general anesthetic without difficulty. A foley catheter was placed under sterile conditions. She was positioned prone on a Jackson stable with all pressure points properly padded.  Her lumbar region was prepped and draped in a sterile manner. I infiltrated 20cc's 1/2%lidocaine/1:2000,000 strength epinephrine into the planned incision. I opened the skin with a 10 blade and took the incision down to the thoracolumbar fascia. I exposed the lamina of L2,3, aden 4 in a subperiosteal fashion bilaterally. I confirmed my location with an intraoperative xray.  I placed self retaining retractors and started the decompression.  I decompressed the spinal canal via complete laminectomies of L2, L3, and hemilaminectomy of L4. While working through the scar at L3/4 where I had previously performed a decompression I opened the dura removing bone which was adherent to the dura. I closed the opening primarily without difficulty. I  continued with the decompression removing enough bone so that the L2,3, and L4 roots were well decompressed through the neural foramina bilaterally. This was well beyond the exposure needed for the Interbody placement. I used the drill, and Kerrison punches to decompress the canal and the nerve roots. The nerves had free egress from the canal at the end of the decompression, and the thecal sac was also full.  PLIF's were performed at L2/3,3/4 in the same fashion. I opened the disc space with a 15 blade then used a variety of instruments to remove the disc and prepare the space for the arthrodesis. I used curettes, rongeurs, punches, shavers for the disc space, and rasps in the discetomy. I measured the disc space and placed 12  Peek cages(Nuvasive) into the disc space at L2/3, and I used structural allograft 67mm x2 at L3/4(s). I also placed morselized autograft into the disc space at L2/3 anterior to the cages.  I decorticated the lateral bone at L2,3, and 4. We then placed morselized autograft on the decorticated surfaces to complete the posterolateral arthrodesis.  We placed pedicle screws at L2,3, and 4, using fluoroscopic guidance. I drilled a pilot hole, then cannulated the pedicle with a hand drill  at each site. We then tapped each pedicle, assessing each site for pedicle violations. No cutouts were appreciated. Screws Harlin Heys) were then placed at each site without difficulty. Final films were performed and all screws appeared to be in good position.  We closed the wound in a layered fashion. We approximated the thoracolumbar fascia, subcutaneous, and subcuticular planes with vicryl sutures. I used a running nylon suture to approximate the skin edges.  I used a honeycomb for a sterile dressing.     PLAN OF CARE:  Admit to inpatient   PATIENT DISPOSITION:  PACU - hemodynamically stable.   Delay start of Pharmacological VTE agent (>24hrs) due to surgical blood loss or risk of bleeding:   yes

## 2014-05-26 NOTE — Transfer of Care (Signed)
Immediate Anesthesia Transfer of Care Note  Patient: Victoria Molina  Procedure(s) Performed: Procedure(s): POSTERIOR LUMBAR FUSION 2 LEVEL  L23 L34 posterior lumbar interbody fusion with interbody prosthesis posterior lateral arthrodesis and posterior segmental instrumentation (N/A)  Patient Location: PACU  Anesthesia Type:General  Level of Consciousness: awake, alert  and oriented  Airway & Oxygen Therapy: Patient Spontanous Breathing and Patient connected to nasal cannula oxygen  Post-op Assessment: Report given to PACU RN and Post -op Vital signs reviewed and stable  Post vital signs: Reviewed and stable  Complications: No apparent anesthesia complications

## 2014-05-27 MED FILL — Heparin Sodium (Porcine) Inj 1000 Unit/ML: INTRAMUSCULAR | Qty: 30 | Status: AC

## 2014-05-27 MED FILL — Sodium Chloride IV Soln 0.9%: INTRAVENOUS | Qty: 3000 | Status: AC

## 2014-05-27 NOTE — Clinical Social Work Note (Signed)
CSW consulted for possible SNF placement at time of discharge. CSW to continue to follow and assist with discharge planning once disposition determined. Thank you for the referral.  Lubertha Sayres, MSW, [facility] Licensed Clinical Social Worker [phone] and [phone]

## 2014-05-27 NOTE — Clinical Social Work Psychosocial (Signed)
Clinical Social Work Department BRIEF PSYCHOSOCIAL ASSESSMENT 05/27/2014  Patient:  Victoria Molina, Victoria Molina     Account Number:  [id number]     Admit date:  12/25/2013  Clinical Social Worker:  Delrae Sawyers  Date/Time:  05/27/2014 04:10 PM  Referred by:  Physician  Date Referred:  05/27/2014 Referred for  SNF Placement   Other Referral:   none.   Interview type:  Patient Other interview type:   none.    PSYCHOSOCIAL DATA Living Status:  HUSBAND Admitted from facility:   Level of care:   Primary support name:  Madina Galati Primary support relationship to patient:  SPOUSE Degree of support available:   Strong support system.    CURRENT CONCERNS Current Concerns  Post-Acute Placement   Other Concerns:   none.    SOCIAL WORK ASSESSMENT / PLAN CSW received consult for SNF placement at time of discharge. CSW met with pt at bedside to discuss discharge disposition. Pt stated she was interested in pursuing SNF placement due to pt being home alone during the day. Pt expressed interest in [facility]. CSW to continue to follow and assist with discharge planning needs.   Assessment/plan status:  Psychosocial Support/Ongoing Assessment of Needs Other assessment/ plan:   none.   Information/referral to community resources:   [facility] SNF bed offes.    PATIENT'S/FAMILY'S RESPONSE TO PLAN OF CARE: Pt understanding and agreeable of CSW plan of care. Pt expressed no further questions or concerns at this time.       Lubertha Sayres, MSW, [facility] Licensed Clinical Social Worker [phone] and [phone]

## 2014-05-27 NOTE — Progress Notes (Signed)
Patient ID: Victoria Molina, female   DOB: 31-Jan-1946, 68 y.o.   MRN: [id number] BP 121/54  Pulse 87  Temp(Src) 98.9 F (37.2 C) (Oral)  Resp 18  Ht 5\' 2"  (1.575 m)  Wt 67.331 kg (148 lb 7 oz)  BMI 27.14 kg/m2  SpO2 97% Alert and oriented x 4, speech is clear and fluent Wound is clean, dry Mild weakness right dorsiflexors 4/5. Not expected, certainly nothing unusual during the case. Will monitor closely.  Overall doing well.

## 2014-05-27 NOTE — Evaluation (Signed)
Physical Therapy Evaluation Patient Details Name: Victoria Molina MRN: [id number] DOB: Oct 04, 1946 Today's Date: 05/27/2014   History of Present Illness  68 yo female s/p L2-4 PLIF  Clinical Impression  Pt admitted with/for lumbar fusion surgery.  Pt currently limited functionally due to the problems listed. ( See problems list.)   Pt will benefit from PT to maximize function and safety in order to get ready for next venue listed below.     Follow Up Recommendations SNF    Equipment Recommendations  Other (comment) (TBA at next venue)    Recommendations for Other Services       Precautions / Restrictions Precautions Precautions: Back Precaution Comments: handout provided and reviewed in detail      Mobility  Bed Mobility Overal bed mobility: Needs Assistance Bed Mobility: Rolling;Sidelying to Sit;Sit to Sidelying Rolling: Min guard Sidelying to sit: Min assist     Sit to sidelying: Min assist General bed mobility comments: reinforced technique and practiced severl times, but still needing minimal assist   Transfers Overall transfer level: Needs assistance Equipment used: Rolling walker (2 wheeled) Transfers: Sit to/from Stand Sit to Stand: Min assist         General transfer comment: cues for hand placement and safety; steady assist  Ambulation/Gait Ambulation/Gait assistance: Min guard Ambulation Distance (Feet): 180 Feet Assistive device: Rolling walker (2 wheeled) Gait Pattern/deviations: Step-through pattern   Gait velocity interpretation: Below normal speed for age/gender General Gait Details: steady, but guarded.  Stairs Stairs: Yes Stairs assistance: Min guard Stair Management: One rail Right;Step to pattern;Forwards Number of Stairs: 3 General stair comments: steady,but guarded  Wheelchair Mobility    Modified Rankin (Stroke Patients Only)       Balance Overall balance assessment: No apparent balance deficits (not formally assessed)    Sitting balance-Leahy Scale: Good       Standing balance-Leahy Scale: Fair                               Pertinent Vitals/Pain      Home Living Family/patient expects to be discharged to:: Skilled nursing facility Living Arrangements: Spouse/significant other Available Help at Discharge: [place] Type of Home: House Home Access: Stairs to enter Entrance Stairs-Rails: Psychiatric nurse of Steps: 2-3 Home Layout: One level Home Equipment: None      Prior Function Level of Independence: Independent               Hand Dominance   Dominant Hand: Right    Extremity/Trunk Assessment   Upper Extremity Assessment: Defer to OT evaluation           Lower Extremity Assessment: Overall WFL for tasks assessed      Cervical / Trunk Assessment: Normal  Communication   Communication: No difficulties  Cognition Arousal/Alertness: Awake/alert Behavior During Therapy: WFL for tasks assessed/performed Overall Cognitive Status: Within Functional Limits for tasks assessed                      General Comments General comments (skin integrity, edema, etc.): reinforced all back care/prec. log roll, transition sit to/from supine, lifting restrictions, progression of activity.  Emphasized bed mobility.    Exercises        Assessment/Plan    PT Assessment Patient needs continued PT services  PT Diagnosis Generalized weakness   PT Problem List Decreased strength;Decreased mobility;Decreased knowledge of use of DME;Decreased knowledge of precautions;Pain  PT  Treatment Interventions DME instruction;Gait training;Functional mobility training;Therapeutic activities;Stair training;Patient/family education   PT Goals (Current goals can be found in the Care Plan section) Acute Rehab PT Goals Patient Stated Goal: to go to rehab before home for maybe 3 weeks PT Goal Formulation: With patient Time For Goal Achievement:  06/03/14 Potential to Achieve Goals: Good    Frequency Min 5X/week   Barriers to discharge Decreased caregiver support (husbance gone almost 10 hours/day)      Co-evaluation               End of Session   Activity Tolerance: Patient tolerated treatment well Patient left: in chair;with call bell/phone within reach;with family/visitor present Nurse Communication: Mobility status         Time: 1032-1110 PT Time Calculation (min): 38 min   Charges:   PT Evaluation $Initial PT Evaluation Tier I: 1 Procedure PT Treatments $Gait Training: 8-22 mins $Self Care/Home Management: 8-22   PT G Codes:          Mikhala Kenan, Tessie Fass 05/27/2014, 2:06 PM 05/27/2014  Donnella Sham, PT [phone]  (pager)

## 2014-05-27 NOTE — Evaluation (Signed)
Occupational Therapy Evaluation Patient Details Name: Victoria Molina MRN: [id number] DOB: October 22, 1946 Today's Date: 05/27/2014    History of Present Illness 68 yo female s/p L2-4 PLIF   Clinical Impression   Patient is s/p L2-4 PLIF surgery resulting in functional limitations due to the deficits listed below (see OT problem list).  Patient will benefit from skilled OT acutely to increase independence and safety with ADLS to allow discharge SNF. Ot to follow acutely for adl retraining with back precautions.     Follow Up Recommendations  SNF    Equipment Recommendations  3 in 1 bedside comode (IF TO D/C HOME)    Recommendations for Other Services       Precautions / Restrictions Precautions Precautions: Back Precaution Comments: handout provided and reviewed in detail      Mobility Bed Mobility Overal bed mobility: Needs Assistance Bed Mobility: Rolling;Sidelying to Sit;Supine to Sit Rolling: Min assist Sidelying to sit: Min assist Supine to sit: Min assist;HOB elevated     General bed mobility comments: hob 25 degrees and use of bed rail required. mod v/c to sequence task  Transfers Overall transfer level: Needs assistance Equipment used: Rolling walker (2 wheeled) Transfers: Sit to/from Stand Sit to Stand: Min assist         General transfer comment: cues for hand placement and safety    Balance Overall balance assessment: Needs assistance Sitting-balance support: Bilateral upper extremity supported;Feet supported Sitting balance-Leahy Scale: Good     Standing balance support: Bilateral upper extremity supported;During functional activity Standing balance-Leahy Scale: Fair                              ADL Overall ADL's : Needs assistance/impaired Eating/Feeding: Set up;Sitting   Grooming: Oral care;Set up;Sitting;Wash/dry face Grooming Details (indicate cue type and reason): reports water burning face when washing face Upper Body Bathing:  Minimal assitance;Sitting   Lower Body Bathing: Maximal assistance;Sit to/from stand           Toilet Transfer: Min guard;Ambulation;Regular Toilet;Grab bars;RW Armed forces technical officer Details (indicate cue type and reason): pt could benefit from 3n1 if to d/c home Toileting- Water quality scientist and Hygiene: Min guard;Sit to/from stand       Functional mobility during ADLs: Min guard;Rolling walker General ADL Comments: Pt educated on back precautions with adls and bed mobility. pt requesting SNf before dc home due to decr mobility from bed. Pt reports minimal (A) from husband due to being in the house only short periods of the day     Vision                     Perception     Praxis      Pertinent Vitals/Pain 7 out 10 RN providing IV pain medication Pt upright in chair and states "oh this feels so much better than that bed"     Hand Dominance Right   Extremity/Trunk Assessment Upper Extremity Assessment Upper Extremity Assessment: Overall WFL for tasks assessed   Lower Extremity Assessment Lower Extremity Assessment: Defer to PT evaluation   Cervical / Trunk Assessment Cervical / Trunk Assessment: Normal   Communication Communication Communication: No difficulties   Cognition Arousal/Alertness: Awake/alert Behavior During Therapy: WFL for tasks assessed/performed Overall Cognitive Status: Within Functional Limits for tasks assessed                     General Comments  Exercises       Shoulder Instructions      Home Living Family/patient expects to be discharged to:: Skilled nursing facility Living Arrangements: Spouse/significant other Available Help at Discharge: [place] Type of Home: House Home Access: Stairs to enter     Home Layout: One level     Bathroom Shower/Tub: Occupational psychologist: Standard     Home Equipment: None          Prior Functioning/Environment Level of Independence:  Independent             OT Diagnosis: Generalized weakness;Acute pain   OT Problem List: Decreased strength;Decreased activity tolerance;Impaired balance (sitting and/or standing);Decreased safety awareness;Decreased knowledge of use of DME or AE;Decreased knowledge of precautions;Pain   OT Treatment/Interventions: Self-care/ADL training;Therapeutic exercise;DME and/or AE instruction;Therapeutic activities;Balance training;Patient/family education    OT Goals(Current goals can be found in the care plan section) Acute Rehab OT Goals Patient Stated Goal: to go to rehab before home for maybe 3 weeks OT Goal Formulation: With patient Time For Goal Achievement: 06/10/14 Potential to Achieve Goals: Good  OT Frequency: Min 2X/week   Barriers to D/C:            Co-evaluation              End of Session Equipment Utilized During Treatment: Gait belt;Rolling walker Nurse Communication: Mobility status;Precautions  Activity Tolerance: Patient tolerated treatment well Patient left: in chair;with call bell/phone within reach;with family/visitor present   Time: 3825-0539 OT Time Calculation (min): 31 min Charges:  OT General Charges $OT Visit: 1 Procedure OT Evaluation $Initial OT Evaluation Tier I: 1 Procedure OT Treatments $Self Care/Home Management : 23-37 mins G-Codes:    Peri Maris 06-08-2014, 10:24 AM Pager: [phone]

## 2014-05-27 NOTE — Anesthesia Postprocedure Evaluation (Signed)
  Anesthesia Post-op Note  Patient: Victoria Molina  Procedure(s) Performed: Procedure(s): POSTERIOR LUMBAR FUSION 2 LEVEL  L23 L34 posterior lumbar interbody fusion with interbody prosthesis posterior lateral arthrodesis and posterior segmental instrumentation (N/A)  Patient Location: PACU  Anesthesia Type:General  Level of Consciousness: awake, alert , oriented, patient cooperative and responds to stimulation  Airway and Oxygen Therapy: Patient Spontanous Breathing and Patient connected to nasal cannula oxygen  Post-op Pain: mild  Post-op Assessment: Post-op Vital signs reviewed, Patient's Cardiovascular Status Stable, Respiratory Function Stable, Patent Airway, No signs of Nausea or vomiting and Pain level controlled  Post-op Vital Signs: Reviewed and stable  Last Vitals:  Filed Vitals:   05/27/14 0952  BP: 114/60  Pulse: 77  Temp: 37.4 C  Resp: 18    Complications: No apparent anesthesia complications

## 2014-05-27 NOTE — Progress Notes (Signed)
Utilization review completed. Tayvien Kane, RN, BSN. 

## 2014-05-28 NOTE — Clinical Social Work Placement (Addendum)
Clinical Social Work Department CLINICAL SOCIAL WORK PLACEMENT NOTE 05/28/2014  Patient:  Victoria Molina, Victoria Molina  Account Number:  [id number] Admit date:  12/25/2013  Clinical Social Worker:  Delrae Sawyers  Date/time:  05/28/2014 04:16 PM  Clinical Social Work is seeking post-discharge placement for this patient at the following level of care:   [place]   (*CSW will update this form in Epic as items are completed)   05/27/2014  Patient/family provided with [place] Department of Clinical Social Work's list of facilities offering this level of care within the geographic area requested by the patient (or if unable, by the patient's family).  05/27/2014  Patient/family informed of their freedom to choose among providers that offer the needed level of care, that participate in Medicare, Medicaid or managed care program needed by the patient, have an available bed and are willing to accept the patient.  05/27/2014  Patient/family informed of [facility]' ownership interest in [facility], as well as of the fact that they are under no obligation to receive care at this facility.  PASARR submitted to EDS on 05/27/2014 PASARR number received on 05/27/2014  FL2 transmitted to all facilities in geographic area requested by pt/family on  05/27/2014 FL2 transmitted to all facilities within larger geographic area on   Patient informed that his/her managed care company has contracts with or will negotiate with  certain facilities, including the following:     Patient/family informed of bed offers received:  05/28/2014 Patient chooses bed at [facility] Physician recommends and patient chooses bed at    Patient to be transferred to [facility] on  06/03/2014 Nonnie Done, [place] 06/03/2014) Patient to be transferred to facility by [place] Nonnie Done, [place] 06/03/2014) Patient and family notified of transfer on  06/03/2014 Nonnie Done, Latanya Presser 06/03/2014) Name of  family member notified:  Natale Milch, pt husband notified by [place].  Pt husband agreeable Loma Sousa 06/03/2014)  The following physician request were entered in Epic:   Additional Comments:  Lubertha Sayres, MSW, [facility] Licensed Clinical Social Worker [phone] and [phone]

## 2014-05-28 NOTE — Progress Notes (Signed)
Patient ID: Victoria Molina, female   DOB: 09-25-1946, 68 y.o.   MRN: [id number] BP 133/61  Pulse 90  Temp(Src) 99.8 F (37.7 C) (Oral)  Resp 18  Ht 5\' 2"  (1.575 m)  Wt 67.331 kg (148 lb 7 oz)  BMI 27.14 kg/m2  SpO2 97% Alert and oriented x 4 Moving all extremities Better day todAy, complaining of left lower extremity pain.

## 2014-05-28 NOTE — Clinical Social Work Note (Signed)
Pt has accepted [facility] bed offer once medically stable for discharge. CSW to continue to follow and assist with discharge planning.  Lubertha Sayres, MSW, [facility] Licensed Clinical Social Worker [phone] and [phone]

## 2014-05-28 NOTE — Care Management Note (Addendum)
  Page 1 of 1   06/03/2014     11:25:15 AM CARE MANAGEMENT NOTE 06/03/2014  Patient:  TORAH, PINNOCK   Account Number:  [id number]  Date Initiated:  05/28/2014  Documentation initiated by:  Lorne Skeens  Subjective/Objective Assessment:   Patient was admitted for PLIF. Lives at home with spouse.     Action/Plan:   Will follow for discharge needs pending PT/OT evals and physician orders.   Anticipated DC Date:     Anticipated DC Plan:  SKILLED NURSING FACILITY  In-house referral  Clinical Social Worker         Choice offered to / List presented to:             Status of service:  Completed, signed off Medicare Important Message given?  YES (If response is "NO", the following Medicare IM given date fields will be blank) Date Medicare IM given:  05/28/2014 Medicare IM given by:  Lorne Skeens Date Additional Medicare IM given:  06/03/2014 Additional Medicare IM given by:  Lorne Skeens  Discharge Disposition:  [place]  Per UR Regulation:  Reviewed for med. necessity/level of care/duration of stay  If discussed at [place] of Stay Meetings, dates discussed:    Comments:  06/03/14 [place], MSN, CM- Spoke with patient about Medicare Rights, patient with previous Medicare IM letter at bedside, declines additional copy. Plan is for discharge to SNF today  05/31/14 [place], MSN, CM- Met with patient to provide additional Medicare IM letter.    05/28/14 [place], MSN, CM- Met with patient to provide Medicare IM letter. Plan is for [facility] at discharge.

## 2014-05-28 NOTE — Evaluation (Signed)
Physical Therapy Evaluation Patient Details Name: FLORENCIA ZACCARO MRN: [id number] DOB: December 05, 1945 Today's Date: 05/28/2014   History of Present Illness  68 yo female s/p L2-4 PLIF  Clinical Impression  Progressing well.  Emphasized practicing bed mobility, progressive ambulation and education.    Follow Up Recommendations SNF    Equipment Recommendations  Rolling walker with 5" wheels    Recommendations for Other Services       Precautions / Restrictions Precautions Precautions: Back Precaution Comments: handout provided and reviewed in detail Restrictions Weight Bearing Restrictions: No      Mobility  Bed Mobility Overal bed mobility: Needs Assistance Bed Mobility: Rolling;Sidelying to Sit Rolling: Min assist (without rail) Sidelying to sit: Min assist       General bed mobility comments: reinforced technique, practiced sequencing.  still needeing min assist  Transfers Overall transfer level: Needs assistance Equipment used: Rolling walker (2 wheeled) Transfers: Sit to/from Stand Sit to Stand: Min guard         General transfer comment: cues for hand placement  Ambulation/Gait Ambulation/Gait assistance: Supervision Ambulation Distance (Feet): 180 Feet Assistive device: Rolling walker (2 wheeled) Gait Pattern/deviations: Step-through pattern Gait velocity: slower preferred   General Gait Details: steady, but mildly antalgic dip on the R with WB ( no drop of pelvis)  Stairs            Wheelchair Mobility    Modified Rankin (Stroke Patients Only)       Balance Overall balance assessment: No apparent balance deficits (not formally assessed)   Sitting balance-Leahy Scale: Good       Standing balance-Leahy Scale: Fair                               Pertinent Vitals/Pain Pain Assessment: 0-10 Pain Score: 5  Pain Location: incisional Pain Intervention(s): RN gave pain meds during session    Home Living                        Prior Function                 Hand Dominance        Extremity/Trunk Assessment                         Communication      Cognition Arousal/Alertness: Awake/alert Behavior During Therapy: WFL for tasks assessed/performed Overall Cognitive Status: Within Functional Limits for tasks assessed                      General Comments General comments (skin integrity, edema, etc.): reinforced all back care again for carry over.    Exercises        Assessment/Plan    PT Assessment    PT Diagnosis     PT Problem List    PT Treatment Interventions     PT Goals (Current goals can be found in the Care Plan section) Acute Rehab PT Goals Patient Stated Goal: to go to rehab before home for maybe 3 weeks PT Goal Formulation: With patient Time For Goal Achievement: 06/03/14 Potential to Achieve Goals: Good    Frequency Min 5X/week   Barriers to discharge        Co-evaluation               End of Session   Activity Tolerance: Patient tolerated treatment well Patient  left: in chair;with call bell/phone within reach Nurse Communication: Mobility status         Time: 2263-3354 PT Time Calculation (min): 25 min   Charges:     PT Treatments $Gait Training: 8-22 mins $Self Care/Home Management: 8-22   PT G Codes:          Langston [place], Tessie Fass 05/28/2014, 10:39 AM 05/28/2014  Donnella Sham, [address] [phone]  (pager)

## 2014-05-29 MED ORDER — POLYETHYLENE GLYCOL 3350 17 G PO PACK
17.0000 g | PACK | Freq: Every day | ORAL | Status: DC
  Administered 2014-05-29 – 2014-06-03 (×6): 17 g via ORAL
  Filled 2014-05-29 (×5): qty 1

## 2014-05-29 MED ORDER — DEXAMETHASONE 4 MG PO TABS
6.0000 mg | ORAL_TABLET | ORAL | Status: AC
  Administered 2014-05-29 (×4): 6 mg via ORAL
  Filled 2014-05-29 (×7): qty 1

## 2014-05-29 NOTE — Progress Notes (Signed)
Read, reviewed, edited and agree with student's findings and recommendations.  Ervie Mccard B. Kileen Lange, PT, DPT #[id number]  

## 2014-05-29 NOTE — Progress Notes (Signed)
Physical Therapy Treatment Patient Details Name: Victoria Molina MRN: [id number] DOB: 04-Oct-1946 Today's Date: 05/29/2014    History of Present Illness 68 yo female admitted 05/26/14 s/p L2-4 PLIF. PMHx of HTN, arthritis, L shoulder tendon tear, chronic LBP and lumbar disc and back surgery.    PT Comments    Pt mobilized 70' with RW and supervision for pt safety and dull pain in L buttock/hip. Pt recalled 2/3 back precautions and all precautions were reviewed with pt from handout. PT recommends SNF in order to increase pt's strength and independence and will continue to follow acutely for the below listed deficits.  Follow Up Recommendations  SNF     Equipment Recommendations  Rolling walker with 5" wheels       Precautions / Restrictions Precautions Precautions: Back Precaution Comments: Pt remembered 2/3 precautions. Reviewed handout/ precautions with pt. Restrictions Weight Bearing Restrictions: No    Mobility  Bed Mobility Overal bed mobility: Needs Assistance Bed Mobility: Rolling;Sidelying to Sit Rolling: Min guard Sidelying to sit: Min guard       General bed mobility comments: Min guard with HOB flat and without railing. Pt gets out of right side of bed at home so encouraged to practice here. Pt does not need assistance for bed mobility but does require extra time.  Transfers Overall transfer level: Needs assistance Equipment used: Rolling walker (2 wheeled) Transfers: Sit to/from Stand Sit to Stand: Min guard         General transfer comment: Min guard for pt safety. Pt did not require cues for hand placement today.  Ambulation/Gait Ambulation/Gait assistance: Supervision Ambulation Distance (Feet): 230 Feet Assistive device: Rolling walker (2 wheeled) Gait Pattern/deviations: Step-through pattern Gait velocity: Decreased Gait velocity interpretation: Below normal speed for age/gender General Gait Details: Steady, but guarded due to pain in left  buttock.        Balance Overall balance assessment: Needs assistance Sitting-balance support: Feet unsupported;No upper extremity supported Sitting balance-Leahy Scale: Good     Standing balance support: No upper extremity supported Standing balance-Leahy Scale: Fair Standing balance comment: Pt able to maintain balance without UE support while brushing teeth at the sink though may have been leaning against counter.                    Cognition Arousal/Alertness: Awake/alert Behavior During Therapy: WFL for tasks assessed/performed Overall Cognitive Status: Within Functional Limits for tasks assessed                             Pertinent Vitals/Pain Pain Assessment: 0-10 Pain Score: 4  Pain Location: Left buttock/hip Pain Descriptors / Indicators: Dull Pain Intervention(s): Repositioned;RN gave pain meds during session (Pt also mentioned a dull pain in her head (no # given))           PT Goals (current goals can now be found in the care plan section) Acute Rehab PT Goals Patient Stated Goal: to go to rehab before home for maybe 3 weeks PT Goal Formulation: With patient Time For Goal Achievement: 06/03/14 Potential to Achieve Goals: Good Progress towards PT goals: Progressing toward goals    Frequency  Min 5X/week    PT Plan Current plan remains appropriate       End of Session Equipment Utilized During Treatment: Gait belt Activity Tolerance: Patient limited by pain Patient left: in chair;with call bell/phone within reach;with family/visitor present     Time: 7829-5621 PT Time Calculation (  min): 31 min  Charges:  $Gait Training: 8-22 mins $Therapeutic Activity: 8-22 mins                    G Codes:      Eber Jones, [place] [phone]

## 2014-05-29 NOTE — Progress Notes (Signed)
Patient ID: Victoria Molina, female   DOB: 23-Jun-1946, 68 y.o.   MRN: [id number] Afeb, vss No new neuro issues Still c/o pain in the left leg, though neuro stable. Will try some decadron for a few doses and see if that helps.

## 2014-05-30 MED ORDER — FLEET ENEMA 7-19 GM/118ML RE ENEM
1.0000 | ENEMA | Freq: Every day | RECTAL | Status: DC | PRN
  Administered 2014-05-30 – 2014-06-03 (×2): 1 via RECTAL
  Filled 2014-05-30 (×2): qty 1

## 2014-05-30 MED ORDER — BISACODYL 10 MG RE SUPP
10.0000 mg | Freq: Every day | RECTAL | Status: DC | PRN
  Administered 2014-05-30 – 2014-06-02 (×2): 10 mg via RECTAL
  Filled 2014-05-30 (×2): qty 1

## 2014-05-30 NOTE — Progress Notes (Signed)
Patient alert and oriented, vomited x1, zofran given, stated she did not have a bowel movement since the 4th, new orders given, miralax and supository and fleet enema, bm x1, impacted, small balls, patient states relief and decreased nausea, no c/o of pain only discomfort from bowel movement, as expected, family at bedside, ambulates well with walker, stand by assist, incision open to air, continuing to monitor

## 2014-05-30 NOTE — Progress Notes (Signed)
Patient vomited this morning, 30 ml, stated have been dry heaving for few days, last bm 4th, and medication not helping, MD notified

## 2014-05-30 NOTE — Progress Notes (Signed)
Patient ID: [name], female   DOB: [birth year], 68 y.o.   MRN: [id number] Subjective:  The patient is alert and pleasant. She complains of constipation.  Objective: Vital signs in last 24 hours: Temp:  [97.7 F (36.5 C)-98.4 F (36.9 C)] 97.7 F (36.5 C) (08/09 0622) Pulse Rate:  [64-86] 64 (08/09 0622) Resp:  [18-20] 18 (08/09 0622) BP: (117-151)/(56-76) 151/76 mmHg (08/09 0622) SpO2:  [96 %-98 %] 97 % (08/09 0622)  Intake/Output from previous day:   Intake/Output this shift:    Physical exam the patient is alert and oriented. She is moving her lower extremities well.  Lab Results: No results found for this basename: WBC, HGB, HCT, PLT,  in the last 72 hours BMET No results found for this basename: NA, K, CL, CO2, GLUCOSE, BUN, CREATININE, CALCIUM,  in the last 72 hours  Studies/Results: No results found.  Assessment/Plan: Postop day 4: We will add suppositories. She may go home tomorrow.  LOS: 4 days     Gleason Ardoin D 05/30/2014, 10:18 AM

## 2014-05-31 MED ORDER — MAGNESIUM HYDROXIDE 400 MG/5ML PO SUSP
30.0000 mL | Freq: Once | ORAL | Status: AC
  Administered 2014-05-31: 30 mL via ORAL
  Filled 2014-05-31: qty 30

## 2014-05-31 NOTE — Progress Notes (Signed)
Physical Therapy Treatment Patient Details Name: Victoria Molina MRN: [id number] DOB: 1946/07/29 Today's Date: 05/31/2014    History of Present Illness 68 yo female admitted 05/26/14 s/p L2-4 PLIF. PMHx of HTN, arthritis, L shoulder tendon tear, chronic LBP and lumbar disc and back surgery.    PT Comments    Pt mobilized 250' with RW and min guard/supervision for pt safety as pt complains of 5/10 dull pain in L buttock/hip. Pt attempted stairs today though was only able to complete one with bilateral UE support due to increased pain in L LE. PT recommends SNF to increase pt's strength and independence and will continue to follow acutely for the below listed deficits.  Follow Up Recommendations  SNF     Equipment Recommendations  Rolling walker with 5" wheels       Precautions / Restrictions Precautions Precautions: Fall;Back Precaution Comments: Pt remembered 3/3 precautions. Restrictions Weight Bearing Restrictions: No    Mobility  Bed Mobility Overal bed mobility: Needs Assistance Bed Mobility: Rolling;Sidelying to Sit Rolling: Supervision Sidelying to sit: Min guard       General bed mobility comments: Min guard from sidelying to sit due to permanent hump in bed (with HOB flat and no railing). Pt now states she gets out of the L side of the bed at home so this was practiced.  Transfers Overall transfer level: Needs assistance Equipment used: Rolling walker (2 wheeled) Transfers: Sit to/from Stand Sit to Stand: Min guard         General transfer comment: Min guard as pt takes increased time to transfer from sit to stand due to dull pain in L buttock though is able to without assistance. Pt pauses with increased flexion over right side due to pain in L buttock/hip.  Ambulation/Gait Ambulation/Gait assistance: Supervision;Min guard Ambulation Distance (Feet): 250 Feet Assistive device: Rolling walker (2 wheeled) Gait Pattern/deviations: Step-through pattern Gait  velocity: Decreased Gait velocity interpretation: Below normal speed for age/gender General Gait Details: Min guard/supervision for pt safety. Pt amb with decreased speed subjectively due to her L buttock/hip pain.   Stairs Stairs: Yes Stairs assistance: Min guard Stair Management: One rail Right;Step to pattern;Forwards;Sideways Number of Stairs: 1 General stair comments: Pt went up one stair using R railing and hand held assist on the left. Pt used a step-to pattern leading with R LE. Pt complained of increased pain in L LE after one step so went sideways back down holding onto the railing with bilateral UE.      Balance Overall balance assessment: Needs assistance Sitting-balance support: Feet supported;No upper extremity supported Sitting balance-Leahy Scale: Good Sitting balance - Comments: Pt able to maintain sitting balance EOB when donning back gown.   Standing balance support: Single extremity supported;During functional activity Standing balance-Leahy Scale: Poor Standing balance comment: Pt requires single UE support while brushing teeth at sink and bilateral UE support during amb.                    Cognition Arousal/Alertness: Awake/alert Behavior During Therapy: WFL for tasks assessed/performed Overall Cognitive Status: Within Functional Limits for tasks assessed                             Pertinent Vitals/Pain Pain Assessment: 0-10 Pain Score: 5  Pain Location: Back Pain Descriptors / Indicators: Dull Pain Intervention(s): Repositioned     05/31/14 1000  Home Living  Home Access Stairs to enter  Entrance Stairs-Number  of Steps 2-3 (In garage with no rail)  Entrance Stairs-Rails None  Home Layout One level  Home Equipment None  Additional Comments About 6 stairs in front with railings on both sides, 3 stairs in garage with no rail, and back entrance with no steps she plans to use.         PT Goals (current goals can now be found in  the care plan section) Acute Rehab PT Goals Patient Stated Goal: to go to rehab before home for maybe 3 weeks PT Goal Formulation: With patient Time For Goal Achievement: 06/03/14 Potential to Achieve Goals: Good Progress towards PT goals: Progressing toward goals    Frequency  Min 5X/week    PT Plan Current plan remains appropriate       End of Session Equipment Utilized During Treatment: Gait belt Activity Tolerance: Patient limited by pain Patient left: in chair;with call bell/phone within reach;with chair alarm set     Time: 0349-1791 PT Time Calculation (min): 29 min  Charges:   1 Gait 1 TA                    G CodesEber Jones, [place] [phone]

## 2014-05-31 NOTE — Progress Notes (Signed)
Pt requested pain medication, offered both percocet and vicodin, states she thinks the medication gives her hallucinations, she wants the IV medication. States the valium earlier did not take away her pain. Will need new oral pain med prescribed. Will continue to monitor.

## 2014-05-31 NOTE — Progress Notes (Signed)
Read, reviewed, edited and agree with student's findings and recommendations.  Alwin Lanigan B. Ladarius Seubert, PT, DPT #[phone]  

## 2014-05-31 NOTE — Progress Notes (Signed)
Patient ID: Victoria Molina, female   DOB: 04-07-1946, 68 y.o.   MRN: [id number] Subjective:  The patient is alert and pleasant. She complains of some postural headache. She wants to go into rehabilitation. She is having some left hip pain  Objective: Vital signs in last 24 hours: Temp:  [97.7 F (36.5 C)-98.5 F (36.9 C)] 97.9 F (36.6 C) (08/10 0929) Pulse Rate:  [63-104] 91 (08/10 0929) Resp:  [18] 18 (08/10 0929) BP: (138-154)/(63-83) 138/63 mmHg (08/10 0929) SpO2:  [97 %-100 %] 100 % (08/10 0929)  Intake/Output from previous day:   Intake/Output this shift: Total I/O In: 240 [P.O.:240] Out: -   Physical exam patient is alert and oriented. Her strength is normal in her lower extremities. Her wound is healing well without signs of infection or discharge.  Lab Results: No results found for this basename: WBC, HGB, HCT, PLT,  in the last 72 hours BMET No results found for this basename: NA, K, CL, CO2, GLUCOSE, BUN, CREATININE, CALCIUM,  in the last 72 hours  Studies/Results: No results found.  Assessment/Plan: Postop day #5: The patient seems to be progressing well. We'll await skilled nursing facility placement.  Headaches: The patient's wound looks good. We will observe this.  LOS: 5 days     Victoria Molina D 05/31/2014, 9:57 AM

## 2014-06-01 NOTE — Progress Notes (Signed)
Patient states allergy to percocet's, please d/c order.

## 2014-06-01 NOTE — Progress Notes (Signed)
Physical Therapy Treatment Patient Details Name: Victoria Molina MRN: [id number] DOB: 10-29-1945 Today's Date: 06/01/2014    History of Present Illness 68 yo female admitted 05/26/14 s/p L2-4 PLIF. PMHx of HTN, arthritis, L shoulder tendon tear, chronic LBP and lumbar disc and back surgery.    PT Comments    Pt mobilized 250' with RW and supervision for pt safety as pt continues to complain of pain in L buttock. Pt performed 3 stairs today min guard for pt safety, sideways leading with R UE, and using bilateral UE support of L railing. PT recommends SNF to increase pt's strength and independence and will continue to follow acutely for the below listed deficits.  Follow Up Recommendations  SNF     Equipment Recommendations  Rolling walker with 5" wheels       Precautions / Restrictions Precautions Precautions: Fall;Back Precaution Comments: Pt abided by back precautions during session. Restrictions Weight Bearing Restrictions: No    Mobility  Bed Mobility Overal bed mobility: Needs Assistance Bed Mobility: Rolling;Sidelying to Sit Rolling: Supervision Sidelying to sit: Min guard       General bed mobility comments: Min guard for LE sidelying to sit.  Transfers Overall transfer level: Needs assistance Equipment used: Rolling walker (2 wheeled) Transfers: Sit to/from Stand Sit to Stand: Min guard         General transfer comment: Min guard as pt takes increased time to transfer from sit to stand due to dull pain in L buttock though is able to without assistance. Pt pauses with increased flexion over right side due to pain in L buttock/hip.  Ambulation/Gait Ambulation/Gait assistance: Supervision Ambulation Distance (Feet): 250 Feet Assistive device: Rolling walker (2 wheeled) Gait Pattern/deviations: Step-through pattern Gait velocity: Decreased Gait velocity interpretation: Below normal speed for age/gender General Gait Details: Supervision for pt safety and amb  with decreased speed due to pain in L buttock.   Stairs Stairs: Yes Stairs assistance: Min guard Stair Management: One rail Left;Sideways;Step to pattern Number of Stairs: 3 General stair comments: Pt went up stairs sideways using step to pattern leading with R LE and holding onto railing with bilateral UE. Pt went down stairs sideways leading with L LE and holding onto railing with bilateral UE.      Balance Overall balance assessment: Needs assistance Sitting-balance support: No upper extremity supported;Feet supported Sitting balance-Leahy Scale: Good Sitting balance - Comments: Pt able to maintain balance sitting EOB without UE support.   Standing balance support: Bilateral upper extremity supported;During functional activity;No upper extremity supported Standing balance-Leahy Scale: Fair Standing balance comment: Pt able to maintain balance in standing without UE support though using bilateral UE support of RW during amb.                    Cognition Arousal/Alertness: Awake/alert Behavior During Therapy: WFL for tasks assessed/performed Overall Cognitive Status: Within Functional Limits for tasks assessed                             Pertinent Vitals/Pain Pain Assessment: 0-10 Pain Score: 6  Pain Location: L buttock, lower back Pain Descriptors / Indicators: Dull Pain Intervention(s): Repositioned     06/01/14 1100  Home Living  Additional Comments About 6 stairs in front with railings on both sides, 3 stairs in garage with no rail, and back entrance with one "stoop" she plans to use. Pt told PT today she has 11 stairs to her basement  for the laundry with railing on the right and will have these moved to the main level so she will not have to use those steps.          PT Goals (current goals can now be found in the care plan section) Acute Rehab PT Goals Patient Stated Goal: to go to rehab before home for maybe 3 weeks PT Goal Formulation: With  patient Time For Goal Achievement: 06/03/14 Potential to Achieve Goals: Good Progress towards PT goals: Progressing toward goals    Frequency  Min 5X/week    PT Plan Current plan remains appropriate       End of Session Equipment Utilized During Treatment: Gait belt Activity Tolerance: Patient limited by pain;Patient tolerated treatment well Patient left: in chair;with call bell/phone within reach;with chair alarm set     Time: 6294-7654 PT Time Calculation (min): 22 min  Charges:    1 Gait                  G Codes:      Eber Jones, [place] [phone]

## 2014-06-01 NOTE — Progress Notes (Signed)
UR complete.  Mcgregor Tinnon RN, MSN 

## 2014-06-01 NOTE — Progress Notes (Signed)
Occupational Therapy Treatment Patient Details Name: Victoria Molina MRN: [id number] DOB: [birth year] Today's Date: 06/01/2014    History of present illness 68 yo female admitted 05/26/14 s/p L2-4 PLIF. PMHx of HTN, arthritis, L shoulder tendon tear, chronic LBP and lumbar disc and back surgery.   OT comments  Pt progressing toward goals this session. Pt does not require AE for LB at this time. Pt does require min v/c for safety with back precautions during adls. Pt wants to d/c SNF due to decr caregiver assistance. Pt progressing quickly.   Follow Up Recommendations  SNF    Equipment Recommendations  3 in 1 bedside comode    Recommendations for Other Services      Precautions / Restrictions Precautions Precautions: Fall;Back       Mobility Bed Mobility Overal bed mobility: Needs Assistance Bed Mobility: Supine to Sit;Sit to Supine     Supine to sit: Supervision;HOB elevated Sit to supine: Supervision   General bed mobility comments: cues to avoid twisting. Requires HOB elevated at 20 degrees  Transfers Overall transfer level: Needs assistance   Transfers: Sit to/from Stand Sit to Stand: Min guard         General transfer comment: pushing up with one UE and pulling on RW with second UE> cues to avoid bending    Balance Overall balance assessment: Needs assistance         Standing balance support: No upper extremity supported;During functional activity Standing balance-Leahy Scale: Good                     ADL               Lower Body Bathing: Min guard;Sit to/from stand (able to cross bil LE and no need for AE)           Toilet Transfer: Min guard;Ambulation;Regular Museum/gallery exhibitions officer and Hygiene: Min guard;Sit to/from stand (cue to avoid twisting)       Functional mobility during ADLs: Min guard;Rolling walker General ADL Comments: Pt completed bed mobility, LB dressing, toilet transfer and sink level grooming.  pt requires v/c for safety wtih back precautions. pt attempting to twist several times during session      Vision                     Perception     Praxis      Cognition   Behavior During Therapy: [facility] for tasks assessed/performed Overall Cognitive Status: Within Functional Limits for tasks assessed                       Extremity/Trunk Assessment               Exercises     Shoulder Instructions       General Comments      Pertinent Vitals/ Pain       Pain Assessment: No/denies pain (remedicated and able to tolerate session)  Home Living                                          Prior Functioning/Environment              Frequency Min 2X/week     Progress Toward Goals  OT Goals(current goals can now be found in the care plan section)  Progress towards OT  goals: Progressing toward goals  Acute Rehab OT Goals Patient Stated Goal: to go to [facility] OT Goal Formulation: With patient Time For Goal Achievement: 06/10/14 Potential to Achieve Goals: Good ADL Goals Pt Will Perform Grooming: with supervision;standing Pt Will Perform Upper Body Bathing: with supervision;standing Pt Will Perform Lower Body Bathing: with min guard assist;sit to/from stand;with adaptive equipment Pt Will Transfer to Toilet: with supervision;bedside commode Additional ADL Goal #1: Pt will complete bed mobility supervision level without bed rails and HOB less than 20 degrees  Plan Discharge plan remains appropriate    Co-evaluation                 End of Session Equipment Utilized During Treatment: Gait belt;Rolling walker   Activity Tolerance Patient tolerated treatment well   Patient Left in bed;with call bell/phone within reach   Nurse Communication Mobility status;Precautions        Time: 1352-1401 (3 minutes previous attempt) OT Time Calculation (min): 9 min  Charges: OT General Charges $OT Visit: 1 Procedure OT  Treatments $Self Care/Home Management : 8-22 mins  Parke Poisson B 06/01/2014, 3:05 PM Pager: [phone]

## 2014-06-01 NOTE — Progress Notes (Signed)
Filed Vitals:   05/31/14 1743 05/31/14 2145 06/01/14 0203 06/01/14 0539  BP: 133/66 139/70 125/66 141/66  Pulse: 80 86 65 65  Temp: 97.8 F (36.6 C) 98.6 F (37 C) 97.6 F (36.4 C) 97.8 F (36.6 C)  TempSrc: Axillary Oral Oral Oral  Resp: 18 16 18 18   Height:      Weight:      SpO2: 100% 99% 98% 97%    Patient is continuing to make progress in her recovery. She's continued to undergo physical therapy. She does describe some discomfort in the left buttock. She also describes some occasional mild headache when up and about, but it can also resolve when she is walking, and was sitting up in bed she does not have any headache. Dr. Lacy Duverney operative note does describe a primary repair of a dural defect. Patient's wound is healing well, there is no erythema, swelling, or drainage.  We'll continue physical therapy, and anticipate discharge to skilled nursing facility for continued rehabilitation later this week.  Plan: Continue physical therapy.  Hosie Spangle, MD 06/01/2014, 8:21 AM

## 2014-06-01 NOTE — Progress Notes (Signed)
Read, reviewed, edited and agree with student's findings and recommendations.  Kaori Jumper B. Jamauri Kruzel, PT, DPT #[phone]  

## 2014-06-02 MED ORDER — HYDROCODONE-ACETAMINOPHEN 5-325 MG PO TABS: 1.0000 | ORAL_TABLET | ORAL | Status: DC | PRN

## 2014-06-02 NOTE — Progress Notes (Signed)
CSW has left multiple messages at [facility] of  re: pt's tx there this pm, with no return call.  CSW will continue to make contact with NH.  CSW has been in contact with Charge RN Roselyn Reef re: pt's tx status.

## 2014-06-02 NOTE — Progress Notes (Signed)
Physical Therapy Treatment Patient Details Name: Victoria Molina MRN: [id number] DOB: 15-Jul-1946 Today's Date: 06/02/2014    History of Present Illness 68 yo female admitted 05/26/14 s/p L2-4 PLIF. PMHx of HTN, arthritis, L shoulder tendon tear, chronic LBP and lumbar disc and back surgery.    PT Comments    Pt mobilized 42' with RW and supervision for pt safety. Pt remembered 2/3 back precautions and performed 6 stairs with min assist. PT recommends SNF to increase pt's strength and independence and will continue to follow acutely for the below listed deficits.  Follow Up Recommendations  SNF     Equipment Recommendations  Rolling walker with 5" wheels       Precautions / Restrictions Precautions Precautions: Fall;Back Precaution Comments: Pt remembered 2/3 precautions and reviewed  precaution with pt. Restrictions Weight Bearing Restrictions: No    Mobility   Transfers Overall transfer level: Needs assistance Equipment used: Rolling walker (2 wheeled) Transfers: Sit to/from Stand Sit to Stand: Min guard         General transfer comment: Pt used proper technique with RW. Min guard as pt takes increased time to transfer and continues to lean over R LE due to L buttock pain.  Ambulation/Gait Ambulation/Gait assistance: Supervision Ambulation Distance (Feet): 255 Feet Assistive device: Rolling walker (2 wheeled) Gait Pattern/deviations: Step-through pattern Gait velocity: Decreased Gait velocity interpretation: Below normal speed for age/gender General Gait Details: Pt prefers to amb at decreased speed. Pt complains of pain on R heel and educated on wearing shoes to increase support.    Stairs Stairs: Yes Stairs assistance: Min assist Stair Management: One rail Left;Step to pattern;Sideways Number of Stairs: 6 General stair comments: Pt went up 6 stairs using step-to pattern leading with stronger R LE and coming down stairs with L LE. Pt used bilateral UE of railing  and min assist for pt safety. Pt subjectively stated she was in less pain with stairs today than yesterday.      Balance Overall balance assessment: Needs assistance Sitting-balance support: No upper extremity supported;Feet supported Sitting balance-Leahy Scale: Good     Standing balance support: Single extremity supported;During functional activity Standing balance-Leahy Scale: Fair Standing balance comment: Pt able to maintain balance at sink with L UE supported.                    Cognition Arousal/Alertness: Awake/alert Behavior During Therapy: WFL for tasks assessed/performed Overall Cognitive Status: Within Functional Limits for tasks assessed                             Pertinent Vitals/Pain Pain Assessment: 0-10 Pain Score: 4  (3 headache) Pain Location: L buttock (Also complains of R heel pain) Pain Descriptors / Indicators: Dull Pain Intervention(s): Repositioned           PT Goals (current goals can now be found in the care plan section) Acute Rehab PT Goals Patient Stated Goal: to go to [facility] PT Goal Formulation: With patient Time For Goal Achievement: 06/03/14 Potential to Achieve Goals: Good Progress towards PT goals: Progressing toward goals    Frequency  Min 5X/week    PT Plan Current plan remains appropriate       End of Session Equipment Utilized During Treatment: Gait belt Activity Tolerance: Patient limited by pain;Patient tolerated treatment well Patient left: in chair;with call bell/phone within reach;with chair alarm set     Time: 2706-2376 PT Time Calculation (min): 19  min  Charges:  $Gait Training: 8-22 mins                    G Codes:      Eber Jones, [place] [phone]

## 2014-06-02 NOTE — Progress Notes (Signed)
Read, reviewed, edited and agree with student's findings and recommendations.  Rhodie Cienfuegos B. Anevay Campanella, PT, DPT #[phone]  

## 2014-06-02 NOTE — Progress Notes (Addendum)
[facility] called by nursing staff as well as SW, no return.  MD notified about events.  Will update pt and MD if any new updates are available.

## 2014-06-02 NOTE — Discharge Summary (Signed)
Physician Discharge Summary  Patient ID: Victoria Molina MRN: [id number] DOB/AGE: Feb 04, 1946 68 y.o.  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 Christella Noa, who admitted the patient, and performed a 2 level L2-3 and L3-4 lumbar decompression, PLIF, and PLA. Postoperatively she is made gradual progress. She's been working with physical therapy and occupational therapy. Dr. Christella Noa made arrangements for her to be transferred to a skilled nursing facility for continued rehabilitation following surgery. The patient is being transferred to [facility]. She is to followup with Dr. Christella Noa for suture removal on Monday, August 17 or Tuesday, August 18. She can shower, and shower water can run on the incision. She is to continue PT and OT.  Discharge Exam: Blood pressure 120/64, pulse 71, temperature 97.2 F (36.2 C), temperature source Oral, resp. rate 18, height 5\' 2"  (1.575 m), weight 67.331 kg (148 lb 7 oz), SpO2 98.00%.  Disposition: SNF     Medication List         amLODipine 5 MG tablet  Commonly known as:  NORVASC  Take 5 mg by mouth daily.     clorazepate 3.75 MG tablet  Commonly known as:  TRANXENE  Take 3.75 mg by mouth 3 (three) times daily as needed for anxiety.     HYDROcodone-acetaminophen 5-325 MG per tablet  Commonly known as:  NORCO/VICODIN  Take 1 tablet by mouth every 4 (four) hours as needed for severe pain.     HYDROcodone-acetaminophen 5-325 MG per tablet  Commonly known as:  NORCO/VICODIN  Take 1-2 tablets by mouth every 4 (four) hours as needed for moderate pain or severe pain (mild pain).     isosorbide dinitrate 10 MG tablet  Commonly  known as:  ISORDIL  Take 10 mg by mouth 3 (three) times daily.     JOINT SUPPORT PO  Take 1 tablet by mouth daily.     loratadine 10 MG tablet  Commonly known as:  CLARITIN  Take 10 mg by mouth daily.     methylcellulose 1 % ophthalmic solution  Commonly known as:  ARTIFICIAL TEARS  Place 1 drop into both eyes as needed (dry eyes).     multivitamin with minerals tablet  Take 1 tablet by mouth daily.     naproxen 500 MG tablet  Commonly known as:  NAPROSYN  Take 500 mg by mouth 2 (two) times daily as needed for mild pain.     omega-3 acid ethyl esters 1 G capsule  Commonly known as:  LOVAZA  Take 1 g by mouth daily.     omeprazole 20 MG capsule  Commonly known as:  PRILOSEC  Take 20 mg by mouth 2 (two) times daily before a meal.     polyethylene glycol packet  Commonly known as:  MIRALAX / GLYCOLAX  Take 17 g by mouth daily as needed for mild constipation.     VITAMIN B 12 PO  Take 1,500 mcg by mouth daily.     Vitamin D (Cholecalciferol) 1000 UNITS Tabs  Take 2,000 Units by mouth daily.         Signed: Hosie Spangle, MD 06/02/2014, 5:57 PM

## 2014-06-03 NOTE — Progress Notes (Signed)
Physical Therapy Treatment Patient Details Name: Victoria Molina MRN: [id number] DOB: May 14, 1946 Today's Date: 06/03/2014    History of Present Illness 68 yo female admitted 05/26/14 s/p L2-4 PLIF. PMHx of HTN, arthritis, L shoulder tendon tear, chronic LBP and lumbar disc and back surgery.    PT Comments    Pt mobilized 67' with RW and supervision for pt safety. Pt continues to amb with decreased gait speed (today 1.14 ft/sec) indicative of an increased falls risk. PT continues to recommend SNF in order to increase pt strength and independence before returning home.  Follow Up Recommendations  SNF     Equipment Recommendations  Rolling walker with 5" wheels       Precautions / Restrictions Precautions Precautions: Fall;Back Restrictions Weight Bearing Restrictions: No    Mobility   Transfers Overall transfer level: Needs assistance Equipment used: Rolling walker (2 wheeled) Transfers: Sit to/from Stand Sit to Stand: Supervision         General transfer comment: Pt used proper technique with walker and transitioned faster today than in previous treatments. Pt does continue to lean right during transfer.  Ambulation/Gait Ambulation/Gait assistance: Supervision Ambulation Distance (Feet): 270 Feet Assistive device: Rolling walker (2 wheeled) Gait Pattern/deviations: Step-through pattern Gait velocity: Decreased Gait velocity interpretation: <1.8 ft/sec, indicative of risk for recurrent falls (1.14 ft/sec) General Gait Details: Pt prefers to amb at decreased speed. Pt complains of pain on R heel and educated on wearing shoes to increase support.    Stairs Stairs: Yes Stairs assistance: Min guard Stair Management: One rail Left;Step to pattern;Sideways Number of Stairs: 9 General stair comments: Pt went up 9 stairs using step-to pattern leading with R LE and coming down stairs with L LE. Pt used bilateral UE of railing and min guard for pt safety.      Balance  Overall balance assessment: Needs assistance Sitting-balance support: No upper extremity supported;Feet supported Sitting balance-Leahy Scale: Good Sitting balance - Comments: Able to maintain balance without UE support when donning gown.   Standing balance support: Bilateral upper extremity supported;During functional activity Standing balance-Leahy Scale: Poor Standing balance comment: Pt amb with bilateral UE support of RW.                    Cognition Arousal/Alertness: Awake/alert Behavior During Therapy: WFL for tasks assessed/performed Overall Cognitive Status: Within Functional Limits for tasks assessed     06/03/14 1206  General Comments  General comments (skin integrity, edema, etc.) Updated goals today.       Exercises General Exercises - Lower Extremity (Given handout) Ankle Circles/Pumps: AROM;Both;20 reps;Seated Long Arc Quad: AROM;Strengthening;Both;10 reps;Seated Hip ABduction/ADduction: AROM;Strengthening;10 reps;Seated        Pertinent Vitals/Pain Pain Assessment: 0-10 Pain Score: 7  (7 before treatment 5 after-states better with movement) Pain Location: Right hip to above knee Pain Descriptors / Indicators: Dull Pain Intervention(s): Repositioned           PT Goals (current goals can now be found in the care plan section) Acute Rehab PT Goals Patient Stated Goal: to go to [facility] PT Goal Formulation: With patient Time For Goal Achievement: 06/17/14 Potential to Achieve Goals: Good Progress towards PT goals: Progressing toward goals    Frequency  Min 5X/week    PT Plan Current plan remains appropriate       End of Session Equipment Utilized During Treatment: Gait belt Activity Tolerance: Patient limited by pain;Patient tolerated treatment well Patient left: in chair;with call bell/phone within reach  Time: 5638-9373 PT Time Calculation (min): 20 min  Charges:  $Gait Training: 8-22 mins                    G Codes:       Eber Jones, [place] [phone]

## 2014-06-03 NOTE — Progress Notes (Signed)
Read, reviewed, edited and agree with student's findings and recommendations.  Kellan Boehlke B. Enrique Manganaro, PT, DPT #[id number]  

## 2014-06-03 NOTE — Clinical Social Work Note (Signed)
CSW confirmed with [facility] (Admissions Director as Admission's Coordinator, Ivin Booty was in court and unable to be accessed by phone) that pt will be admitted today to SNF.  Pt will be transported by PT[facility] per pt husband Natale Milch.  CSW informed husband of wife's transportation/dc today to SNF.  Pt husband agreeable.  RN aware.    Nonnie Done, [place] [phone]  Clinical Social Work

## 2014-06-03 NOTE — Progress Notes (Signed)
Pt picked up by PTAR to disposition. Pt transported off unit via stretcher with belongings to side; pt pain med and anxiety medication given to pt as requested prior to discharged. Francis Gaines Alara Daniel RN.

## 2014-06-03 NOTE — Progress Notes (Signed)
Pt A&O x4; pt discharge education and instructions completed with pt. Pt IV removed; report called off to [employer] in [place] home. Pt dressed up and ready for discharge to SNF. Pt await on PT[facility] to transport her off to disposition. Will continue to monitor pt quietly whiles waiting. Francis Gaines Breean Nannini RN.

## 2014-06-03 NOTE — Plan of Care (Signed)
Problem: Acute Rehab PT Goals(only PT should resolve) Goal: Pt Will Ambulate 1.8

## 2014-06-08 ENCOUNTER — Other Ambulatory Visit: Payer: Self-pay | Admitting: Neurosurgery

## 2014-06-08 DIAGNOSIS — G988 Other disorders of nervous system: Secondary | ICD-10-CM

## 2014-06-16 ENCOUNTER — Ambulatory Visit
Admission: RE | Admit: 2014-06-16 | Discharge: 2014-06-16 | Disposition: A | Discharge status: Home / Self Care | Payer: Medicare Other | Source: Ambulatory Visit | Attending: Neurosurgery | Admitting: Neurosurgery

## 2014-06-16 DIAGNOSIS — G988 Other disorders of nervous system: Secondary | ICD-10-CM

## 2014-06-16 MED ORDER — GADOBENATE DIMEGLUMINE 529 MG/ML IV SOLN
13.0000 mL | Freq: Once | INTRAVENOUS | Status: AC | PRN
  Administered 2014-06-16: 13 mL via INTRAVENOUS

## 2014-07-08 ENCOUNTER — Other Ambulatory Visit: Payer: Self-pay | Admitting: Neurosurgery

## 2014-07-08 ENCOUNTER — Encounter (HOSPITAL_COMMUNITY): Payer: Self-pay | Admitting: *Deleted

## 2014-07-08 ENCOUNTER — Encounter (HOSPITAL_COMMUNITY): Payer: Self-pay | Admitting: Pharmacy Technician

## 2014-07-09 ENCOUNTER — Inpatient Hospital Stay (HOSPITAL_COMMUNITY): Payer: Medicare Other | Admitting: Certified Registered"

## 2014-07-09 ENCOUNTER — Encounter (HOSPITAL_COMMUNITY): Payer: Medicare Other | Admitting: Certified Registered"

## 2014-07-09 ENCOUNTER — Encounter (HOSPITAL_COMMUNITY): Payer: Self-pay | Admitting: *Deleted

## 2014-07-09 ENCOUNTER — Inpatient Hospital Stay (HOSPITAL_COMMUNITY)
Admission: RE | Admit: 2014-07-09 | Discharge: 2014-07-14 | DRG: 027 | Disposition: A | Discharge status: Home / Self Care | Payer: Medicare Other | Source: Ambulatory Visit | Attending: Neurosurgery | Admitting: Neurosurgery

## 2014-07-09 ENCOUNTER — Encounter (HOSPITAL_COMMUNITY)
Admission: RE | Disposition: A | Discharge status: Home / Self Care | Payer: Self-pay | Source: Ambulatory Visit | Attending: Neurosurgery

## 2014-07-09 ENCOUNTER — Inpatient Hospital Stay (HOSPITAL_COMMUNITY): Payer: Medicare Other

## 2014-07-09 DIAGNOSIS — IMO0002 Reserved for concepts with insufficient information to code with codable children: Secondary | ICD-10-CM | POA: Diagnosis present

## 2014-07-09 DIAGNOSIS — Z87891 Personal history of nicotine dependence: Secondary | ICD-10-CM

## 2014-07-09 DIAGNOSIS — G9782 Other postprocedural complications and disorders of nervous system: Secondary | ICD-10-CM | POA: Diagnosis present

## 2014-07-09 DIAGNOSIS — G9600 Cerebrospinal fluid leak, unspecified: Secondary | ICD-10-CM | POA: Diagnosis present

## 2014-07-09 DIAGNOSIS — G988 Other disorders of nervous system: Secondary | ICD-10-CM | POA: Diagnosis present

## 2014-07-09 DIAGNOSIS — K5900 Constipation, unspecified: Secondary | ICD-10-CM | POA: Diagnosis not present

## 2014-07-09 DIAGNOSIS — M129 Arthropathy, unspecified: Secondary | ICD-10-CM | POA: Diagnosis present

## 2014-07-09 DIAGNOSIS — K219 Gastro-esophageal reflux disease without esophagitis: Secondary | ICD-10-CM | POA: Diagnosis present

## 2014-07-09 DIAGNOSIS — I10 Essential (primary) hypertension: Secondary | ICD-10-CM | POA: Diagnosis present

## 2014-07-09 DIAGNOSIS — F411 Generalized anxiety disorder: Secondary | ICD-10-CM | POA: Diagnosis present

## 2014-07-09 HISTORY — PX: LUMBAR WOUND DEBRIDEMENT: SHX1988

## 2014-07-09 HISTORY — DX: Family history of other specified conditions: Z84.89

## 2014-07-09 HISTORY — DX: Personal history of urinary calculi: Z87.442

## 2014-07-09 HISTORY — DX: Constipation, unspecified: K59.00

## 2014-07-09 LAB — CBC
HCT: 38.3 % (ref 36.0–46.0)
Hemoglobin: 12.5 g/dL (ref 12.0–15.0)
MCH: 29.4 pg (ref 26.0–34.0)
MCHC: 32.6 g/dL (ref 30.0–36.0)
MCV: 90.1 fL (ref 78.0–100.0)
Platelets: 428 10*3/uL — ABNORMAL HIGH (ref 150–400)
RBC: 4.25 MIL/uL (ref 3.87–5.11)
RDW: 12.6 % (ref 11.5–15.5)
WBC: 12.4 10*3/uL — ABNORMAL HIGH (ref 4.0–10.5)

## 2014-07-09 LAB — BASIC METABOLIC PANEL
Anion gap: 14 (ref 5–15)
BUN: 10 mg/dL (ref 6–23)
CO2: 26 mEq/L (ref 19–32)
Calcium: 9.4 mg/dL (ref 8.4–10.5)
Chloride: 99 mEq/L (ref 96–112)
Creatinine, Ser: 0.54 mg/dL (ref 0.50–1.10)
GFR calc Af Amer: >90 mL/min (ref >90)
GFR calc non Af Amer: >90 mL/min (ref >90)
Glucose, Bld: 101 mg/dL — ABNORMAL HIGH (ref 70–99)
Potassium: 5.2 mEq/L (ref 3.7–5.3)
Sodium: 139 mEq/L (ref 137–147)

## 2014-07-09 LAB — SURGICAL PCR SCREEN
MRSA, PCR: NEGATIVE
Staphylococcus aureus: NEGATIVE

## 2014-07-09 SURGERY — LUMBAR WOUND DEBRIDEMENT
Anesthesia: General

## 2014-07-09 MED ORDER — SODIUM CHLORIDE 0.9 % IV SOLN: 250.0000 mL | INTRAVENOUS | Status: DC

## 2014-07-09 MED ORDER — MENTHOL 3 MG MT LOZG
1.0000 | LOZENGE | OROMUCOSAL | Status: DC | PRN
  Filled 2014-07-09: qty 9

## 2014-07-09 MED ORDER — EPHEDRINE SULFATE 50 MG/ML IJ SOLN
INTRAMUSCULAR | Status: AC
  Filled 2014-07-09: qty 1

## 2014-07-09 MED ORDER — ONDANSETRON HCL 4 MG/2ML IJ SOLN: 4.0000 mg | INTRAMUSCULAR | Status: DC | PRN

## 2014-07-09 MED ORDER — SODIUM CHLORIDE 0.9 % IJ SOLN
3.0000 mL | Freq: Two times a day (BID) | INTRAMUSCULAR | Status: DC
  Administered 2014-07-10 – 2014-07-12 (×3): 3 mL via INTRAVENOUS

## 2014-07-09 MED ORDER — MIDAZOLAM HCL 2 MG/2ML IJ SOLN
INTRAMUSCULAR | Status: AC
  Filled 2014-07-09: qty 2

## 2014-07-09 MED ORDER — HEMOSTATIC AGENTS (NO CHARGE) OPTIME
TOPICAL | Status: DC | PRN
  Administered 2014-07-09: 1 via TOPICAL

## 2014-07-09 MED ORDER — DIAZEPAM 5 MG PO TABS
5.0000 mg | ORAL_TABLET | Freq: Four times a day (QID) | ORAL | Status: DC | PRN
  Administered 2014-07-13: 5 mg via ORAL
  Filled 2014-07-09: qty 1

## 2014-07-09 MED ORDER — BUPIVACAINE HCL (PF) 0.5 % IJ SOLN
INTRAMUSCULAR | Status: DC | PRN
  Administered 2014-07-09: 20 mL

## 2014-07-09 MED ORDER — ACETAMINOPHEN 650 MG RE SUPP: 650.0000 mg | RECTAL | Status: DC | PRN

## 2014-07-09 MED ORDER — POTASSIUM CHLORIDE IN NACL 20-0.9 MEQ/L-% IV SOLN
INTRAVENOUS | Status: DC
  Administered 2014-07-09 – 2014-07-12 (×3): via INTRAVENOUS
  Filled 2014-07-09 (×12): qty 1000

## 2014-07-09 MED ORDER — DEXAMETHASONE SODIUM PHOSPHATE 10 MG/ML IJ SOLN
INTRAMUSCULAR | Status: DC | PRN
  Administered 2014-07-09: 4 mg via INTRAVENOUS

## 2014-07-09 MED ORDER — LACTATED RINGERS IV SOLN
INTRAVENOUS | Status: DC
  Administered 2014-07-09 (×2): via INTRAVENOUS

## 2014-07-09 MED ORDER — GLYCOPYRROLATE 0.2 MG/ML IJ SOLN
INTRAMUSCULAR | Status: DC | PRN
  Administered 2014-07-09: 0.4 mg via INTRAVENOUS

## 2014-07-09 MED ORDER — NEOSTIGMINE METHYLSULFATE 10 MG/10ML IV SOLN
INTRAVENOUS | Status: AC
  Filled 2014-07-09: qty 1

## 2014-07-09 MED ORDER — 0.9 % SODIUM CHLORIDE (POUR BTL) OPTIME
TOPICAL | Status: DC | PRN
  Administered 2014-07-09: 1000 mL

## 2014-07-09 MED ORDER — PHENOL 1.4 % MT LIQD: 1.0000 | OROMUCOSAL | Status: DC | PRN

## 2014-07-09 MED ORDER — ONDANSETRON HCL 4 MG/2ML IJ SOLN
INTRAMUSCULAR | Status: AC
  Filled 2014-07-09: qty 2

## 2014-07-09 MED ORDER — HYDROCODONE-ACETAMINOPHEN 5-325 MG PO TABS: 1.0000 | ORAL_TABLET | ORAL | Status: DC | PRN

## 2014-07-09 MED ORDER — SODIUM CHLORIDE 0.9 % IJ SOLN: 3.0000 mL | INTRAMUSCULAR | Status: DC | PRN

## 2014-07-09 MED ORDER — OXYCODONE HCL 5 MG/5ML PO SOLN: 5.0000 mg | Freq: Once | ORAL | Status: DC | PRN

## 2014-07-09 MED ORDER — DICYCLOMINE HCL 10 MG PO CAPS
10.0000 mg | ORAL_CAPSULE | Freq: Two times a day (BID) | ORAL | Status: DC
  Administered 2014-07-09 – 2014-07-14 (×10): 10 mg via ORAL
  Filled 2014-07-09 (×11): qty 1

## 2014-07-09 MED ORDER — OXYCODONE-ACETAMINOPHEN 5-325 MG PO TABS
1.0000 | ORAL_TABLET | ORAL | Status: DC | PRN
Start: 2014-07-09 — End: 2014-07-14
  Administered 2014-07-12 – 2014-07-13 (×4): 2 via ORAL
  Filled 2014-07-09 (×4): qty 2

## 2014-07-09 MED ORDER — PROPOFOL 10 MG/ML IV BOLUS
INTRAVENOUS | Status: DC | PRN
  Administered 2014-07-09: 150 mg via INTRAVENOUS

## 2014-07-09 MED ORDER — LIDOCAINE HCL (CARDIAC) 20 MG/ML IV SOLN
INTRAVENOUS | Status: DC | PRN
  Administered 2014-07-09: 80 mg via INTRAVENOUS

## 2014-07-09 MED ORDER — ROCURONIUM BROMIDE 50 MG/5ML IV SOLN
INTRAVENOUS | Status: AC
  Filled 2014-07-09: qty 1

## 2014-07-09 MED ORDER — NEOSTIGMINE METHYLSULFATE 10 MG/10ML IV SOLN
INTRAVENOUS | Status: DC | PRN
  Administered 2014-07-09: 3 mg via INTRAVENOUS

## 2014-07-09 MED ORDER — SODIUM CHLORIDE 0.9 % IJ SOLN
INTRAMUSCULAR | Status: AC
  Filled 2014-07-09: qty 10

## 2014-07-09 MED ORDER — MUPIROCIN 2 % EX OINT
TOPICAL_OINTMENT | CUTANEOUS | Status: AC
  Filled 2014-07-09: qty 22

## 2014-07-09 MED ORDER — ROCURONIUM BROMIDE 100 MG/10ML IV SOLN
INTRAVENOUS | Status: DC | PRN
  Administered 2014-07-09: 40 mg via INTRAVENOUS

## 2014-07-09 MED ORDER — ARTIFICIAL TEARS OP OINT
TOPICAL_OINTMENT | OPHTHALMIC | Status: AC
  Filled 2014-07-09: qty 3.5

## 2014-07-09 MED ORDER — GLYCOPYRROLATE 0.2 MG/ML IJ SOLN
INTRAMUSCULAR | Status: AC
  Filled 2014-07-09: qty 2

## 2014-07-09 MED ORDER — PROPOFOL 10 MG/ML IV BOLUS
INTRAVENOUS | Status: AC
  Filled 2014-07-09: qty 20

## 2014-07-09 MED ORDER — LIDOCAINE HCL (CARDIAC) 20 MG/ML IV SOLN
INTRAVENOUS | Status: AC
  Filled 2014-07-09: qty 5

## 2014-07-09 MED ORDER — ISOSORBIDE DINITRATE 10 MG PO TABS
10.0000 mg | ORAL_TABLET | Freq: Two times a day (BID) | ORAL | Status: DC
  Administered 2014-07-09 – 2014-07-14 (×10): 10 mg via ORAL
  Filled 2014-07-09 (×12): qty 1

## 2014-07-09 MED ORDER — MORPHINE SULFATE 2 MG/ML IJ SOLN
1.0000 mg | INTRAMUSCULAR | Status: DC | PRN
  Administered 2014-07-09: 2 mg via INTRAVENOUS
  Filled 2014-07-09: qty 1

## 2014-07-09 MED ORDER — CEFAZOLIN SODIUM-DEXTROSE 2-3 GM-% IV SOLR
2.0000 g | Freq: Once | INTRAVENOUS | Status: AC
  Administered 2014-07-09: 2 g via INTRAVENOUS

## 2014-07-09 MED ORDER — FENTANYL CITRATE 0.05 MG/ML IJ SOLN
INTRAMUSCULAR | Status: DC | PRN
  Administered 2014-07-09: 50 ug via INTRAVENOUS
  Administered 2014-07-09: 100 ug via INTRAVENOUS
  Administered 2014-07-09: 50 ug via INTRAVENOUS

## 2014-07-09 MED ORDER — ACETAMINOPHEN 325 MG PO TABS
650.0000 mg | ORAL_TABLET | ORAL | Status: DC | PRN
  Administered 2014-07-10 – 2014-07-14 (×5): 650 mg via ORAL
  Filled 2014-07-09 (×4): qty 2

## 2014-07-09 MED ORDER — CEFAZOLIN SODIUM-DEXTROSE 2-3 GM-% IV SOLR
INTRAVENOUS | Status: AC
  Filled 2014-07-09: qty 50

## 2014-07-09 MED ORDER — FENTANYL CITRATE 0.05 MG/ML IJ SOLN: 25.0000 ug | INTRAMUSCULAR | Status: DC | PRN

## 2014-07-09 MED ORDER — ONDANSETRON HCL 4 MG/2ML IJ SOLN
INTRAMUSCULAR | Status: DC | PRN
  Administered 2014-07-09: 4 mg via INTRAVENOUS

## 2014-07-09 MED ORDER — AMLODIPINE BESYLATE 5 MG PO TABS
5.0000 mg | ORAL_TABLET | Freq: Every day | ORAL | Status: DC
  Administered 2014-07-09 – 2014-07-14 (×6): 5 mg via ORAL
  Filled 2014-07-09 (×6): qty 1

## 2014-07-09 MED ORDER — FENTANYL CITRATE 0.05 MG/ML IJ SOLN
INTRAMUSCULAR | Status: AC
  Filled 2014-07-09: qty 5

## 2014-07-09 MED ORDER — POLYETHYLENE GLYCOL 3350 17 G PO PACK
17.0000 g | PACK | Freq: Every day | ORAL | Status: DC | PRN
  Filled 2014-07-09: qty 1

## 2014-07-09 MED ORDER — MIDAZOLAM HCL 5 MG/5ML IJ SOLN
INTRAMUSCULAR | Status: DC | PRN
  Administered 2014-07-09: 1 mg via INTRAVENOUS

## 2014-07-09 MED ORDER — SENNA 8.6 MG PO TABS
1.0000 | ORAL_TABLET | Freq: Two times a day (BID) | ORAL | Status: DC
Start: 2014-07-09 — End: 2014-07-14
  Administered 2014-07-09 – 2014-07-14 (×6): 8.6 mg via ORAL
  Filled 2014-07-09 (×11): qty 1

## 2014-07-09 MED ORDER — PANTOPRAZOLE SODIUM 40 MG PO TBEC
40.0000 mg | DELAYED_RELEASE_TABLET | Freq: Every day | ORAL | Status: DC
Start: 2014-07-09 — End: 2014-07-14
  Administered 2014-07-09 – 2014-07-14 (×6): 40 mg via ORAL
  Filled 2014-07-09 (×6): qty 1

## 2014-07-09 MED ORDER — CINNAMON 500 MG PO CAPS: 2000.0000 mg | ORAL_CAPSULE | Freq: Every day | ORAL | Status: DC

## 2014-07-09 MED ORDER — POLYETHYLENE GLYCOL 3350 17 G PO PACK
17.0000 g | PACK | Freq: Every day | ORAL | Status: DC | PRN
  Administered 2014-07-10: 17 g via ORAL

## 2014-07-09 MED ORDER — DEXAMETHASONE SODIUM PHOSPHATE 4 MG/ML IJ SOLN
INTRAMUSCULAR | Status: AC
  Filled 2014-07-09: qty 1

## 2014-07-09 MED ORDER — KETOROLAC TROMETHAMINE 30 MG/ML IJ SOLN
30.0000 mg | Freq: Four times a day (QID) | INTRAMUSCULAR | Status: DC
  Administered 2014-07-10 – 2014-07-12 (×8): 30 mg via INTRAVENOUS
  Filled 2014-07-09 (×21): qty 1

## 2014-07-09 MED ORDER — ONDANSETRON HCL 4 MG/2ML IJ SOLN: 4.0000 mg | Freq: Four times a day (QID) | INTRAMUSCULAR | Status: DC | PRN

## 2014-07-09 MED ORDER — OXYCODONE HCL 5 MG PO TABS: 5.0000 mg | ORAL_TABLET | Freq: Once | ORAL | Status: DC | PRN

## 2014-07-09 MED ORDER — MUPIROCIN 2 % EX OINT
1.0000 "application " | TOPICAL_OINTMENT | Freq: Once | CUTANEOUS | Status: AC
  Administered 2014-07-09: 1 via TOPICAL

## 2014-07-09 MED ORDER — THROMBIN 5000 UNITS EX SOLR
CUTANEOUS | Status: DC | PRN
  Administered 2014-07-09 (×2): 5000 [IU] via TOPICAL

## 2014-07-09 MED ORDER — CLORAZEPATE DIPOTASSIUM 3.75 MG PO TABS
7.5000 mg | ORAL_TABLET | Freq: Two times a day (BID) | ORAL | Status: DC | PRN
  Administered 2014-07-11 – 2014-07-13 (×2): 7.5 mg via ORAL
  Filled 2014-07-09 (×2): qty 2

## 2014-07-09 MED ORDER — ARTIFICIAL TEARS OP OINT
TOPICAL_OINTMENT | OPHTHALMIC | Status: DC | PRN
  Administered 2014-07-09: 1 via OPHTHALMIC

## 2014-07-09 SURGICAL SUPPLY — 71 items
APL SKNCLS STERI-STRIP NONHPOA (GAUZE/BANDAGES/DRESSINGS)
APL SRG 60D 8 XTD TIP BNDBL (TIP) ×1
BAG DECANTER FOR FLEXI CONT (MISCELLANEOUS) ×3 IMPLANT
BENZOIN TINCTURE PRP APPL 2/3 (GAUZE/BANDAGES/DRESSINGS) IMPLANT
BLADE SURG ROTATE 9660 (MISCELLANEOUS) IMPLANT
BUR MATCHSTICK NEURO 3.0 LAGG (BURR) ×2 IMPLANT
CANISTER SUCT 3000ML (MISCELLANEOUS) ×3 IMPLANT
CLOSURE WOUND 1/2 X4 (GAUZE/BANDAGES/DRESSINGS)
CONT SPEC 4OZ CLIKSEAL STRL BL (MISCELLANEOUS) ×3 IMPLANT
DECANTER SPIKE VIAL GLASS SM (MISCELLANEOUS) ×3 IMPLANT
DRAPE LAPAROTOMY 100X72X124 (DRAPES) ×3 IMPLANT
DRAPE POUCH INSTRU U-SHP 10X18 (DRAPES) ×3 IMPLANT
DRAPE SURG 17X23 STRL (DRAPES) ×3 IMPLANT
DRSG OPSITE POSTOP 4X8 (GAUZE/BANDAGES/DRESSINGS) ×2 IMPLANT
DURAPREP 26ML APPLICATOR (WOUND CARE) ×3 IMPLANT
DURASEAL APPLICATOR TIP (TIP) ×2 IMPLANT
DURASEAL SPINE SEALANT 3ML (MISCELLANEOUS) ×2 IMPLANT
ELECT REM PT RETURN 9FT ADLT (ELECTROSURGICAL) ×3
ELECTRODE REM PT RTRN 9FT ADLT (ELECTROSURGICAL) ×1 IMPLANT
GAUZE SPONGE 4X4 12PLY STRL (GAUZE/BANDAGES/DRESSINGS) IMPLANT
GAUZE SPONGE 4X4 16PLY XRAY LF (GAUZE/BANDAGES/DRESSINGS) ×2 IMPLANT
GLOVE BIO SURGEON STRL SZ 6.5 (GLOVE) IMPLANT
GLOVE BIO SURGEON STRL SZ7 (GLOVE) IMPLANT
GLOVE BIO SURGEON STRL SZ7.5 (GLOVE) IMPLANT
GLOVE BIO SURGEON STRL SZ8 (GLOVE) IMPLANT
GLOVE BIO SURGEON STRL SZ8.5 (GLOVE) IMPLANT
GLOVE BIO SURGEONS STRL SZ 6.5 (GLOVE)
GLOVE BIOGEL M 8.0 STRL (GLOVE) IMPLANT
GLOVE ECLIPSE 6.5 STRL STRAW (GLOVE) ×3 IMPLANT
GLOVE ECLIPSE 7.0 STRL STRAW (GLOVE) IMPLANT
GLOVE ECLIPSE 7.5 STRL STRAW (GLOVE) IMPLANT
GLOVE ECLIPSE 8.0 STRL XLNG CF (GLOVE) IMPLANT
GLOVE ECLIPSE 8.5 STRL (GLOVE) IMPLANT
GLOVE EXAM NITRILE LRG STRL (GLOVE) IMPLANT
GLOVE EXAM NITRILE MD LF STRL (GLOVE) IMPLANT
GLOVE EXAM NITRILE XL STR (GLOVE) IMPLANT
GLOVE EXAM NITRILE XS STR PU (GLOVE) IMPLANT
GLOVE INDICATOR 6.5 STRL GRN (GLOVE) IMPLANT
GLOVE INDICATOR 7.0 STRL GRN (GLOVE) IMPLANT
GLOVE INDICATOR 7.5 STRL GRN (GLOVE) IMPLANT
GLOVE INDICATOR 8.0 STRL GRN (GLOVE) IMPLANT
GLOVE INDICATOR 8.5 STRL (GLOVE) IMPLANT
GLOVE OPTIFIT SS 8.0 STRL (GLOVE) IMPLANT
GLOVE SURG SS PI 6.5 STRL IVOR (GLOVE) IMPLANT
GOWN STRL REUS W/ TWL LRG LVL3 (GOWN DISPOSABLE) ×2 IMPLANT
GOWN STRL REUS W/ TWL XL LVL3 (GOWN DISPOSABLE) IMPLANT
GOWN STRL REUS W/TWL 2XL LVL3 (GOWN DISPOSABLE) IMPLANT
GOWN STRL REUS W/TWL LRG LVL3 (GOWN DISPOSABLE) ×6
GOWN STRL REUS W/TWL XL LVL3 (GOWN DISPOSABLE)
GRAFT DURAGEN MATRIX 2WX2L ×2 IMPLANT
KIT BASIN OR (CUSTOM PROCEDURE TRAY) ×3 IMPLANT
KIT ROOM TURNOVER OR (KITS) ×3 IMPLANT
NDL HYPO 25X1 1.5 SAFETY (NEEDLE) IMPLANT
NEEDLE HYPO 25X1 1.5 SAFETY (NEEDLE) ×3 IMPLANT
NS IRRIG 1000ML POUR BTL (IV SOLUTION) ×3 IMPLANT
PACK LAMINECTOMY NEURO (CUSTOM PROCEDURE TRAY) ×3 IMPLANT
PAD ARMBOARD 7.5X6 YLW CONV (MISCELLANEOUS) ×9 IMPLANT
SPONGE LAP 4X18 X RAY DECT (DISPOSABLE) IMPLANT
SPONGE SURGIFOAM ABS GEL SZ50 (HEMOSTASIS) ×3 IMPLANT
STRIP CLOSURE SKIN 1/2X4 (GAUZE/BANDAGES/DRESSINGS) IMPLANT
SUT ETHILON 3 0 FSL (SUTURE) ×4 IMPLANT
SUT VIC AB 0 CT1 18XCR BRD8 (SUTURE) ×1 IMPLANT
SUT VIC AB 0 CT1 8-18 (SUTURE) ×12
SUT VIC AB 2-0 CT1 18 (SUTURE) ×5 IMPLANT
SUT VIC AB 3-0 SH 8-18 (SUTURE) ×3 IMPLANT
SWAB CULTURE LIQ STUART DBL (MISCELLANEOUS) IMPLANT
SYR 20ML ECCENTRIC (SYRINGE) ×3 IMPLANT
TOWEL OR 17X24 6PK STRL BLUE (TOWEL DISPOSABLE) ×3 IMPLANT
TOWEL OR 17X26 10 PK STRL BLUE (TOWEL DISPOSABLE) ×3 IMPLANT
TUBE ANAEROBIC SPECIMEN COL (MISCELLANEOUS) IMPLANT
WATER STERILE IRR 1000ML POUR (IV SOLUTION) ×3 IMPLANT

## 2014-07-09 NOTE — Anesthesia Preprocedure Evaluation (Addendum)
Anesthesia Evaluation  Patient identified by MRN, date of birth, ID band Patient awake    Reviewed: Allergy & Precautions, H&P , NPO status , Patient's Chart, lab work & pertinent test results  Airway Mallampati: II TM Distance: <3 FB Neck ROM: full    Dental  (+) Teeth Intact, Dental Advisory Given   Pulmonary former smoker,          Cardiovascular hypertension, Rhythm:Regular     Neuro/Psych Anxiety    GI/Hepatic GERD-  ,  Endo/Other    Renal/GU      Musculoskeletal  (+) Arthritis -,   Abdominal   Peds  Hematology   Anesthesia Other Findings   Reproductive/Obstetrics                          Anesthesia Physical Anesthesia Plan  ASA: II  Anesthesia Plan: General   Post-op Pain Management:    Induction: Intravenous  Airway Management Planned: Oral ETT  Additional Equipment:   Intra-op Plan:   Post-operative Plan: Extubation in OR  Informed Consent: I have reviewed the patients History and Physical, chart, labs and discussed the procedure including the risks, benefits and alternatives for the proposed anesthesia with the patient or authorized representative who has indicated his/her understanding and acceptance.     Plan Discussed with: CRNA, Anesthesiologist and Surgeon  Anesthesia Plan Comments:         Anesthesia Quick Evaluation

## 2014-07-09 NOTE — Anesthesia Procedure Notes (Signed)
Procedure Name: Intubation Date/Time: 07/09/2014 5:49 PM Performed by: Barrington Ellison Pre-anesthesia Checklist: Patient identified, Emergency Drugs available, Suction available, Patient being monitored and Timeout performed Patient Re-evaluated:Patient Re-evaluated prior to inductionOxygen Delivery Method: Circle system utilized Preoxygenation: Pre-oxygenation with 100% oxygen Intubation Type: IV induction Ventilation: Mask ventilation without difficulty Laryngoscope Size: Mac and 3 Grade View: Grade I Tube size: 7.0 mm Number of attempts: 1 Airway Equipment and Method: Stylet Placement Confirmation: ETT inserted through vocal cords under direct vision,  positive ETCO2 and breath sounds checked- equal and bilateral Secured at: 21 cm Tube secured with: Tape Dental Injury: Teeth and Oropharynx as per pre-operative assessment

## 2014-07-09 NOTE — H&P (Signed)
BP 157/66  Pulse 89  Temp(Src) 97.9 F (36.6 C) (Oral)  Resp 20  Ht 5\' 2"  (1.575 m)  Wt 66.225 kg (146 lb)  BMI 26.70 kg/m2  SpO2 99% Cc:CSF leak, psuedomeningocele Victoria Molina underwent a lumbar fusion ~1 month ago. During the case there was a durotomy, which I attempted to repair primarily. Victoria Molina has not had any fluid leak from the wound, but has complained of headaches since the operation. Victoria Molina has no neurologic dysfunction. I did give her the option of doing nothing since there is no risk of infection, but Victoria Molina states the headaches are not something Victoria Molina can tolerate.  Allergies  Allergen Reactions  . Iodinated Diagnostic Agents Hives and Itching    Pt pre-meds with Benadryl 50mg  PO; tolerates ESI's.  jkl  . Sulfa Antibiotics Hives and Itching  . Vioxx [Rofecoxib] Swelling    Swelling of legs and feet   Past Medical History  Diagnosis Date  . HTN (hypertension)   . GERD (gastroesophageal reflux disease)   . Colon polyps 2006, 2011    HYPERPLASTIC  . H pylori ulcer 2002,2006    AF THEN ABO, bX NEG 2011/2013  . Family history of anesthesia complication     Neice- nauseous  . Anxiety   . Constipation   . History of kidney stones   . Arthritis   . Claustrophobia    Past Surgical History  Procedure Laterality Date  . Upper gastrointestinal endoscopy    . Total abdominal hysterectomy    . Back surgery    . Colonoscopy  2006, 2011    HYPERPLASTIC POLYPS  . Appendectomy    . Esophagogastroduodenoscopy N/A 12/25/2013    Dr. Maryclare Bean ESOPHAGUS/Small hiatal hernia/MILD gastritis, normal small bowel biopsies   Family History  Problem Relation Age of Onset  . Colon cancer Neg Hx   . Colon polyps Neg Hx    History  Substance Use Topics  . Smoking status: Former Smoker    Types: Cigarettes    Quit date: 05/09/1987  . Smokeless tobacco: Not on file     Comment: Quit x 25 years ago  . Alcohol Use: No   Prior to Admission medications   Medication Sig Start Date End Date  Taking? Authorizing Provider  amLODipine (NORVASC) 5 MG tablet Take 5 mg by mouth daily.   Yes Historical Provider, MD  clorazepate (TRANXENE) 7.5 MG tablet Take 7.5 mg by mouth 2 (two) times daily as needed for anxiety.   Yes Historical Provider, MD  dicyclomine (BENTYL) 10 MG capsule Take 10 mg by mouth 2 (two) times daily.    Yes Historical Provider, MD  isosorbide dinitrate (ISORDIL) 10 MG tablet Take 10 mg by mouth 2 (two) times daily.  04/11/12  Yes Historical Provider, MD  omeprazole (PRILOSEC) 20 MG capsule Take 20 mg by mouth daily.  11/24/13  Yes Historical Provider, MD  polyethylene glycol (MIRALAX / GLYCOLAX) packet Take 17 g by mouth daily as needed (constipation).    Yes Historical Provider, MD  CINNAMON PO Take 2,000 mg by mouth daily.    Historical Provider, MD   Physical Exam  Constitutional: Victoria Molina is oriented to person, place, and time. Victoria Molina appears well-developed and well-nourished.  HENT:  Head: Normocephalic and atraumatic.  Eyes: EOM are normal. Pupils are equal, round, and reactive to light.  Neck: Normal range of motion. Neck supple.  Cardiovascular: Normal rate, regular rhythm and normal heart sounds.   Pulmonary/Chest: Effort normal and breath sounds normal.  Abdominal:  Soft. Bowel sounds are normal.  Musculoskeletal: Normal range of motion.  Neurological: Victoria Molina is alert and oriented to person, place, and time. Victoria Molina has normal reflexes. Victoria Molina displays normal reflexes. No cranial nerve deficit. Victoria Molina exhibits normal muscle tone. Coordination normal.  Skin: Skin is warm and dry.  No results found.  Will proceed with csf leak repair in the lumbar postoperative site. Risks including bleeding, infection, need for further surgery, continued leak, nerve damage, weakness in one or both lower extremities, bowel and or bladder dysfunction, and other risks. Victoria Molina understands and wishes to proceed.

## 2014-07-10 MED ORDER — ENSURE PUDDING PO PUDG
1.0000 | Freq: Two times a day (BID) | ORAL | Status: DC
  Administered 2014-07-10 – 2014-07-11 (×2): 1 via ORAL

## 2014-07-10 MED ORDER — PNEUMOCOCCAL VAC POLYVALENT 25 MCG/0.5ML IJ INJ
0.5000 mL | INJECTION | INTRAMUSCULAR | Status: DC
  Filled 2014-07-10: qty 0.5

## 2014-07-10 MED ORDER — ENSURE COMPLETE PO LIQD
237.0000 mL | Freq: Two times a day (BID) | ORAL | Status: DC
  Administered 2014-07-11: 237 mL via ORAL

## 2014-07-10 NOTE — Progress Notes (Signed)
INITIAL NUTRITION ASSESSMENT  DOCUMENTATION CODES Per approved criteria  -Not Applicable   INTERVENTION: - Ensure Complete po BID, each supplement provides 350 kcal and 13 grams of protein - Ensure Pudding po BID, each supplement provides 170 kcal and 4 grams of protein  NUTRITION DIAGNOSIS: Inadequate oral intake related to nausea and vomiting as evidenced by poor po.   Goal: Pt to meet >/= 90% of their estimated nutrition needs   Monitor:  Weight trend, po intake, acceptance of supplements, labs  Reason for Assessment: MST  68 y.o. female  Admitting Dx: <principal problem not specified>  ASSESSMENT: Mrs. Ross underwent a lumbar fusion ~1 month ago. During the case there was a durotomy, which I attempted to repair primarily. She has not had any fluid leak from the wound, but has complained of headaches since the operation.  - Pt underwent CSF leak repair in the lumbar postoperative site on 9/18. - Pt reports a weight loss of ~10 lbs in the past 6 months.  - She has been unable to tolerate po this morning and afternoon. She reports that she vomited after eating and drinking. Pt believes that this is due to her lying on her back while eating.  - Pt believes that she will be able to better tolerate liquids and soft foods until she feels better. Will send Ensure supplements.  - Pt with no signs of fat or muscle wasting at this time.   Labs: Na, K and BUN WNL  Height: Ht Readings from Last 1 Encounters:  07/09/14 5\' 2"  (1.575 m)    Weight: Wt Readings from Last 1 Encounters:  07/09/14 146 lb (66.225 kg)    Ideal Body Weight: 50.1 kg  % Ideal Body Weight: 132%  Wt Readings from Last 10 Encounters:  07/09/14 146 lb (66.225 kg)  07/09/14 146 lb (66.225 kg)  03/02/14 153 lb (69.4 kg)  12/25/13 155 lb (70.308 kg)  12/25/13 155 lb (70.308 kg)  12/23/13 155 lb 3.2 oz (70.398 kg)  11/05/13 152 lb 3.2 oz (69.037 kg)  06/25/13 152 lb 6.4 oz (69.128 kg)  04/30/13 152 lb  12.8 oz (69.31 kg)  09/10/12 160 lb 12.8 oz (72.938 kg)    Usual Body Weight: 156-160 lbs  % Usual Body Weight: 94%  BMI:  Body mass index is 26.7 kg/(m^2).  Estimated Nutritional Needs: Kcal: 1750-2000 Protein: 85-100 g Fluid: 2.0 L/day  Skin: closed incision on back  Diet Order: General  EDUCATION NEEDS: -Education needs addressed   Intake/Output Summary (Last 24 hours) at 07/10/14 1439 Last data filed at 07/10/14 1251  Gross per 24 hour  Intake   1100 ml  Output   2600 ml  Net  -1500 ml    Last BM: prior to admission   Labs:   Recent Labs Lab 07/09/14 1508  NA 139  K 5.2  CL 99  CO2 26  BUN 10  CREATININE 0.54  CALCIUM 9.4  GLUCOSE 101*    CBG (last 3)  No results found for this basename: GLUCAP,  in the last 72 hours  Scheduled Meds: . amLODipine  5 mg Oral Daily  . dicyclomine  10 mg Oral BID  . isosorbide dinitrate  10 mg Oral BID  . ketorolac  30 mg Intravenous 4 times per day  . pantoprazole  40 mg Oral Daily  . [START ON 07/11/2014] pneumococcal 23 valent vaccine  0.5 mL Intramuscular Tomorrow-1000  . senna  1 tablet Oral BID  . sodium chloride  3 mL Intravenous Q12H    Continuous Infusions: . sodium chloride    . 0.9 % NaCl with KCl 20 mEq / L 80 mL/hr at 07/10/14 1242    Past Medical History  Diagnosis Date  . HTN (hypertension)   . GERD (gastroesophageal reflux disease)   . Colon polyps 2006, 2011    HYPERPLASTIC  . H pylori ulcer 2002,2006    AF THEN ABO, bX NEG 2011/2013  . Family history of anesthesia complication     Neice- nauseous  . Anxiety   . Constipation   . History of kidney stones   . Arthritis   . Claustrophobia     Past Surgical History  Procedure Laterality Date  . Upper gastrointestinal endoscopy    . Total abdominal hysterectomy    . Back surgery    . Colonoscopy  2006, 2011    HYPERPLASTIC POLYPS  . Appendectomy    . Esophagogastroduodenoscopy N/A 12/25/2013    Dr. Maryclare Bean ESOPHAGUS/Small  hiatal hernia/MILD gastritis, normal small bowel biopsies    Terrace Arabia RD, LDN

## 2014-07-10 NOTE — Progress Notes (Signed)
Patient ID: Victoria Molina, female   DOB: 08/25/1946, 68 y.o.   MRN: [id number] Stable, i found her with head up to 10 degrees. No headache. Mild inxisional pain

## 2014-07-10 NOTE — Progress Notes (Signed)
Spoke with MD on call regarding bedrest orders and continuation of foley catheter. Orders placed for HOB to be maintained at 5 degrees or less with pillow and foley catheter to be maintained due to strict bedrest.

## 2014-07-10 NOTE — Progress Notes (Signed)
PT Cancellation Note  Patient Details Name: TYREKA HENNEKE MRN: [id number] DOB: 05-Mar-1946   Cancelled Treatment:    Reason Eval/Treat Not Completed: Patient not medically ready (Pt with bedrest with HOB <5 degrees.)   Kennadee Walthour 07/10/2014, 11:17 AM

## 2014-07-11 MED ORDER — LOPERAMIDE HCL 1 MG/5ML PO LIQD
2.0000 mg | ORAL | Status: DC | PRN
  Administered 2014-07-11: 2 mg via ORAL
  Filled 2014-07-11 (×2): qty 10

## 2014-07-11 NOTE — Progress Notes (Signed)
Patient ID: Victoria Molina, female   DOB: 04/17/1946, 68 y.o.   MRN: [id number] Doing well. C/o constipation. No headache

## 2014-07-11 NOTE — Progress Notes (Signed)
PT Cancellation Note  Patient Details Name: Victoria Molina MRN: [id number] DOB: 14-Mar-1946   Cancelled Treatment:    Reason Eval/Treat Not Completed: Patient not medically ready Pt on flat bedrest.  PT to check back tomorrow.   Lorriane Shire 07/11/2014, 3:58 PM

## 2014-07-12 ENCOUNTER — Encounter (HOSPITAL_COMMUNITY): Payer: Self-pay | Admitting: Neurosurgery

## 2014-07-12 NOTE — Progress Notes (Signed)
PT Cancellation Note  Patient Details Name: Victoria Molina MRN: [id number] DOB: Sep 11, 1946   Cancelled Treatment:     pt continues to be on flat bedrest.  Please advance activity orders to allow for therapy eval.  Thanks.     Delvecchio Madole, Thornton Papas 07/12/2014, 7:37 AM

## 2014-07-12 NOTE — Evaluation (Signed)
Physical Therapy Evaluation Patient Details Name: Victoria Molina MRN: [id number] DOB: [birth year] Today's Date: 07/12/2014   History of Present Illness  Patient underwent lumbar fusion ~1 month ago, during the case there was a durotomy, which MD attempted to repair primarily.  Readmitted for CSF leak repair  Clinical Impression  Patient did well with mobility.  Patient reports decreased caregiver support at home and thus recommend SNF for discharge.  If patient has 24 supervision at home, can discharge with rolling walker.    Follow Up Recommendations SNF    Equipment Recommendations  Rolling walker with 5" wheels    Recommendations for Other Services       Precautions / Restrictions Precautions Precautions: Back      Mobility  Bed Mobility Overal bed mobility: Modified Independent             General bed mobility comments: used proper technique to roll to side and sidelying to sit  Transfers Overall transfer level: Modified independent Equipment used: Rolling walker (2 wheeled)                Ambulation/Gait Ambulation/Gait assistance: Modified independent (Device/Increase time) Ambulation Distance (Feet): 150 Feet Assistive device: Rolling walker (2 wheeled) Gait Pattern/deviations: WFL(Within Functional Limits)   Gait velocity interpretation: Below normal speed for age/gender General Gait Details: patient reports her right ankle hurt during ambulation; assessed ankle with no signs of sprain, full strength in all planes, no point tenderness.  Stairs            Wheelchair Mobility    Modified Rankin (Stroke Patients Only)       Balance Overall balance assessment: No apparent balance deficits (not formally assessed)                                           Pertinent Vitals/Pain Pain Assessment: 0-10 Pain Score: 3  Pain Location: right ankle Pain Descriptors / Indicators: Aching Pain Intervention(s): Limited activity  within patient's tolerance;Monitored during session    Home Living Family/patient expects to be discharged to:: Private residence Living Arrangements: Spouse/significant other Available Help at Discharge: Family Type of Home: House Home Access: Stairs to enter Entrance Stairs-Rails: None Technical brewer of Steps: 3 Home Layout: One level Home Equipment: None Additional Comments: plans to go SNF until able to return home    Prior Function Level of Independence: Independent               Hand Dominance        Extremity/Trunk Assessment   Upper Extremity Assessment: Overall WFL for tasks assessed           Lower Extremity Assessment: Overall WFL for tasks assessed         Communication   Communication: No difficulties  Cognition Arousal/Alertness: Awake/alert Behavior During Therapy: WFL for tasks assessed/performed Overall Cognitive Status: Within Functional Limits for tasks assessed                      General Comments      Exercises        Assessment/Plan    PT Assessment All further PT needs can be met in the next venue of care  PT Diagnosis Generalized weakness   PT Problem List Decreased activity tolerance;Pain  PT Treatment Interventions     PT Goals (Current goals can be found in the Care Plan  section) Acute Rehab PT Goals PT Goal Formulation: No goals set, d/c therapy    Frequency     Barriers to discharge        Co-evaluation               End of Session Equipment Utilized During Treatment: Gait belt Activity Tolerance: Patient tolerated treatment well Patient left: in chair;with call bell/phone within reach Nurse Communication: Mobility status         Time: 2831-5176 PT Time Calculation (min): 19 min   Charges:   PT Evaluation $Initial PT Evaluation Tier I: 1 Procedure     PT G CodesShanna Cisco, [name] 07/12/2014, 3:31 PM

## 2014-07-12 NOTE — Progress Notes (Signed)
Spoke to Dr Christella Noa concerning pts care and that she wanted to not be on bedrest anymore and felt comfortable working with PT. Dr Christella Noa gave me an order to D/C bedrest and have PT do the eval.  He also stated that I could leave Pts IV out at this time since it had infiltrated this am and Pt did not wish to be re stuck. Notified Pt of the new orders and spoke to physical therapy about seeing the Pt to perform the eval. I will continue to monitor Pts progression and Dr Christella Noa will be rounding this evening.

## 2014-07-12 NOTE — Progress Notes (Signed)
Patient ID: Victoria Molina, female   DOB: Oct 24, 1945, 68 y.o.   MRN: [id number] BP 129/67  Pulse 86  Temp(Src) 97.8 F (36.6 C) (Oral)  Resp 16  Ht 5\' 2"  (1.575 m)  Wt 66.225 kg (146 lb)  BMI 26.70 kg/m2  SpO2 99% Alert and oriented x 4 Wound is flat, dry, no signs of infection Moving lower extremities well No headaches Doing well. Cont pt

## 2014-07-12 NOTE — Transfer of Care (Addendum)
Immediate Anesthesia Transfer of Care Note  Patient: Victoria Molina  Procedure(s) Performed: Procedure(s) with comments: REPAIR OF CEREBROSPINAL FLUID LEAK (N/A) - REPAIR OF CEREBROSPINAL FLUID LEAK  Patient Location: PACU  Anesthesia Type:General  Level of Consciousness: awake  Airway & Oxygen Therapy: Patient Spontanous Breathing  Post-op Assessment: Report given to PACU RN  Post vital signs: Reviewed  Complications: No apparent anesthesia complications

## 2014-07-12 NOTE — Progress Notes (Signed)
PT Cancellation Note  Patient Details Name: BERNESTINE HOLSAPPLE MRN: [id number] DOB: [birth year]   Cancelled Treatment:    Reason Eval/Treat Not Completed: Patient not medically ready.  Pt continues to be on flat bedrest.  Will f/u another time.     Wilhemina Grall, Thornton Papas 07/12/2014, 12:27 PM

## 2014-07-13 NOTE — Anesthesia Postprocedure Evaluation (Signed)
Anesthesia Post Note  Patient: Victoria Molina  Procedure(s) Performed: Procedure(s) (LRB): REPAIR OF CEREBROSPINAL FLUID LEAK (N/A)  Anesthesia type: general  Patient location: PACU  Post pain: Pain level controlled  Post assessment: Patient's Cardiovascular Status Stable  Post vital signs: Reviewed and stable  Level of consciousness: sedated  Complications: No apparent anesthesia complications

## 2014-07-13 NOTE — Clinical Social Work Psychosocial (Signed)
Clinical Social Work Department BRIEF PSYCHOSOCIAL ASSESSMENT 07/13/2014  Patient:  Victoria Molina, Victoria Molina     Account Number:  [id number]     Admit date:  07/09/2014  Clinical Social Worker:  Wylene Men  Date/Time:  07/13/2014 12:49 PM  Referred by:  Physician  Date Referred:  07/13/2014 Referred for  SNF Placement  Psychosocial assessment   Other Referral:   none   Interview type:  Patient Other interview type:   none    PSYCHOSOCIAL DATA Living Status:  FACILITY Admitted from facility:  [facility] Level of care:  [place] Primary support name:  Bella Kennedy Primary support relationship to patient:  SPOUSE Degree of support available:   strong    CURRENT CONCERNS Current Concerns  Post-Acute Placement   Other Concerns:   none    SOCIAL WORK ASSESSMENT / PLAN CSW met with pt to discuss discharge plans.  Pt is from [facility] where she was receiving STR after surgery.  Pt was admitted to SNF in August and scheduled for this surgery on the 18th.  PT has worked with pt and recommends pt return to SNF for additional STR.  Pt is agreeable.  Pt expresses hopefulness regarding her prognosis and hopes to return home in a few short weeks. Pt reports having help at home from her husband Bella Kennedy.    CSW will continue to follow.   Assessment/plan status:  Psychosocial Support/Ongoing Assessment of Needs Other assessment/ plan:   FL2  PASARR- existing   Information/referral to community resources:   SNF    PATIENT'S/FAMILY'S RESPONSE TO PLAN OF CARE: Pt is agreeable to return to [facility] upon dc.  [facility] is also agreeable to accept pt once medically dc.  Pt thanked CSW for assistance with transistion.       Nonnie Done, [place] [phone]  Psychiatric & Orthopedics (5N 1-16) Clinical Social Worker

## 2014-07-14 NOTE — Discharge Summary (Signed)
  BP 121/60  Pulse 61  Temp(Src) 97.4 F (36.3 C) (Oral)  Resp 16  Ht 5\' 2"  (1.575 m)  Wt 66.225 kg (146 lb)  BMI 26.70 kg/m2  SpO2 96% Physician Discharge Summary  Patient ID: Victoria Molina MRN: [id number] DOB/AGE: 1945-12-03 68 y.o.  Admit date: 07/09/2014 Discharge date: 07/14/2014  Admission Diagnoses:csf leak  Discharge Diagnoses:  Active Problems:   Postoperative CSF leak   Discharged Condition: good  Hospital Course: Victoria Molina was admitted to the hospital for a presumed csf leak which I had confirmed by MRI. However by the time of surgery ~2 weeks after the MRI the dura had sealed. I during her operation was not able to see any spinal fluid. She did very well after surgery not having headaches, nor other problems. She will be discharged with a normal neuro exam, and no headaches.   Treatments: surgery: Wound exploration for csf leak  Discharge Exam: Blood pressure 121/60, pulse 61, temperature 97.4 F (36.3 C), temperature source Oral, resp. rate 16, height 5\' 2"  (1.575 m), weight 66.225 kg (146 lb), SpO2 96.00%. General appearance: alert, cooperative, appears stated age and no distress Neurologic: Alert and oriented X 3, normal strength and tone. Normal symmetric reflexes. Normal coordination and gait  Disposition: 01-Home or Self Care Cerebral Spinal Fluid Leak    Medication List         amLODipine 5 MG tablet  Commonly known as:  NORVASC  Take 5 mg by mouth daily.     CINNAMON PO  Take 2,000 mg by mouth daily.     clorazepate 7.5 MG tablet  Commonly known as:  TRANXENE  Take 7.5 mg by mouth 2 (two) times daily as needed for anxiety.     dicyclomine 10 MG capsule  Commonly known as:  BENTYL  Take 10 mg by mouth 2 (two) times daily.     isosorbide dinitrate 10 MG tablet  Commonly known as:  ISORDIL  Take 10 mg by mouth 2 (two) times daily.     omeprazole 20 MG capsule  Commonly known as:  PRILOSEC  Take 20 mg by mouth daily.     polyethylene  glycol packet  Commonly known as:  MIRALAX / GLYCOLAX  Take 17 g by mouth daily as needed (constipation).           Follow-up Information   Follow up with Davaughn Hillyard L, MD In 1 week. (call to make an appointment for suture removal)    Specialty:  Neurosurgery   Contact information:   [address] [place] [address] [phone]       Signed: Ashyia Schraeder L 07/14/2014, 10:05 AM

## 2014-07-14 NOTE — Discharge Instructions (Signed)
Call if temp >101.5 Call if wound drains, becomes very red, tender Call if there is a change in exam.  You may shower, the wound may become wet-pat dry after showering

## 2014-07-14 NOTE — Progress Notes (Signed)
Physical Therapy Treatment Patient Details Name: Victoria Molina MRN: [id number] DOB: Jan 03, 1946 Today's Date: 07/14/2014    History of Present Illness Patient underwent lumbar fusion ~1 month ago, during the case there was a durotomy, which MD attempted to repair primarily.  Readmitted for CSF leak repair    PT Comments    Expected pt to d/c at an earlier date from initial evaluation. Due to pt still being admitted have provided further treatment and set physical therapy goals for more independent level. Min assist for stability with ambulation without an assistive device, Mod I with use of a rolling walker. Concur with previous assessment for pt to return to SNF if pt does not have 24 hour assistance at home.  Follow Up Recommendations  SNF     Equipment Recommendations  Rolling walker with 5" wheels    Recommendations for Other Services       Precautions / Restrictions Precautions Precautions: Back Precaution Comments: Reviewed back precautions Restrictions Weight Bearing Restrictions: No    Mobility  Bed Mobility Overal bed mobility: Needs Assistance Bed Mobility: Rolling;Sidelying to Sit;Sit to Sidelying Rolling: Modified independent (Device/Increase time) Sidelying to sit: Supervision     Sit to sidelying: Modified independent (Device/Increase time) General bed mobility comments: Supervision for safety with cues for proper spinal alignment to prevent twisting with sidelying to sit transition. Practiced several times from both sides of bed.  Transfers Overall transfer level: Needs assistance Equipment used: Rolling walker (2 wheeled);1 person hand held assist Transfers: Sit to/from Stand Sit to Stand: Modified independent (Device/Increase time);Min assist         General transfer comment: Mod I with rolling walker, Min assist for hand held support and balance without assistive device.  Ambulation/Gait Ambulation/Gait assistance: Modified independent  (Device/Increase time);Min assist Ambulation Distance (Feet): 150 Feet (x2 Min assist for distance of 20 feet with hand held assist) Assistive device: Rolling walker (2 wheeled);1 person hand held assist Gait Pattern/deviations: WFL(Within Functional Limits)   Gait velocity interpretation: Below normal speed for age/gender General Gait Details: Mod I with rolling walker up to 150 feet, focused on increasing gait speed and VC for forward gaze. Min assist for stability for short distance without assistive device while ambulating with hand held assist.   Stairs            Wheelchair Mobility    Modified Rankin (Stroke Patients Only)       Balance                                    Cognition Arousal/Alertness: Awake/alert Behavior During Therapy: WFL for tasks assessed/performed Overall Cognitive Status: Within Functional Limits for tasks assessed                      Exercises General Exercises - Lower Extremity Ankle Circles/Pumps: AROM;Both;10 reps;Seated Long Arc Quad: Strengthening;Right;10 reps;Seated Hip Flexion/Marching: Strengthening;Both;10 reps;Standing Mini-Sqauts: Strengthening;Both;10 reps;Standing    General Comments        Pertinent Vitals/Pain Pain Assessment: 0-10 Pain Score:  ("I'm just sore" no value given) Pain Location: back Pain Descriptors / Indicators: Sore Pain Intervention(s): Monitored during session;Repositioned    Home Living                      Prior Function            PT Goals (current goals can now be found  in the care plan section) Acute Rehab PT Goals PT Goal Formulation: With patient Progress towards PT goals: Progressing toward goals    Frequency  Min 3X/week    PT Plan Other (comment) (Set PT goals today)    Co-evaluation             End of Session   Activity Tolerance: Patient tolerated treatment well Patient left: in bed;with call bell/phone within reach     Time:  0941-1008 PT Time Calculation (min): 27 min  Charges:  $Therapeutic Exercise: 8-22 mins $Therapeutic Activity: 8-22 mins                    G Codes:      Elayne Snare, [place]  Ellouise Newer 07/14/2014, 11:23 AM

## 2014-07-14 NOTE — Progress Notes (Signed)
Patient discharged to [facility] in [place] via personal transportation with husband. VS stable and discharge instructions given to patient.

## 2014-07-15 NOTE — Progress Notes (Signed)
OK per MD for d/c today back to [facility] via car with her husband. She refused EMS transport as she stated that the ride was very uncomfortable (bumpy and rough).  Husband in room and confirmed transportation plan.  Nursing notified and called report.  DC summary was sent to facility by Nonnie Done, LCSWA and Irven Shelling, Admissions at [facility] was notified of patient's return.  Patient and husband were pleased with d/c plan and denied any questions or concerns.  CSW signing off.  Lorie Phenix. Pauline Good, [place]

## 2014-07-16 NOTE — Op Note (Signed)
07/09/2014  5:21 PM  PATIENT:  Victoria Molina  68 y.o. female  PRE-OPERATIVE DIAGNOSIS:  Cerebral Spinal Fluid Leak  POST-OPERATIVE DIAGNOSIS:  Cerebral Spinal Fluid Leak  PROCEDURE:  Procedure(s): REPAIR OF CEREBROSPINAL FLUID LEAK  SURGEON:  Surgeon(s): Ashok Pall, MD  ASSISTANTS:none  ANESTHESIA:   general  EBL:     BLOOD ADMINISTERED:none  CELL SAVER GIVEN:none  COUNT:per nursing  DRAINS: none   SPECIMEN:  No Specimen  DICTATION: [name] was admitted and taken to the operating room for repair of a spinal fluid leak. She was placed under a general anesthetic without difficulty. She was positioned prone on the wilson frame with all pressure points padded. Her back was prepped and draped in a sterile manner. I opened the inferior 2/3 of the incision and dissected sharply to the thoracolumbar fascia. I then exposed the pedicle screws at L5. Using that as a guide I exposed the dura. I was not able with multiple maneuvers to see leaking csf, nor provoke its expression from the theca. There was also no pocket of csf when I opened the incision, subcutaneous, or subfascial csf collections. I nevertheless placed a piece of duragen over the sutures which I had placed at the initial operation, and some duraseal.  I removed more of L5 to makes sure I had full exposure of the repair site.  I then closed the wound in layers using vicryl sutures to approximate the thoracolumbar, subcutaneous, and subcuticular planes. I used a nylon and vertical mattress sutures to approximate the skin edges. I applied a sterile dressing.   PLAN OF CARE: Admit to inpatient   PATIENT DISPOSITION:  PACU - hemodynamically stable.   Delay start of Pharmacological VTE agent (>24hrs) due to surgical blood loss or risk of bleeding:  yes

## 2014-07-26 ENCOUNTER — Other Ambulatory Visit: Payer: Self-pay

## 2014-07-27 ENCOUNTER — Telehealth: Payer: Self-pay | Admitting: Gastroenterology

## 2014-07-27 NOTE — Telephone Encounter (Signed)
Patient called to see if we received anything from her pharmacy [facility] in [place]) regarding her getting more refills on her omeprazole. Please advise [phone]

## 2014-07-28 ENCOUNTER — Telehealth: Payer: Self-pay | Admitting: Gastroenterology

## 2014-07-28 ENCOUNTER — Telehealth: Payer: Self-pay

## 2014-07-28 MED ORDER — OMEPRAZOLE 20 MG PO CPDR: 20.0000 mg | DELAYED_RELEASE_CAPSULE | Freq: Every day | ORAL | Status: DC

## 2014-07-28 NOTE — Telephone Encounter (Signed)
Pt said the pharmacy has been unable to get in touch with [place] to refill her Omeprazole.

## 2014-07-28 NOTE — Telephone Encounter (Signed)
Pt is aware that Rx was sent in.

## 2014-07-28 NOTE — Telephone Encounter (Signed)
Doris, please log a ticket with [employer] and have them merge accounts.  Routing to [employer]

## 2014-07-28 NOTE — Telephone Encounter (Signed)
There appears to be an issue because there are 2 accounts with same address and DOB, different SS#. Refill from pharmacy and telephone notes regarding refill in MRN [id number].  Please look into these discrepancies (ie different SS#, are these the same people). ?merge accounts?  Refill provided on these patient has requested.

## 2014-07-28 NOTE — Telephone Encounter (Signed)
PATIENT CALLED AND LEFT MESSAGE REGARDING PRESCRIPTION  PLEASE [facility] [phone]

## 2014-07-28 NOTE — Telephone Encounter (Signed)
I called and spoke to Harrell Gave who will get it taken care of.

## 2014-07-29 ENCOUNTER — Encounter (HOSPITAL_COMMUNITY): Payer: Self-pay | Admitting: Neurosurgery

## 2014-08-04 NOTE — Telephone Encounter (Signed)
Pt was having trouble getting Omeprazole but she has gotten it now.

## 2014-09-14 ENCOUNTER — Encounter: Payer: Self-pay | Admitting: Gastroenterology

## 2014-09-14 ENCOUNTER — Ambulatory Visit (INDEPENDENT_AMBULATORY_CARE_PROVIDER_SITE_OTHER): Payer: Medicare Other | Admitting: Gastroenterology

## 2014-09-14 VITALS — BP 113/72 | HR 81 | Temp 97.6°F | Ht 62.0 in | Wt 144.0 lb

## 2014-09-14 DIAGNOSIS — R1011 Right upper quadrant pain: Secondary | ICD-10-CM

## 2014-09-14 MED ORDER — OMEPRAZOLE 20 MG PO CPDR: 20.0000 mg | DELAYED_RELEASE_CAPSULE | Freq: Two times a day (BID) | ORAL | Status: DC
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
## 2014-09-14 NOTE — Patient Instructions (Signed)
Increase Prilosec to twice a day. Go ahead and double up on your medication you are taking now so that you take 1 capsule 30 minutes before breakfast and 1 before dinner.   I have sent a new prescription for Prilosec twice a day to start taking when you run out of your current prescription.  We will see you back in 4 weeks!

## 2014-09-15 NOTE — Assessment & Plan Note (Signed)
Persistent RUQ pain in the setting of known gastritis. EGD on file, gallbladder remains in situ as noted above. Symptoms of weight loss and abdominal pain awakening at night concerning for occult process. I would like to proceed with a CT now; however, patient is declining this due to other health issues. Discussed the need for further evaluation due to concerning symptoms; however, patient agreeable to returning in 4 weeks for close follow-up. In interim, increase Prilosec to BID, which has helped in the past.

## 2014-09-21 NOTE — Progress Notes (Signed)
No pcp available

## 2014-10-07 ENCOUNTER — Telehealth: Payer: Self-pay | Admitting: Gastroenterology

## 2014-10-07 NOTE — Telephone Encounter (Signed)
Patient said that Omeprazole isn't helping her and it may be because she had back surgery and is having to lay flat, but she is asking for SF to recommend something else not so expensive for her to be called into [facility]. Please advise [phone]

## 2014-10-07 NOTE — Telephone Encounter (Signed)
She is taking it BID but it is not working. Please advise

## 2014-10-08 MED ORDER — PANTOPRAZOLE SODIUM 40 MG PO TBEC: 40.0000 mg | DELAYED_RELEASE_TABLET | Freq: Two times a day (BID) | ORAL | Status: DC

## 2014-10-08 NOTE — Telephone Encounter (Signed)
Previously tried Nexium, Prilosec, Aciphex, Dexilant. Appears Protonix while inpatient. I will send Protonix in.   I am concerned about her weight loss and persistent pain/GERD. I offered a CT at last appt, but she wanted to wait on this.   Let's do Protonix BID, and I highly recommend a CT with pancreatic protocol.

## 2014-10-08 NOTE — Addendum Note (Signed)
Addended by: Orvil Feil on: 10/08/2014 11:37 AM   Modules accepted: Orders

## 2014-10-12 NOTE — Telephone Encounter (Signed)
May order CT with pancreatic protocol.

## 2014-10-12 NOTE — Telephone Encounter (Signed)
REVIEWED. AGREE. NO ADDITIONAL RECOMMENDATIONS. 

## 2014-10-12 NOTE — Telephone Encounter (Signed)
Pt is aware of new prescription. She declines the CT at this time. She wants to wait until her appt with Dr. Oneida Alar in Jan then she said she will do the CT.

## 2014-10-12 NOTE — Telephone Encounter (Signed)
Pt called this am and is now wanting to proceed with CT. States she has changed her mind and is wanting to go ahead and get this over with.

## 2014-10-12 NOTE — Telephone Encounter (Signed)
Routing to DS- this is an [facility] pt.

## 2014-10-13 ENCOUNTER — Other Ambulatory Visit: Payer: Self-pay

## 2014-10-13 DIAGNOSIS — R1011 Right upper quadrant pain: Secondary | ICD-10-CM

## 2014-10-13 NOTE — Telephone Encounter (Signed)
PT NEEDS PREMD FOR IV DYE ALLERGY.

## 2014-10-13 NOTE — Telephone Encounter (Signed)
Pt is allergic to iodinated contrast agents. Do you want CT w/o contrast or do you want to premedicate.

## 2014-10-18 ENCOUNTER — Other Ambulatory Visit: Payer: Self-pay

## 2014-10-18 ENCOUNTER — Telehealth: Payer: Self-pay

## 2014-10-18 ENCOUNTER — Telehealth: Payer: Self-pay | Admitting: Gastroenterology

## 2014-10-18 DIAGNOSIS — Z91041 Radiographic dye allergy status: Secondary | ICD-10-CM

## 2014-10-18 DIAGNOSIS — R1011 Right upper quadrant pain: Secondary | ICD-10-CM

## 2014-10-18 MED ORDER — DIPHENHYDRAMINE HCL 50 MG PO TABS: ORAL_TABLET | ORAL | Status: DC

## 2014-10-18 MED ORDER — PREDNISONE 50 MG PO TABS: ORAL_TABLET | ORAL | Status: DC

## 2014-10-18 NOTE — Telephone Encounter (Signed)
Patient asked for someone to call this number instead. [phone]

## 2014-10-18 NOTE — Telephone Encounter (Signed)
Called pt and LMOM regarding CT scan. Instructed on message to call office back regarding special instructions prior to test.

## 2014-10-18 NOTE — Telephone Encounter (Signed)
Called pt back and made her aware of her CT scan appointment. It is scheduled for 10/20/2014 @ 1:30pm. Pt is aware that she needs to go to [facility] radiology to pick up oral contrast on (10/19/14) and she is aware of medication she needs to take prior to scan due to her allergy. Medication was sent to pts pharmacy. Spoke with Whit at [facility] and no pre cert is required.

## 2014-10-18 NOTE — Telephone Encounter (Signed)
Patient is scheduled a CT and said it's too early for her and could she change it to something later in the day. Please call [phone]

## 2014-10-18 NOTE — Telephone Encounter (Signed)
Done pt is aware ?

## 2014-10-18 NOTE — Telephone Encounter (Signed)
Yes, needs premedication.   Needs Prednisone 50 mg 13 hours prior to CT, 7 hours prior, and 1 hour prior. Needs Benadryl 50 mg 1 hour prior (in addition to the Prednisone 50 mg 1 hour prior). It appears that Dr. Oneida Alar sent the prednisone in.

## 2014-10-19 NOTE — Progress Notes (Signed)
REVIEWED. Pt only needs CT ABD W/ PO AND ORAL CONTRAST W/ PREMED FOR DYE ALLERGY.

## 2014-10-20 ENCOUNTER — Ambulatory Visit (HOSPITAL_COMMUNITY)
Admission: RE | Admit: 2014-10-20 | Discharge: 2014-10-20 | Disposition: A | Discharge status: Home / Self Care | Payer: Medicare Other | Source: Ambulatory Visit | Attending: Gastroenterology | Admitting: Gastroenterology

## 2014-10-20 ENCOUNTER — Other Ambulatory Visit (HOSPITAL_COMMUNITY): Payer: Medicare Other

## 2014-10-20 DIAGNOSIS — R1011 Right upper quadrant pain: Secondary | ICD-10-CM | POA: Insufficient documentation

## 2014-10-20 DIAGNOSIS — N289 Disorder of kidney and ureter, unspecified: Secondary | ICD-10-CM | POA: Diagnosis not present

## 2014-10-20 DIAGNOSIS — I700 Atherosclerosis of aorta: Secondary | ICD-10-CM | POA: Insufficient documentation

## 2014-10-20 DIAGNOSIS — Z981 Arthrodesis status: Secondary | ICD-10-CM | POA: Diagnosis not present

## 2014-10-20 DIAGNOSIS — Z91041 Radiographic dye allergy status: Secondary | ICD-10-CM

## 2014-10-20 LAB — POCT I-STAT CREATININE: Creatinine, Ser: 0.6 mg/dL (ref 0.50–1.10)

## 2014-10-20 MED ORDER — IOHEXOL 300 MG/ML  SOLN
100.0000 mL | Freq: Once | INTRAMUSCULAR | Status: AC | PRN
  Administered 2014-10-20: 100 mL via INTRAVENOUS

## 2014-11-01 ENCOUNTER — Ambulatory Visit: Payer: Medicare Other | Admitting: Gastroenterology

## 2014-11-04 ENCOUNTER — Ambulatory Visit (INDEPENDENT_AMBULATORY_CARE_PROVIDER_SITE_OTHER): Payer: Medicare Other | Admitting: Gastroenterology

## 2014-11-04 ENCOUNTER — Encounter: Payer: Self-pay | Admitting: Gastroenterology

## 2014-11-04 VITALS — BP 131/84 | HR 74 | Temp 98.0°F | Ht 62.0 in | Wt 150.0 lb

## 2014-11-04 DIAGNOSIS — R1011 Right upper quadrant pain: Secondary | ICD-10-CM

## 2014-11-04 DIAGNOSIS — K5901 Slow transit constipation: Secondary | ICD-10-CM

## 2014-11-04 DIAGNOSIS — R1013 Epigastric pain: Secondary | ICD-10-CM

## 2014-11-04 NOTE — Patient Instructions (Signed)
USE NAPROXEN AT BEDTIME WITH A SMALL AMOUNT OF FOOD OR MILK.  USE TWO REGULAR STRENGTH TYLENOL IF NEEDED FOR ADDITIONAL PAIN RELIEF.  COLD PACK TO RUQ FOR 10 MINS EACH NIGHT FOR 3 WEEKS  FOLLOW UP IN 4 MOS.

## 2014-11-04 NOTE — Progress Notes (Signed)
ON RECALL LIST  °

## 2014-11-04 NOTE — Assessment & Plan Note (Signed)
SX STABLE.  CONTINUE TO MONITOR SYMPTOMS. CONTINUE PROTONIX. TAKE 30 MINUTES PRIOR TO MEALS TWICE DAILY. FOLLOW UP IN 4 MOS.

## 2014-11-04 NOTE — Assessment & Plan Note (Signed)
MOST LIKELY DUE TO ABD WALL PROCESS. PT NEEDS TO USE UPPER EXTREMITIES TO MANEUVER.  NAPROXEN AT BEDTIME WITH A SML AMOUNT OF FOOD OR MILK. USE TWO REG STRENGTH TYLENOL IF NEEDED FOR ADDITIONAL PAIN RELIEF. COLD PACK TO RUQ FOR 10 MINS EACH NIGHT FOR 3 WEEKS FOLLOW UP IN 4 MOS.
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
## 2014-11-04 NOTE — Assessment & Plan Note (Signed)
SX CONTROLLED WITH MIRALAX PRN.  MIRALAX PRN CONTINUE TO MONITOR SYMPTOMS.

## 2014-11-10 NOTE — Progress Notes (Signed)
cc'ed to pcp °

## 2014-12-09 ENCOUNTER — Telehealth: Payer: Self-pay | Admitting: Gastroenterology

## 2014-12-09 ENCOUNTER — Other Ambulatory Visit: Payer: Self-pay

## 2014-12-09 DIAGNOSIS — R1110 Vomiting, unspecified: Principal | ICD-10-CM

## 2014-12-09 DIAGNOSIS — IMO0001 Reserved for inherently not codable concepts without codable children: Secondary | ICD-10-CM

## 2014-12-09 NOTE — Telephone Encounter (Signed)
PATIENT CALLED STATING THAT HER HEARTBURN IS NO BETTER.  PLEASE CALL PATIENT ON CELL [phone]

## 2014-12-09 NOTE — Telephone Encounter (Signed)
Pt is aware and OK to schedule the swallowing test. She prefers to hold off going to [facility] for now until she gets the Swallowing test over.

## 2014-12-09 NOTE — Telephone Encounter (Signed)
Called and [facility] regarding appointment for swallowing test. Pt is set up for 12/15/2014 @ 11:15 am at [facility].

## 2014-12-09 NOTE — Telephone Encounter (Signed)
I called pt and she said the Protonix is hot helping her reflux. She has times when it feels like the food is coming back up. Some burning sensation in her chest. Please advise!

## 2014-12-09 NOTE — Telephone Encounter (Signed)
REVIEWED-NO ADDITIONAL RECOMMENDATIONS. 

## 2014-12-09 NOTE — Telephone Encounter (Signed)
PLEASE CALL PT. SHE CAN'T AFFORD DEXILANT OR ACIPHEX  SO PROTONIX BID IS THE BEST SHE CAN DO. AVOID REFLUX TRIGGERS. AND FOLLOW  A LOW FAT DIET.   SHE RECENTLY HAD AN  UPPER ENDOSCOPY MAR 2015. SHE NEEDS BARIUM SWALLOW TEST(Dx; UNCONTROLLED REGURGITATION) TO SEE IF SHE HAS ANY STRICTURE OR IS THIS JUST REFLUX AND HER WEAK ESOPHAGUS. SHE NEEDS TO SEE GI DOCTOR AT [place] TO DISCUSS ADDITIONAL MANAGEMENT OPTIONS FOR HER REFLUX.

## 2014-12-15 ENCOUNTER — Other Ambulatory Visit (HOSPITAL_COMMUNITY): Payer: Medicare Other

## 2014-12-22 ENCOUNTER — Ambulatory Visit (HOSPITAL_COMMUNITY)
Admission: RE | Admit: 2014-12-22 | Discharge: 2014-12-22 | Disposition: A | Discharge status: Home / Self Care | Payer: Medicare Other | Source: Ambulatory Visit | Attending: Gastroenterology | Admitting: Gastroenterology

## 2014-12-22 DIAGNOSIS — R933 Abnormal findings on diagnostic imaging of other parts of digestive tract: Secondary | ICD-10-CM | POA: Diagnosis not present

## 2014-12-22 DIAGNOSIS — R1110 Vomiting, unspecified: Secondary | ICD-10-CM | POA: Insufficient documentation

## 2014-12-22 DIAGNOSIS — K449 Diaphragmatic hernia without obstruction or gangrene: Secondary | ICD-10-CM | POA: Diagnosis not present

## 2014-12-22 DIAGNOSIS — IMO0001 Reserved for inherently not codable concepts without codable children: Secondary | ICD-10-CM

## 2014-12-25 ENCOUNTER — Telehealth: Payer: Self-pay | Admitting: Gastroenterology

## 2014-12-25 NOTE — Telephone Encounter (Signed)
PLEASE CALL PT. HER BARIUM SWALLOW SHOWS A WEAK ESOPHAGUS DUE TO AGING. CONTINUE A SOFT MECHANICAL DIET.  MEATS SHOULD BE CHOPPED OR GROUND ONLY. DO NOT EAT CHUNKS OF ANYTHING. SHE NEEDS TO SEE THE GI DOCTOR AT [place] TO DISCUSS ADDITIONAL MANAGEMENT & OPTIONS FOR HER REFLUX.

## 2014-12-27 ENCOUNTER — Other Ambulatory Visit: Payer: Self-pay

## 2014-12-27 DIAGNOSIS — K219 Gastro-esophageal reflux disease without esophagitis: Secondary | ICD-10-CM

## 2014-12-27 NOTE — Telephone Encounter (Signed)
LMOM to call.

## 2014-12-27 NOTE — Telephone Encounter (Signed)
Referral has been made to [facility] 

## 2014-12-27 NOTE — Telephone Encounter (Signed)
Pt is aware of results and she is fine with the referral to [facility]. She said to call her on her cell phone [phone]

## 2014-12-31 NOTE — Telephone Encounter (Signed)
Pt has appointment at [facility] on 01/31/15 @ 3:00 pm. She is aware of appointment

## 2015-01-12 ENCOUNTER — Other Ambulatory Visit: Payer: Self-pay

## 2015-01-17 MED ORDER — POLYETHYLENE GLYCOL 3350 17 G PO PACK: 17.0000 g | PACK | Freq: Every day | ORAL | Status: DC | PRN

## 2015-02-16 ENCOUNTER — Encounter: Payer: Self-pay | Admitting: Gastroenterology

## 2015-03-18 ENCOUNTER — Ambulatory Visit: Payer: Medicare Other | Admitting: Gastroenterology

## 2015-03-25 ENCOUNTER — Encounter: Payer: Self-pay | Admitting: Gastroenterology

## 2015-03-25 ENCOUNTER — Ambulatory Visit (INDEPENDENT_AMBULATORY_CARE_PROVIDER_SITE_OTHER): Payer: Medicare Other | Admitting: Gastroenterology

## 2015-03-25 VITALS — BP 127/78 | HR 72 | Temp 98.3°F | Ht 62.0 in | Wt 167.8 lb

## 2015-03-25 DIAGNOSIS — K5901 Slow transit constipation: Secondary | ICD-10-CM

## 2015-03-25 DIAGNOSIS — K219 Gastro-esophageal reflux disease without esophagitis: Secondary | ICD-10-CM
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
## 2015-03-25 NOTE — Patient Instructions (Signed)
Continue Protonix once daily.  Continue the extra fiber in your diet.  We will see you in 6 months!

## 2015-03-25 NOTE — Assessment & Plan Note (Signed)
Controlled with supplemental fiber.

## 2015-03-25 NOTE — Assessment & Plan Note (Signed)
Controlled on Protonix once daily. Keep follow-up with [facility] due to chronic RUQ pain. Continue Remeron as started by [facility]. Return in 6 months.

## 2015-04-06 NOTE — Progress Notes (Signed)
cc'd to pcp 

## 2015-04-11 ENCOUNTER — Telehealth: Payer: Self-pay | Admitting: Gastroenterology

## 2015-04-11 NOTE — Telephone Encounter (Signed)
Pt is aware to be here tomorrow 15 min prior to OV appt at 9:30 AM.

## 2015-04-11 NOTE — Telephone Encounter (Signed)
Would wait until appt tomorrow for recommendations. Difficult to do over the phone, and since she has an appt tomorrow, can discuss then.

## 2015-04-11 NOTE — Telephone Encounter (Signed)
Pt was seen by AS on 03/25/15 and called today wanting to be seen again because she is having burning in her chest and doesn't think her medicine is helping her. I offered to have the nurse to call her and advise maybe changing the prescription after talking with the provider since she was seen recently, but patient said that she would rather hold the appointment spot for tomorrow with LSL until she heard back from the nurse first. OV was made for 6/21 at 0930 with LSL. Please call her cell [phone]

## 2015-04-11 NOTE — Telephone Encounter (Signed)
I called pt. She said she does not feel the Protonix is helping her. Her pain is more in the center of her chest. She has follow up at [facility] in September, but said they had told her if she needed anything earlier to call. Pt wants to keep appt here tomorrow, or find out if Protonix can be changed. Please advise!

## 2015-04-12 ENCOUNTER — Ambulatory Visit (INDEPENDENT_AMBULATORY_CARE_PROVIDER_SITE_OTHER): Payer: Medicare Other | Admitting: Gastroenterology

## 2015-04-12 ENCOUNTER — Encounter: Payer: Self-pay | Admitting: Gastroenterology

## 2015-04-12 ENCOUNTER — Ambulatory Visit: Payer: Medicare Other | Admitting: Gastroenterology

## 2015-04-12 VITALS — BP 114/71 | HR 82 | Temp 98.0°F | Ht 62.0 in | Wt 166.8 lb

## 2015-04-12 DIAGNOSIS — R1011 Right upper quadrant pain: Secondary | ICD-10-CM

## 2015-04-12 DIAGNOSIS — R1013 Epigastric pain: Secondary | ICD-10-CM | POA: Diagnosis not present

## 2015-04-12 DIAGNOSIS — K219 Gastro-esophageal reflux disease without esophagitis: Secondary | ICD-10-CM | POA: Diagnosis not present

## 2015-04-12 MED ORDER — OMEPRAZOLE 20 MG PO CPDR: 20.0000 mg | DELAYED_RELEASE_CAPSULE | Freq: Every day | ORAL | Status: DC

## 2015-04-12 NOTE — Progress Notes (Signed)
Primary Care Physician: [name], MD  Primary Gastroenterologist:  Barney Drain, MD   Chief Complaint  Patient presents with  . Gastrophageal Reflux    HPI: Victoria Molina is a 69 y.o. female here for follow-up of GERD, dyspepsia. She has chronic intermittent right upper quadrant pain associated with nausea and vomiting as well. Last seen on 03/25/2015. History of HIDA scan with EF of 91.4% but reproduction of symptoms with CCK (02/2014). Saw Dr. Arnoldo Morale but patient wanted to wait on elective cholecystectomy at that time. CT with pancreatic protocol due to weight loss and persistent pain without evidence of malignancy (09/2014). EGD with gastritis 2015. Barium pill esophagram with nonspecific esophageal motility disorder in March 2016. Seen at [facility] April 2016 due to dyspepsia started on Remeron. Advised decreased Protonix once daily at that time. Has follow-up at [facility] in September.   Presents today with complaints of burning the the chest. Works out inside with a video exercise routine or on treadmill and no burning in chest but when walking outside has burning in chest but it will go away. Some incline to her walking route. No SOB. No diaphoresis. Doesn't have to stop. Carries TUMS when walks outside and seems to help. Notices symptoms just in this setting and usually walks around 5pm daily. Trying to lose some weight. Similar symptoms reported back in 2014.   No heartburn with meals but sometimes a little discomfort after meals. RUQ and into the back still having it a lot. Notices it mostly at 5-6 am in the morning. No dysphagia. BM regular. No melena, brbpr.   Currently on pantoprazole once daily. Nexium too expensive previously. Seemed to work ok. Patient does not recall trying prilosec or omeprazole. Although records indicate that she was on BID 2015 (was not aware of this when patient was in office). Used to take baclofen to assist with GERD.       Current  Outpatient Prescriptions  Medication Sig Dispense Refill  . amLODipine (NORVASC) 5 MG tablet Take 5 mg by mouth daily.    . clorazepate (TRANXENE) 7.5 MG tablet Take 7.5 mg by mouth 2 (two) times daily as needed for anxiety.    . Cyanocobalamin (VITAMIN B 12 PO) Take 1,500 mcg by mouth daily.    . furosemide (LASIX) 20 MG tablet Take 20 mg by mouth as needed.     . isosorbide dinitrate (ISORDIL) 10 MG tablet Take 10 mg by mouth 3 (three) times daily.    Marland Kitchen loratadine (CLARITIN) 10 MG tablet Take 10 mg by mouth daily.    . methylcellulose (ARTIFICIAL TEARS) 1 % ophthalmic solution Place 1 drop into both eyes as needed (dry eyes).    . mirtazapine (REMERON) 15 MG tablet 15 mg at bedtime.     . Misc Natural Products (JOINT SUPPORT PO) Take 1 tablet by mouth daily.    . Multiple Vitamins-Minerals (MULTIVITAMIN WITH MINERALS) tablet Take 1 tablet by mouth daily.    . naproxen (NAPROSYN) 500 MG tablet Take 500 mg by mouth 2 (two) times daily as needed for mild pain.    Marland Kitchen omega-3 acid ethyl esters (LOVAZA) 1 G capsule Take 1 g by mouth daily.    . pantoprazole (PROTONIX) 40 MG tablet Take 1 tablet (40 mg total) by mouth 2 (two) times daily before a meal. (Patient taking differently: Take 40 mg by mouth daily. ) 60 tablet 3  . polyethylene glycol (MIRALAX / GLYCOLAX) packet Take 17 g  by mouth daily as needed (constipation). 30 packet 3  . potassium chloride (K-DUR,KLOR-CON) 10 MEQ tablet Take 10 mEq by mouth as needed.     . Vitamin D, Cholecalciferol, 1000 UNITS TABS Take 2,000 Units by mouth daily.     No current facility-administered medications for this visit.    Allergies as of 04/12/2015 - Review Complete 04/12/2015  Allergen Reaction Noted  . Percocet [oxycodone-acetaminophen]  06/01/2014  . Iodinated diagnostic agents Hives and Itching 12/27/2010  . Sulfa antibiotics Hives and Itching 12/27/2010  . Sulfa antibiotics Hives 05/13/2014  . Contrast media [iodinated diagnostic agents] Rash  05/13/2014  . Vioxx [rofecoxib] Swelling 05/08/2012    ROS:  General: Negative for anorexia, weight loss, fever, chills, fatigue, weakness. ENT: Negative for hoarseness, difficulty swallowing , nasal congestion. CV: Negative for chest pain, angina, palpitations, dyspnea on exertion, peripheral edema.  Respiratory: Negative for dyspnea at rest, dyspnea on exertion, cough, sputum, wheezing.  GI: See history of present illness. GU:  Negative for dysuria, hematuria, urinary incontinence, urinary frequency, nocturnal urination.  Endo: Negative for unusual weight change.    Physical Examination:   BP 114/71 mmHg  Pulse 82  Temp(Src) 98 F (36.7 C)  Ht 5\' 2"  (1.575 m)  Wt 166 lb 12.8 oz (75.66 kg)  BMI 30.50 kg/m2  General: Well-nourished, well-developed in no acute distress.  Eyes: No icterus. Mouth: Oropharyngeal mucosa moist and pink , no lesions erythema or exudate. Lungs: Clear to auscultation bilaterally.  Heart: Regular rate and rhythm, no murmurs rubs or gallops.  Abdomen: Bowel sounds are normal, nontender, nondistended, no hepatosplenomegaly or masses, no abdominal bruits or hernia , no rebound or guarding.   Extremities: No lower extremity edema. No clubbing or deformities. Neuro: Alert and oriented x 4   Skin: Warm and dry, no jaundice.   Psych: Alert and cooperative, normal mood and affect.   Imaging Studies: No results found.

## 2015-04-12 NOTE — Patient Instructions (Addendum)
1. Stop pantoprazole for now. (Current acid pill) 2. Start omeprazole 20 mg 30 minutes before breakfast daily. 3. Take 2 TUMS 30 minutes before exercising outside to prevent heartburn. If you develop shortness of breath, sweating, persistent burning in the chest, you should go to the ER to rule out heart problem.  4. Call [facility] to see if you can be seen sooner for ongoing right upper abdominal pain.  5. Call me if you having persistent burning in the chest.  6. OV in 08/2015 with Dr. Oneida Alar.

## 2015-04-13 NOTE — Progress Notes (Signed)
Cc'd to pcp 

## 2015-04-13 NOTE — Assessment & Plan Note (Signed)
Patient with what appears to be breakthrough heartburn with exercise. Denies shortness of breath, diaphoresis which would suggest more of a cardiac etiology. Patient is able to work out extensively within her home without any issues but if she works out in the heat and with an incline she begins having burning in the chest. Gets relief with TUMS.  Advised to take TUMS 30 minutes prior to exercise. Go back to omeprazole 20mg  daily in the morning before breakfast. Stop pantoprazole. Couldn't afford Dexilant, Aciphex, or Nexium previously.

## 2015-04-13 NOTE — Assessment & Plan Note (Signed)
Continue Remeron. Follow-up with [facility].

## 2015-04-13 NOTE — Assessment & Plan Note (Signed)
Unclear etiology. Symptoms noted more first thing in morning. Unrelated to meals. Associated with nausea. Complained of it in the office. ?dyspepsia. HIDA previously with reproduction of symptoms but symptoms very atypical of GB disease. Patient very anxious about possible having cancer. Tried to provide reassurance. Discussed previous workup in detail.  Recommend patient contact GI at [facility] to determine if she can be seen sooner than September since symptoms are still present on Remeron. She agreed.

## 2015-04-18 ENCOUNTER — Other Ambulatory Visit: Payer: Self-pay

## 2015-05-22 ENCOUNTER — Encounter: Payer: Self-pay | Admitting: Gastroenterology

## 2015-05-22 NOTE — Progress Notes (Signed)
REVIEWED-NO ADDITIONAL RECOMMENDATIONS. 

## 2015-06-22 ENCOUNTER — Other Ambulatory Visit: Payer: Self-pay | Admitting: Internal Medicine

## 2015-06-22 DIAGNOSIS — R319 Hematuria, unspecified: Secondary | ICD-10-CM

## 2015-06-24 ENCOUNTER — Ambulatory Visit
Admission: RE | Admit: 2015-06-24 | Discharge: 2015-06-24 | Disposition: A | Discharge status: Home / Self Care | Payer: Medicare Other | Source: Ambulatory Visit | Attending: Internal Medicine | Admitting: Internal Medicine

## 2015-06-24 DIAGNOSIS — R319 Hematuria, unspecified: Secondary | ICD-10-CM | POA: Diagnosis present

## 2015-06-24 DIAGNOSIS — N281 Cyst of kidney, acquired: Secondary | ICD-10-CM | POA: Diagnosis not present

## 2015-07-04 ENCOUNTER — Encounter: Payer: Self-pay | Admitting: Gastroenterology

## 2015-07-04 ENCOUNTER — Ambulatory Visit (INDEPENDENT_AMBULATORY_CARE_PROVIDER_SITE_OTHER): Payer: Medicare Other | Admitting: Gastroenterology

## 2015-07-04 VITALS — BP 123/77 | HR 70 | Temp 98.4°F | Ht 62.0 in | Wt 167.8 lb

## 2015-07-04 DIAGNOSIS — R1011 Right upper quadrant pain: Secondary | ICD-10-CM | POA: Diagnosis not present
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
## 2015-07-04 NOTE — Patient Instructions (Signed)
We will touch base once I review records from your upcoming appointment with the GI group at [facility].

## 2015-07-06 NOTE — Assessment & Plan Note (Signed)
Ongoing ruq pain, often wakes up with it, associated with nausea. Sometimes associated with meals. HIDA with reproduction of symptoms but symptoms are atypical for gallbladder disease. On Remeron for dyspepsia orchestrated by GI at [facility]. She has appointment with them next week for follow-up.  Advised that I would follow-up on recommendations from [place] when available. It is not clear that cholecystectomy would provide her relief from her right upper quadrant abdominal pain. She notes that she did have this pain prior to back surgery but seems to be worse since then.

## 2015-07-07 NOTE — Progress Notes (Signed)
cc'ed to pcp °

## 2015-08-03 ENCOUNTER — Encounter: Payer: Self-pay | Admitting: Gastroenterology

## 2015-08-26 ENCOUNTER — Ambulatory Visit (INDEPENDENT_AMBULATORY_CARE_PROVIDER_SITE_OTHER): Payer: Medicare Other | Admitting: Gastroenterology

## 2015-08-26 ENCOUNTER — Encounter: Payer: Self-pay | Admitting: Gastroenterology

## 2015-08-26 VITALS — BP 133/82 | HR 77 | Temp 97.6°F | Ht 62.0 in | Wt 168.0 lb

## 2015-08-26 DIAGNOSIS — R1011 Right upper quadrant pain: Secondary | ICD-10-CM | POA: Diagnosis not present

## 2015-08-26 DIAGNOSIS — R1013 Epigastric pain: Secondary | ICD-10-CM

## 2015-08-26 DIAGNOSIS — K219 Gastro-esophageal reflux disease without esophagitis: Secondary | ICD-10-CM | POA: Diagnosis not present

## 2015-08-26 DIAGNOSIS — R1012 Left upper quadrant pain: Secondary | ICD-10-CM | POA: Diagnosis not present

## 2015-08-26 MED ORDER — OMEPRAZOLE 20 MG PO CPDR: 20.0000 mg | DELAYED_RELEASE_CAPSULE | Freq: Two times a day (BID) | ORAL | Status: DC
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
## 2015-08-26 NOTE — Patient Instructions (Signed)
1. Increase omeprazole to 20mg  twice daily before breakfast and evening meal for next four weeks. RX sent to your pharmacy.  2. I will discuss your case with Dr. Oneida Alar. Further recommendations to follow.  3. Return office visit with Dr. Oneida Alar in 4-6 weeks.

## 2015-08-29 NOTE — Progress Notes (Signed)
CC'D TO PCP °

## 2015-09-29 ENCOUNTER — Ambulatory Visit: Payer: Medicare Other | Admitting: Gastroenterology

## 2015-10-06 ENCOUNTER — Encounter: Payer: Self-pay | Admitting: Gastroenterology

## 2015-10-06 ENCOUNTER — Ambulatory Visit (INDEPENDENT_AMBULATORY_CARE_PROVIDER_SITE_OTHER): Payer: Medicare Other | Admitting: Gastroenterology

## 2015-10-06 VITALS — BP 142/85 | HR 70 | Temp 98.1°F | Ht 62.0 in | Wt 167.0 lb

## 2015-10-06 DIAGNOSIS — R1012 Left upper quadrant pain: Secondary | ICD-10-CM

## 2015-10-06 DIAGNOSIS — R1011 Right upper quadrant pain: Secondary | ICD-10-CM | POA: Diagnosis not present

## 2015-10-06 DIAGNOSIS — K219 Gastro-esophageal reflux disease without esophagitis: Secondary | ICD-10-CM
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
## 2015-10-06 NOTE — Assessment & Plan Note (Signed)
LIKELY DUE TO MUSCULOSKELETAL PAIN(RIBS, ABD WALL). CLINICALLY IMPROVED. CT ABD/PELVIS UTD.  CONTINUE TO MONITOR SYMPTOMS.

## 2015-10-06 NOTE — Patient Instructions (Signed)
CONTINUE YOUR WEIGHT LOSS EFFORTS. LOSE 10 POUNDS.  CUT YOUR PORTION SIZE AND DRINK WATER.  APPLY ICE PACK THREE TIMES A DAY TO LEFT UPPER ABDOMEN TO REDUCE PAIN AND INFLAMMATION FOR 14 DAYS. DO NOT APPLY ICE PACKS DIRECTLY TO YOUR SKIN.  TAKE TWO ALEVE TODAY THEN ONE TWICE DAILY FOR 10 DAYS.  CONTINUE OMEPRAZOLE.  TAKE 30 MINUTES PRIOR TO YOUR MEALS TWICE DAILY.  FOLLOW UP IN 6 MOS. MERRY CHRISTMAS AND HAPPY NEW YEAR!

## 2015-10-06 NOTE — Assessment & Plan Note (Signed)
SYMPTOMS FAIRLY WELL CONTROLLED.  AVOID TRIGGERS. CONTINUE YOUR WEIGHT LOSS EFFORTS. LOSE 10 POUNDS. CUT YOUR PORTION SIZE AND DRINK WATER. CONTINUE OMEPRAZOLE.  TAKE 30 MINUTES PRIOR TO YOUR MEALS TWICE DAILY. FOLLOW UP IN 6 MOS.

## 2015-10-06 NOTE — Progress Notes (Signed)
ON RECALL  °

## 2015-10-06 NOTE — Progress Notes (Signed)
CC'ED TO PCP 

## 2015-10-06 NOTE — Assessment & Plan Note (Addendum)
DUE TO ABDOMINAL WALL PAIN/STRAIN. SYMPTOMS NOT IDEALLY CONTROLLED.  CONTINUE YOUR WEIGHT LOSS EFFORTS. LOSE 10 POUNDS. CUT YOUR PORTION SIZE AND DRINK WATER. APPLY ICE PACK THREE TIMES A DAY TO LEFT UPPER ABDOMEN TO REDUCE PAIN AND INFLAMMATION FOR 14 DAYS. DO NOT APPLY ICE PACKS DIRECTLY TO YOUR SKIN. TAKE TWO ALEVE TODAY THEN ONE TWICE DAILY FOR 10 DAYS. CONTINUE OMEPRAZOLE.  TAKE 30 MINUTES PRIOR TO YOUR MEALS TWICE DAILY. FOLLOW UP IN 6 MOS.   GREATER THAN 50% WAS SPENT IN COUNSELING & COORDINATION OF CARE WITH THE PATIENT: DISCUSSED DIFFERENTIAL DIAGNOSIS, BENEFITS, RISKS, AND MANAGEMENT OF ABDOMINAL WALL PAIN. TOTAL ENCOUNTER TIME: 15 MINS.

## 2015-10-30 IMAGING — CR DG CHEST 2V
1 series · 2 of 2 positions shown · non-contrast
Comparison: CT 06/20/2012.  Radiographs 06/11/2005.

CLINICAL DATA: Left-sided chest pain today.  No acute injury.

EXAM:
CHEST  2 VIEW

[Series 1: w chest pa · 0.14mm/px · 2 of 2 slices shown]
[im 1/2]
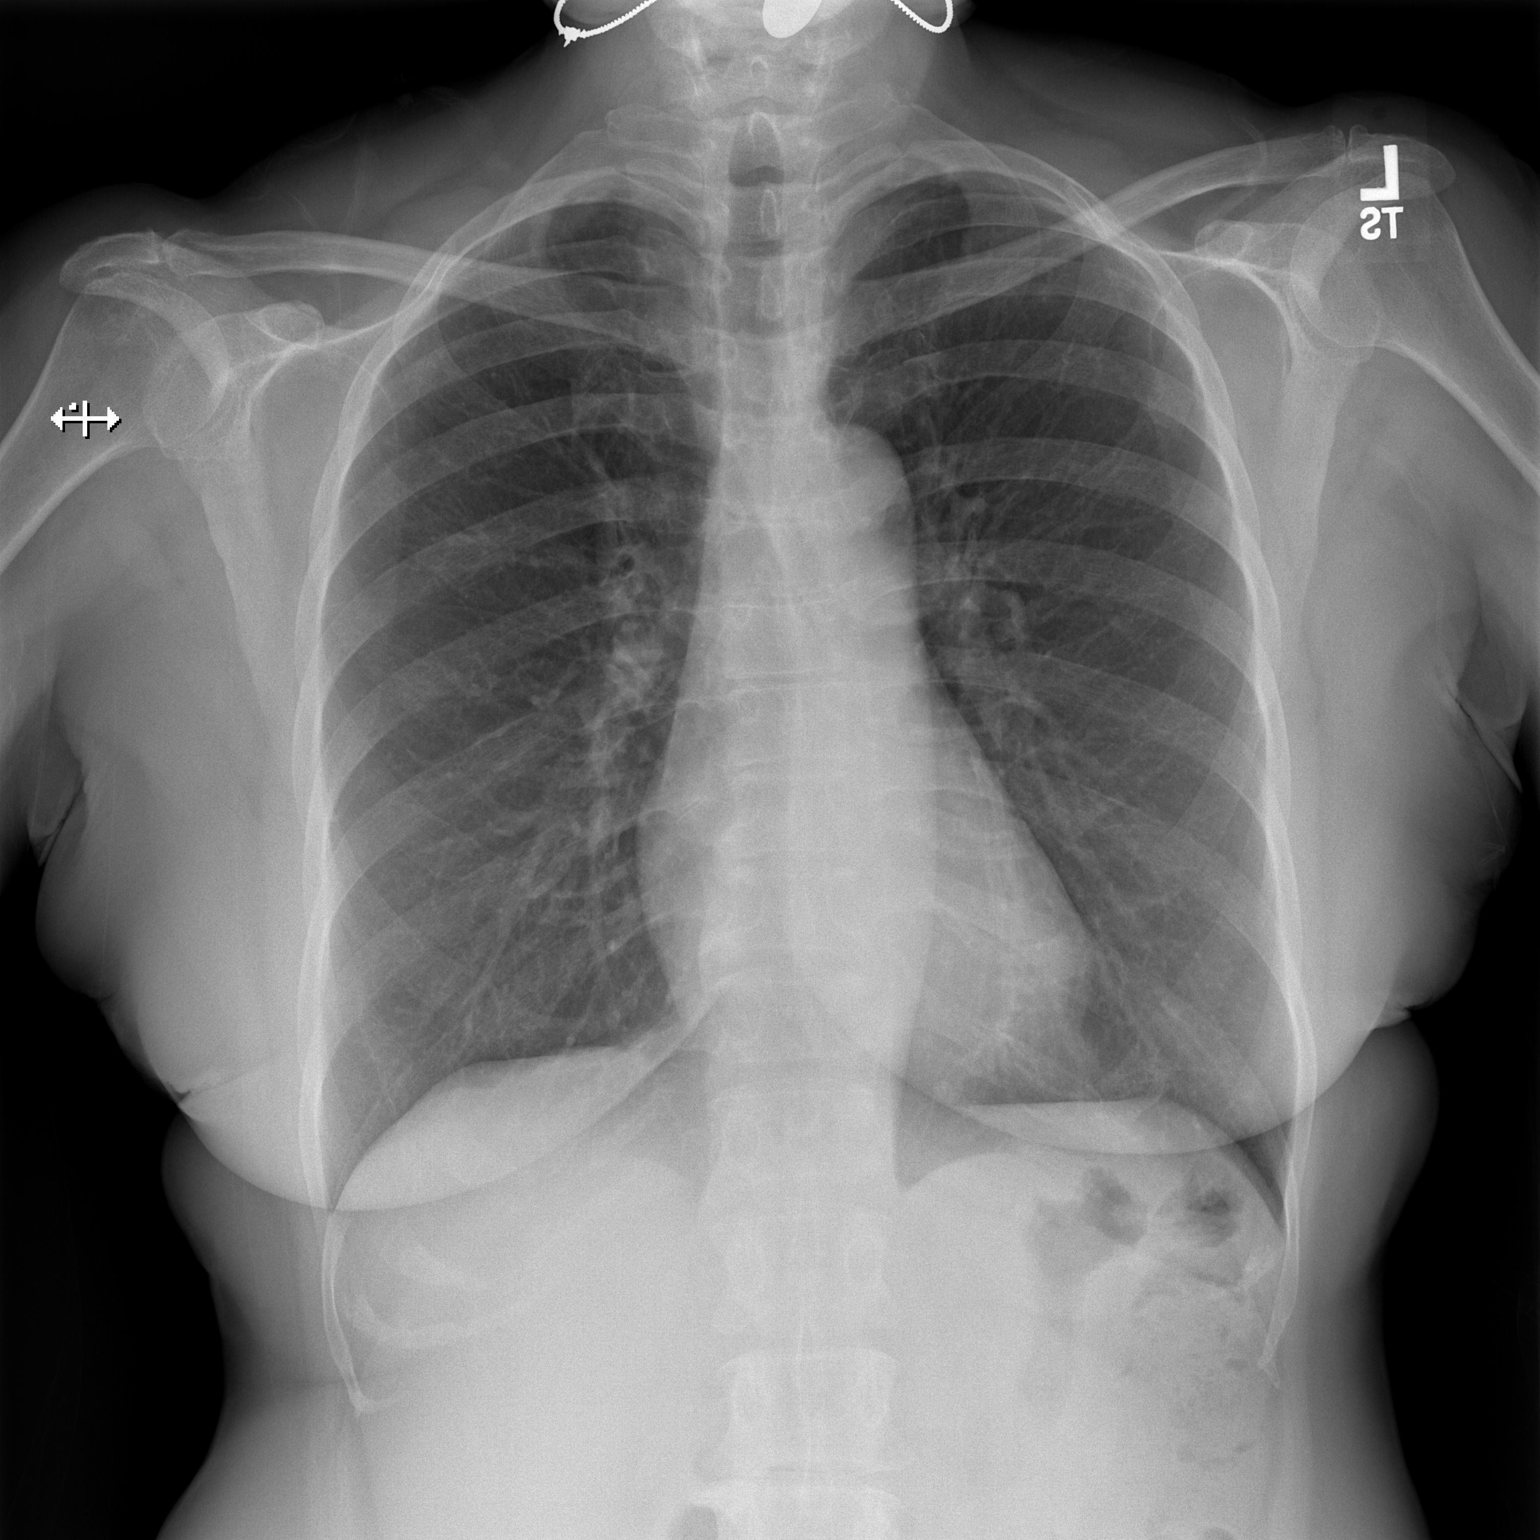
[im 2/2]
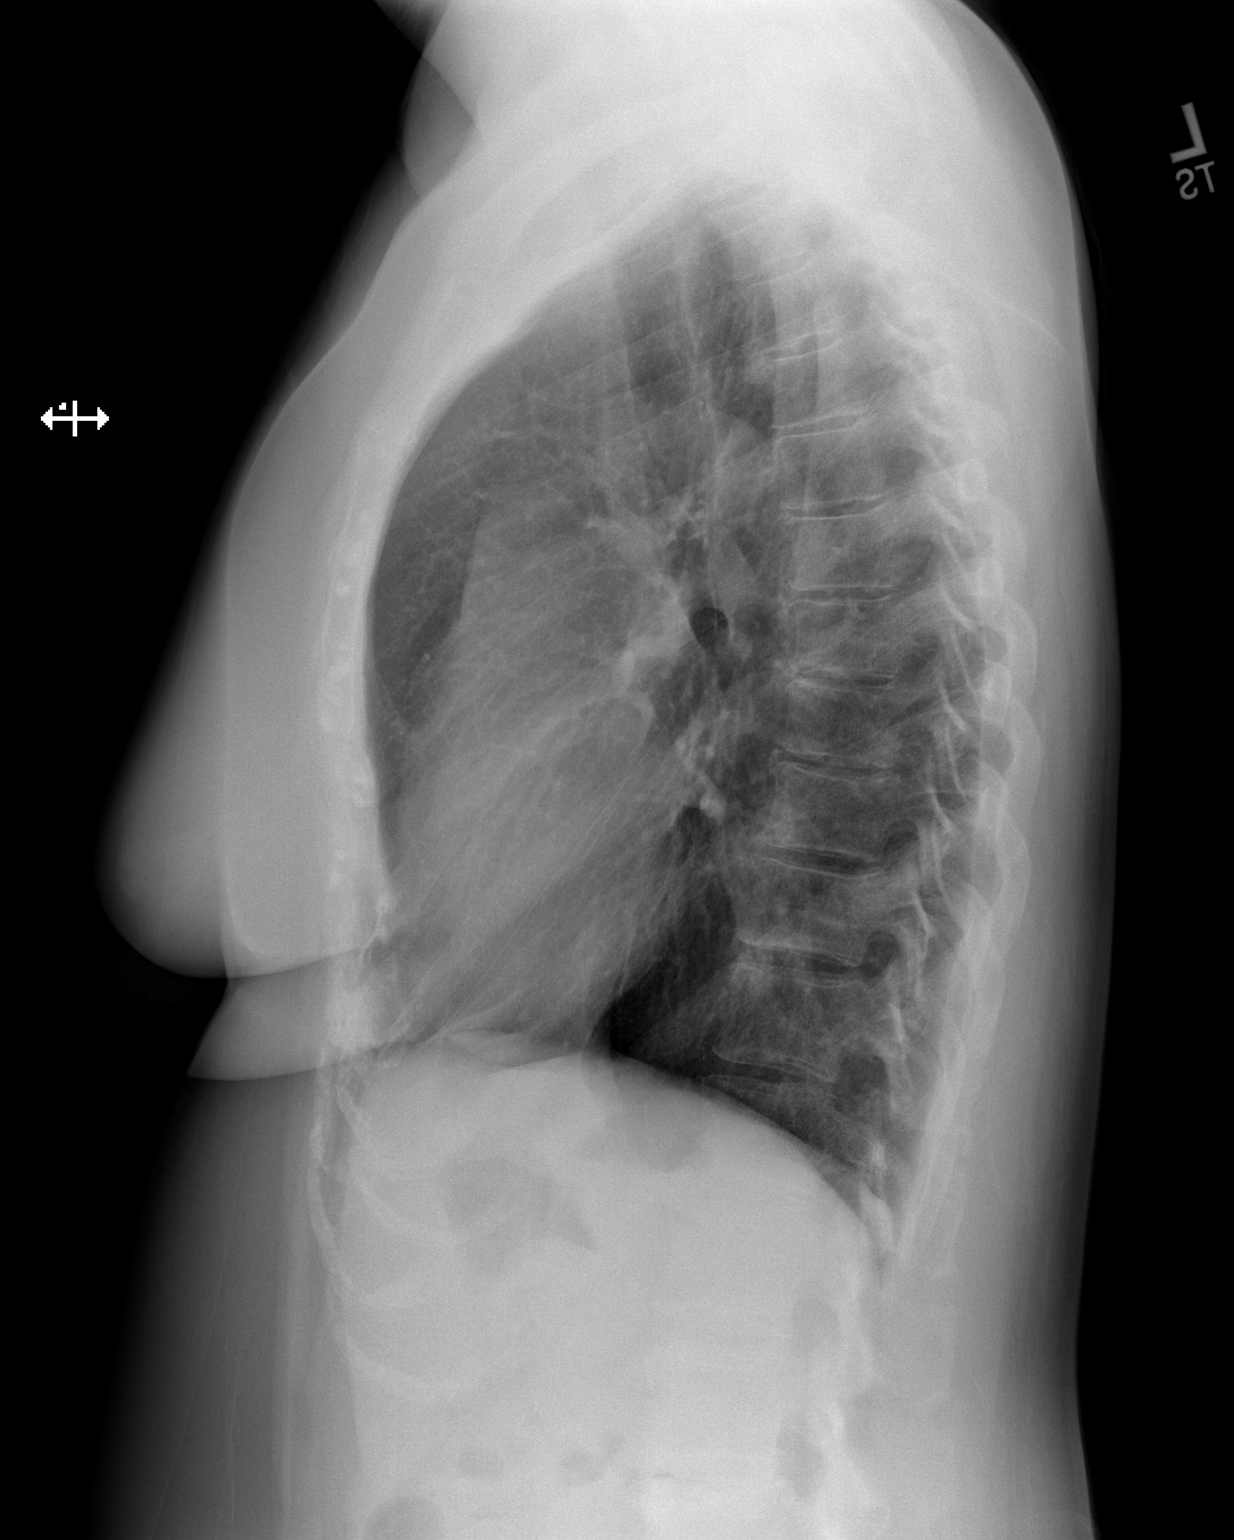

[2 of 2 positions shown; findings below may reference images not displayed]

FINDINGS: The heart size and mediastinal contours are normal. The lungs are
clear. There is no pleural effusion or pneumothorax. No acute
osseous findings are identified. Degenerative changes are noted
within the lumbar spine. The subacromial space of both shoulders
appears mildly narrowed suggesting rotator cuff impingement.
IMPRESSION: No active cardiopulmonary process.

## 2015-12-07 ENCOUNTER — Other Ambulatory Visit: Payer: Self-pay | Admitting: Orthopedic Surgery

## 2015-12-07 DIAGNOSIS — M25512 Pain in left shoulder: Secondary | ICD-10-CM

## 2016-01-03 ENCOUNTER — Ambulatory Visit
Admission: RE | Admit: 2016-01-03 | Discharge: 2016-01-03 | Disposition: A | Discharge status: Home / Self Care | Payer: Medicare Other | Source: Ambulatory Visit | Attending: Orthopedic Surgery | Admitting: Orthopedic Surgery

## 2016-01-03 DIAGNOSIS — M25512 Pain in left shoulder: Secondary | ICD-10-CM

## 2016-01-18 ENCOUNTER — Encounter: Payer: Self-pay | Admitting: Gastroenterology

## 2016-01-18 ENCOUNTER — Ambulatory Visit (INDEPENDENT_AMBULATORY_CARE_PROVIDER_SITE_OTHER): Payer: Medicare Other | Admitting: Gastroenterology

## 2016-01-18 VITALS — BP 126/76 | HR 77 | Temp 98.1°F | Ht 62.0 in | Wt 165.6 lb

## 2016-01-18 DIAGNOSIS — R112 Nausea with vomiting, unspecified: Secondary | ICD-10-CM | POA: Insufficient documentation

## 2016-01-18 DIAGNOSIS — R1011 Right upper quadrant pain: Secondary | ICD-10-CM | POA: Diagnosis not present

## 2016-01-18 DIAGNOSIS — R197 Diarrhea, unspecified: Secondary | ICD-10-CM | POA: Diagnosis not present

## 2016-01-18 HISTORY — DX: Nausea with vomiting, unspecified: R11.2

## 2016-01-18 NOTE — Patient Instructions (Signed)
1. We will obtain copy of recent labs and xray and make further recommendations.

## 2016-01-18 NOTE — Progress Notes (Signed)
Primary Care Physician: [name], MD  Primary Gastroenterologist:  Barney Drain, MD   Chief Complaint  Patient presents with  . Abdominal Pain    HPI: Victoria Molina is a 70 y.o. female here for follow up of ongoing RUQ pain. She was last seen in 09/2015. She has chronic RUQ pain associated with N/V. History of HIDA scan with EF of 91.4% but reproduction of symptoms with CCK (02/2014). Saw Dr. Arnoldo Morale but patient wanted to wait on elective cholecystectomy at that time. CT with pancreatic protocol due to weight loss and persistent pain without evidence of malignancy (09/2014). EGD with gastritis 2015. Barium pill esophagram with nonspecific esophageal motility disorder in March 2016. Seen at [facility] April 2016 due to dyspepsia started on Remeron, which did not help. Advised decreased Protonix once daily at that time. Last seen at [facility] in September. Chronic right upper quadrant pain felt to be related to costochondritis. Provided 14 day course of mobic.  Patient notes that she has had this pain for 20 years. She states that she doesn't believe it is musculoskeletal. It wakes her up most nights around 3am. When she wakes up she is always on her back. Gets better if she lays on her side. When it is bad pain, she has N/V. Notes sometimes pain with meals. Pain goes around the flank and into the back. Recently felt like her ruq pain has been worse. Had N/V/D and thought it was a virus. Her niece was sick too. Her symptoms lasted for 7 days though where her niece was sick only a couple. Patient continue to have couple of loose stools daily, usually constipation predominant. Still with some luq pain but mild. Continues to have bitter taste in the back of her mouth.   Recently had panic attack in an MRI machine for her shoulder. N/V started and has been worse ever since. No recent abx. She was seen recently by PCP and cardiologist at [facility]. Plans for stress test coming up. She  was so sick they sent her to urgent care clinic where she had labs and xrays.      Current Outpatient Prescriptions  Medication Sig Dispense Refill  . amLODipine (NORVASC) 5 MG tablet Take 5 mg by mouth daily.    . clorazepate (TRANXENE) 7.5 MG tablet Take 7.5 mg by mouth 2 (two) times daily as needed for anxiety.    . Cyanocobalamin (VITAMIN B 12 PO) Take 1,500 mcg by mouth daily.    . furosemide (LASIX) 20 MG tablet Take 20 mg by mouth as needed.     . isosorbide dinitrate (ISORDIL) 10 MG tablet Take 10 mg by mouth 3 (three) times daily.    . Misc Natural Products (JOINT SUPPORT PO) Take 1 tablet by mouth daily.    . Multiple Vitamins-Minerals (MULTIVITAMIN WITH MINERALS) tablet Take 1 tablet by mouth daily.    Marland Kitchen omega-3 acid ethyl esters (LOVAZA) 1 G capsule Take 1 g by mouth daily.    Marland Kitchen omeprazole (PRILOSEC) 20 MG capsule Take 1 capsule (20 mg total) by mouth 2 (two) times daily before a meal. 60 capsule 0  . potassium chloride (K-DUR,KLOR-CON) 10 MEQ tablet Take 10 mEq by mouth as needed.     . Vitamin D, Cholecalciferol, 1000 UNITS TABS Take 2,000 Units by mouth daily.    . methylcellulose (ARTIFICIAL TEARS) 1 % ophthalmic solution Place 1 drop into both eyes as needed (dry eyes). Reported on 01/18/2016  No current facility-administered medications for this visit.    Allergies as of 01/18/2016 - Review Complete 01/18/2016  Allergen Reaction Noted  . Percocet [oxycodone-acetaminophen]  06/01/2014  . Iodinated diagnostic agents Hives and Itching 12/27/2010  . Sulfa antibiotics Hives and Itching 12/27/2010  . Sulfa antibiotics Hives 05/13/2014  . Contrast media [iodinated diagnostic agents] Rash 05/13/2014  . Vioxx [rofecoxib] Swelling 05/08/2012    ROS:  General: no fever. See hpi ENT: Negative for hoarseness, difficulty swallowing , nasal congestion. CV: Negative for chest pain, angina, palpitations, dyspnea on exertion, peripheral edema.  Respiratory: Negative for dyspnea  at rest, dyspnea on exertion, cough, sputum, wheezing.  GI: See history of present illness. GU:  Negative for dysuria, hematuria, urinary incontinence, urinary frequency, nocturnal urination.  Endo: Negative for unusual weight change.    Physical Examination:   BP 126/76 mmHg  Pulse 77  Temp(Src) 98.1 F (36.7 C) (Oral)  Ht 5\' 2"  (1.575 m)  Wt 165 lb 9.6 oz (75.116 kg)  BMI 30.28 kg/m2  General: Well-nourished, well-developed in no acute distress.  Eyes: No icterus. Mouth: Oropharyngeal mucosa moist and pink , no lesions erythema or exudate. Lungs: Clear to auscultation bilaterally.  Heart: Regular rate and rhythm, no murmurs rubs or gallops.  Abdomen: Bowel sounds are normal, nontender, nondistended, no hepatosplenomegaly or masses, no abdominal bruits or hernia , no rebound or guarding.   Extremities: No lower extremity edema. No clubbing or deformities. Neuro: Alert and oriented x 4   Skin: Warm and dry, no jaundice.   Psych: Alert and cooperative, normal mood and affect.  Labs:  Have requested.    Imaging Studies: No results found.

## 2016-01-18 NOTE — Assessment & Plan Note (Signed)
Ongoing chronic ruq pain associated with intermittent N/V. Recently with diarrhea after likely viral gastroenteritis. She has had extensive work up over the past two years as previously outlined. She reports pain persistent for 20 years. She has seen numerous providers. Consensus has been that her pain is musculoskeletal in nature such as ribs, abdominal wall. She develops vomiting with intense pain. Most recent imaging in September 2016, renal ultrasound with no evidence of stones.  I will retrieve labs and recent x-rays done at outside facility. Further recommendations to follow. Patient voices concern that this is not musculoskeletal and she is still concerned about her gallbladder.

## 2016-01-19 NOTE — Progress Notes (Signed)
CC'D TO PCP °

## 2016-01-31 ENCOUNTER — Ambulatory Visit: Payer: Medicare Other

## 2016-01-31 ENCOUNTER — Ambulatory Visit: Admission: RE | Admit: 2016-01-31 | Payer: Medicare Other | Source: Ambulatory Visit

## 2016-02-15 ENCOUNTER — Ambulatory Visit: Admission: RE | Admit: 2016-02-15 | Payer: Medicare Other | Source: Ambulatory Visit

## 2016-02-15 ENCOUNTER — Other Ambulatory Visit: Payer: Self-pay | Admitting: Orthopedic Surgery

## 2016-02-15 ENCOUNTER — Ambulatory Visit
Admission: RE | Admit: 2016-02-15 | Discharge: 2016-02-15 | Disposition: A | Discharge status: Home / Self Care | Payer: Medicare Other | Source: Ambulatory Visit | Attending: Orthopedic Surgery | Admitting: Orthopedic Surgery

## 2016-02-15 DIAGNOSIS — M25512 Pain in left shoulder: Secondary | ICD-10-CM

## 2016-03-02 NOTE — Progress Notes (Signed)
Reviewed labs from March 2017 from outside source. White blood cell count 7200, hemoglobin 13.6, hematocrit 39.9, MCV 87.9, platelets 329,000, glucose 98, BUN 13, creatinine 0.6, total bilirubin 0.4, alkaline phosphatase 91, AST 18, ALT 9, albumin 4.2, plain abdominal x-ray on 01/11/2016 with unremarkable nonobstructive bowel gas pattern. Possible right nephrolithiasis versus high density material within the overlying colonic fecal stream.  Her labs were unremarkable. I would like for her to see [facility] next month. Please make appt.

## 2016-03-03 ENCOUNTER — Other Ambulatory Visit: Payer: Self-pay | Admitting: Gastroenterology

## 2016-03-05 ENCOUNTER — Encounter: Payer: Self-pay | Admitting: Gastroenterology

## 2016-03-06 ENCOUNTER — Encounter: Payer: Self-pay | Admitting: Gastroenterology

## 2016-03-06 NOTE — Progress Notes (Signed)
APPT MADE AND LETTER SENT  °

## 2016-03-06 NOTE — Progress Notes (Signed)
Pt is aware of results and OK to schedule appt.

## 2016-04-04 ENCOUNTER — Ambulatory Visit: Payer: Medicare Other | Admitting: Gastroenterology

## 2016-04-11 ENCOUNTER — Ambulatory Visit: Payer: Medicare Other | Admitting: Gastroenterology

## 2016-05-30 ENCOUNTER — Other Ambulatory Visit: Payer: Self-pay | Admitting: Nurse Practitioner

## 2016-06-25 ENCOUNTER — Other Ambulatory Visit: Payer: Self-pay | Admitting: Nurse Practitioner

## 2016-06-27 ENCOUNTER — Other Ambulatory Visit: Payer: Self-pay

## 2016-06-27 ENCOUNTER — Encounter: Payer: Self-pay | Admitting: Gastroenterology

## 2016-06-27 ENCOUNTER — Other Ambulatory Visit: Payer: Self-pay | Admitting: Gastroenterology

## 2016-06-27 ENCOUNTER — Ambulatory Visit (INDEPENDENT_AMBULATORY_CARE_PROVIDER_SITE_OTHER): Payer: Medicare Other | Admitting: Gastroenterology

## 2016-06-27 DIAGNOSIS — K5901 Slow transit constipation: Secondary | ICD-10-CM | POA: Diagnosis not present

## 2016-06-27 DIAGNOSIS — R1310 Dysphagia, unspecified: Secondary | ICD-10-CM

## 2016-06-27 DIAGNOSIS — K635 Polyp of colon: Secondary | ICD-10-CM

## 2016-06-27 DIAGNOSIS — K219 Gastro-esophageal reflux disease without esophagitis: Secondary | ICD-10-CM | POA: Diagnosis not present

## 2016-06-27 DIAGNOSIS — R1011 Right upper quadrant pain: Secondary | ICD-10-CM

## 2016-06-27 NOTE — Assessment & Plan Note (Signed)
SYMPTOMS FAIRLY WELL CONTROLLED.  CONTINUE TO MONITOR SYMPTOMS. 

## 2016-06-27 NOTE — Assessment & Plan Note (Signed)
SYMPTOMS CONTROLLED/RESOLVED.  CONTINUE TO MONITOR SYMPTOMS. 

## 2016-06-27 NOTE — Progress Notes (Signed)
On recall  °

## 2016-06-27 NOTE — Patient Instructions (Addendum)
FOLLOW A LOW FAT DIET. MEATS SHOULD BE BAKED, BROILED, OR BOILED. AVOID FRIED FOOD. SEE INFO BELOW.  GO TO ED FOR PERSISTENT RIGHT UPPER ABDOMINAL PAIN , FEVER, CHILLS, AND NAUSEA/VOMITING.  SCHEDULE AN APPOINTMENT WITH DR. Arnoldo Morale TO DISCUSS BENEFITS V. RISKS OF REMOVING YOUR GALLBLADDER.  Use Prilosec 30 minutes mealS ONCE OR TWICE DAILY.  PLEASE CALL WITH QUESTIONS OR CONCERNS.  FOLLOW UP IN 6 MOS. MERRY CHRISTMAS AND HAPPY NEW YEAR!  Low-Fat Diet BREADS, CEREALS, PASTA, RICE, DRIED PEAS, AND BEANS These products are high in carbohydrates and most are low in fat. Therefore, they can be increased in the diet as substitutes for fatty foods. They too, however, contain calories and should not be eaten in excess. Cereals can be eaten for snacks as well as for breakfast.   FRUITS AND VEGETABLES It is good to eat fruits and vegetables. Besides being sources of fiber, both are rich in vitamins and some minerals. They help you get the daily allowances of these nutrients. Fruits and vegetables can be used for snacks and desserts.  MEATS Limit lean meat, chicken, [place], and fish to no more than 6 ounces per day. Beef, Pork, and Lamb Use lean cuts of beef, pork, and lamb. Lean cuts include:  Extra-lean ground beef.  Arm roast.  Sirloin tip.  Center-cut ham.  Round steak.  Loin chops.  Rump roast.  Tenderloin.  Trim all fat off the outside of meats before cooking. It is not necessary to severely decrease the intake of red meat, but lean choices should be made. Lean meat is rich in protein and contains a highly absorbable form of iron. Premenopausal women, in particular, should avoid reducing lean red meat because this could increase the risk for low red blood cells (iron-deficiency anemia).  Chicken and [place] These are good sources of protein. The fat of poultry can be reduced by removing the skin and underlying fat layers before cooking. Chicken and [place] can be substituted for lean red  meat in the diet. Poultry should not be fried or covered with high-fat sauces. Fish and Shellfish Fish is a good source of protein. Shellfish contain cholesterol, but they usually are low in saturated fatty acids. The preparation of fish is important. Like chicken and [place], they should not be fried or covered with high-fat sauces. EGGS Egg whites contain no fat or cholesterol. They can be eaten often. Try 1 to 2 egg whites instead of whole eggs in recipes or use egg substitutes that do not contain yolk. MILK AND DAIRY PRODUCTS Use skim or 1% milk instead of 2% or whole milk. Decrease whole milk, natural, and processed cheeses. Use nonfat or low-fat (2%) cottage cheese or low-fat cheeses made from vegetable oils. Choose nonfat or low-fat (1 to 2%) yogurt. Experiment with evaporated skim milk in recipes that call for heavy cream. Substitute low-fat yogurt or low-fat cottage cheese for sour cream in dips and salad dressings. Have at least 2 servings of low-fat dairy products, such as 2 glasses of skim (or 1%) milk each day to help get your daily calcium intake. FATS AND OILS Reduce the total intake of fats, especially saturated fat. Butterfat, lard, and beef fats are high in saturated fat and cholesterol. These should be avoided as much as possible. Vegetable fats do not contain cholesterol, but certain vegetable fats, such as coconut oil, palm oil, and palm kernel oil are very high in saturated fats. These should be limited. These fats are often used in [employer],  processed foods, popcorn, oils, and nondairy creamers. Vegetable shortenings and some peanut butters contain hydrogenated oils, which are also saturated fats. Read the labels on these foods and check for saturated vegetable oils. Unsaturated vegetable oils and fats do not raise blood cholesterol. However, they should be limited because they are fats and are high in calories. Total fat should still be limited to 30% of your daily caloric intake.  Desirable liquid vegetable oils are corn oil, cottonseed oil, olive oil, canola oil, safflower oil, soybean oil, and sunflower oil. Peanut oil is not as good, but small amounts are acceptable. Buy a heart-healthy tub margarine that has no partially hydrogenated oils in the ingredients. Mayonnaise and salad dressings often are made from unsaturated fats, but they should also be limited because of their high calorie and fat content. Seeds, nuts, peanut butter, olives, and avocados are high in fat, but the fat is mainly the unsaturated type. These foods should be limited mainly to avoid excess calories and fat. OTHER EATING TIPS Snacks  Most sweets should be limited as snacks. They tend to be rich in calories and fats, and their caloric content outweighs their nutritional value. Some good choices in snacks are graham crackers, melba toast, soda crackers, bagels (no egg), English muffins, fruits, and vegetables. These snacks are preferable to snack crackers, [place] fries, TORTILLA CHIPS, and POTATO chips. Popcorn should be air-popped or cooked in small amounts of liquid vegetable oil. Desserts Eat fruit, low-fat yogurt, and fruit ices instead of pastries, cake, and cookies. Sherbet, angel food cake, gelatin dessert, frozen low-fat yogurt, or other frozen products that do not contain saturated fat (pure fruit juice bars, frozen ice pops) are also acceptable.  COOKING METHODS Choose those methods that use little or no fat. They include: Poaching.  Braising.  Steaming.  Grilling.  Baking.  Stir-frying.  Broiling.  Microwaving.  Foods can be cooked in a nonstick pan without added fat, or use a nonfat cooking spray in regular cookware. Limit fried foods and avoid frying in saturated fat. Add moisture to lean meats by using water, broth, cooking wines, and other nonfat or low-fat sauces along with the cooking methods mentioned above. Soups and stews should be chilled after cooking. The fat that forms on top  after a few hours in the refrigerator should be skimmed off. When preparing meals, avoid using excess salt. Salt can contribute to raising blood pressure in some people.  EATING AWAY FROM HOME Order entres, potatoes, and vegetables without sauces or butter. When meat exceeds the size of a deck of cards (3 to 4 ounces), the rest can be taken home for another meal. Choose vegetable or fruit salads and ask for low-calorie salad dressings to be served on the side. Use dressings sparingly. Limit high-fat toppings, such as bacon, crumbled eggs, cheese, sunflower seeds, and olives. Ask for heart-healthy tub margarine instead of butter.
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
## 2016-06-27 NOTE — Progress Notes (Signed)
cc'ed to pcp °

## 2016-06-27 NOTE — Assessment & Plan Note (Signed)
SYMPTOMS FAIRLY WELL CONTROLLED ON PRILOSEC DAILY.  Use Prilosec 30 minutes mealS ONCE OR TWICE DAILY. FOLLOW UP IN 6 MOS.

## 2016-06-27 NOTE — Assessment & Plan Note (Signed)
UP TO DATE. NO WARNING SIGNS/SYMPTOMS   CONTINUE TO MONITOR SYMPTOMS.

## 2016-06-27 NOTE — Assessment & Plan Note (Addendum)
SYMPTOMS NOT IDEALLY CONTROLLED & HAVE EVOLVED INTO WHAT APPEARS TO BE BILIARY COLIC: AFTER EATING, ASSOCIATED WITH NAUSEA, NOCTURNAL SYMPTOMS, AND RADIATION GTO RIGHT SHOULDER BLADE. LAST US/HIDA 2015-WNLs BUT SX REPRODUCED WITH CCK.  COMPLETE LABS. FOLLOW A LOW FAT DIET. MEATS SHOULD BE BAKED, BROILED, OR BOILED. AVOID FRIED FOOD.  HANDOUT GIVEN. GO TO ED FOR PERSISTENT RIGHT UPPER ABDOMINAL PAIN , FEVER, CHILLS, AND NAUSEA/VOMITING. SCHEDULE AN APPOINTMENT WITH DR. Arnoldo Morale TO DISCUSS BENEFITS V. RISKS OF REMOVING YOUR GALLBLADDER. FOLLOW UP IN 6 MOS. MERRY CHRISTMAS AND HAPPY NEW YEAR!  ADDENDUM: 1455 PT DECLINED TO HAVE HIDA DONE BECAUSE SHE SAYS THEY NEVER SHOW UP ANYTHING. PRIOR HIDA: 2009, 2013, 2015.

## 2016-06-28 LAB — COMPLETE METABOLIC PANEL WITH GFR
ALT: 11 U/L (ref 6–29)
AST: 21 U/L (ref 10–35)
Albumin: 4.1 g/dL (ref 3.6–5.1)
Alkaline Phosphatase: 90 U/L (ref 33–130)
BUN: 17 mg/dL (ref 7–25)
CO2: 28 mmol/L (ref 20–31)
Calcium: 9.5 mg/dL (ref 8.6–10.4)
Chloride: 107 mmol/L (ref 98–110)
Creat: 0.81 mg/dL (ref 0.60–0.93)
GFR, Est African American: 84 mL/min (ref >=60)
GFR, Est Non African American: 73 mL/min (ref >=60)
Glucose, Bld: 93 mg/dL (ref 65–99)
Potassium: 3.7 mmol/L (ref 3.5–5.3)
Sodium: 141 mmol/L (ref 135–146)
Total Bilirubin: 0.4 mg/dL (ref 0.2–1.2)
Total Protein: 6.8 g/dL (ref 6.1–8.1)

## 2016-07-11 ENCOUNTER — Other Ambulatory Visit: Payer: Self-pay

## 2016-07-11 ENCOUNTER — Telehealth: Payer: Self-pay | Admitting: Gastroenterology

## 2016-07-11 DIAGNOSIS — R1011 Right upper quadrant pain: Secondary | ICD-10-CM

## 2016-07-11 NOTE — Telephone Encounter (Signed)
Pt advise

## 2016-07-11 NOTE — Telephone Encounter (Signed)
PT NEEDS HIDA W/ CCK, Dx: POSTPRANDIAL RUQ ABDOMINAL PAIN.

## 2016-07-11 NOTE — Telephone Encounter (Signed)
Patient called stating that she should have the test that Dr. Oneida Alar recommended she have.  She refused to have it in the past and now thinks she needs to have it done.

## 2016-07-11 NOTE — Telephone Encounter (Signed)
Pt is set up for HIDA on 07/16/16 @ 10:00. She is aware

## 2016-07-16 ENCOUNTER — Encounter (HOSPITAL_COMMUNITY): Payer: Medicare Other

## 2016-07-17 ENCOUNTER — Encounter (HOSPITAL_COMMUNITY)
Admission: RE | Admit: 2016-07-17 | Discharge: 2016-07-17 | Disposition: A | Discharge status: Home / Self Care | Payer: Medicare Other | Source: Ambulatory Visit | Attending: Gastroenterology | Admitting: Gastroenterology

## 2016-07-17 ENCOUNTER — Encounter (HOSPITAL_COMMUNITY): Payer: Self-pay

## 2016-07-17 DIAGNOSIS — R1011 Right upper quadrant pain: Secondary | ICD-10-CM | POA: Diagnosis not present

## 2016-07-17 MED ORDER — TECHNETIUM TC 99M MEBROFENIN IV KIT
5.0000 | PACK | Freq: Once | INTRAVENOUS | Status: AC | PRN
  Administered 2016-07-17: 5 via INTRAVENOUS

## 2016-08-01 ENCOUNTER — Telehealth: Payer: Self-pay | Admitting: Gastroenterology

## 2016-08-01 NOTE — Telephone Encounter (Signed)
PLEASE CALL PT. HER HIDA SCAN SHOWS A LOW NORMAL GB EJECTION FRACTION. YOUR GB EJECTION FRACTION IS HALF AS GOOD AS IT WAS. SEE SURGERY TO DISCUSS BENEFITS V. RISKS OF GB SURGERY.

## 2016-08-02 ENCOUNTER — Telehealth: Payer: Self-pay

## 2016-08-02 ENCOUNTER — Other Ambulatory Visit: Payer: Self-pay

## 2016-08-02 DIAGNOSIS — R1011 Right upper quadrant pain: Secondary | ICD-10-CM

## 2016-08-02 NOTE — Telephone Encounter (Signed)
Pt called back and said to go ahead and refer her to Dr.Jenkins and she will see what he recommends.

## 2016-08-02 NOTE — Telephone Encounter (Signed)
Pt is aware. She said she wants to think about it and will call back for referral to Dr.Jenkins.

## 2016-08-02 NOTE — Telephone Encounter (Signed)
Referral info faxed to Dr. Arnoldo Morale.

## 2016-08-02 NOTE — Telephone Encounter (Signed)
LMOM and VM asked pt to call office to be informed of appt with the surgeon (Dr. Arnoldo Morale). Appt was made for 08/16/16 at 10:00 am.

## 2016-08-03 ENCOUNTER — Telehealth: Payer: Self-pay | Admitting: Gastroenterology

## 2016-08-03 NOTE — Telephone Encounter (Signed)
Noted  

## 2016-08-03 NOTE — Telephone Encounter (Signed)
Patient called and she is aware of OV with Dr Arnoldo Morale on 10/26 at 10am

## 2016-12-13 ENCOUNTER — Other Ambulatory Visit: Payer: Self-pay | Admitting: Nurse Practitioner

## 2016-12-20 ENCOUNTER — Encounter: Payer: Self-pay | Admitting: Gastroenterology

## 2016-12-20 ENCOUNTER — Ambulatory Visit (INDEPENDENT_AMBULATORY_CARE_PROVIDER_SITE_OTHER): Payer: Medicare Other | Admitting: Gastroenterology

## 2016-12-20 DIAGNOSIS — R1319 Other dysphagia: Secondary | ICD-10-CM

## 2016-12-20 DIAGNOSIS — R1011 Right upper quadrant pain: Secondary | ICD-10-CM

## 2016-12-20 DIAGNOSIS — K219 Gastro-esophageal reflux disease without esophagitis: Secondary | ICD-10-CM

## 2016-12-20 DIAGNOSIS — K5904 Chronic idiopathic constipation: Secondary | ICD-10-CM | POA: Diagnosis not present

## 2016-12-20 DIAGNOSIS — R1310 Dysphagia, unspecified: Secondary | ICD-10-CM | POA: Diagnosis not present

## 2016-12-20 MED ORDER — OMEPRAZOLE 20 MG PO CPDR: DELAYED_RELEASE_CAPSULE | ORAL | 3 refills | Status: DC
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
## 2016-12-20 NOTE — Assessment & Plan Note (Signed)
AWAITING GB REMOVAL AFTER BOWLING LEAGUE SEASON ENDS.  CONTINUE TO MONITOR SYMPTOMS.

## 2016-12-20 NOTE — Assessment & Plan Note (Signed)
SYMPTOMS CONTROLLED/RESOLVED.  CONTINUE TO MONITOR SYMPTOMS. 

## 2016-12-20 NOTE — Patient Instructions (Addendum)
CONTINUE YOUR WEIGHT LOSS EFFORTS. STOP DRINKING SWEET TEA.  DRINK WATER TO KEEP YOUR URINE LIGHT YELLOW.  FOLLOW A LOW FAT DIET. MEATS SHOULD BE BAKED, BROILED, OR BOILED. AVOID FRIED FOODS.  Use OMEPRAZOLE 30 minutes prior to your first meal.  FOLLOW UP IN 6 MOS.

## 2016-12-20 NOTE — Assessment & Plan Note (Signed)
SYMPTOMS FAIRLY WELL CONTROLLED.   AVOID SWEET TEA LOSE WEIGHT DRINK WATER Use Prilosec 30 minutes prior to your first meal. FOLLOW UP IN 6 MOS.

## 2016-12-20 NOTE — Assessment & Plan Note (Signed)
SYMPTOMS FAIRLY WELL CONTROLLED.  CONTINUE MIRALAX TO AVOID CONSTIPATION.

## 2016-12-21 NOTE — Progress Notes (Signed)
ON RECALL  °

## 2016-12-24 NOTE — Progress Notes (Signed)
CC'ED TO PCP 

## 2017-02-20 ENCOUNTER — Encounter: Payer: Self-pay | Admitting: Emergency Medicine

## 2017-02-20 ENCOUNTER — Emergency Department: Payer: No Typology Code available for payment source

## 2017-02-20 ENCOUNTER — Emergency Department
Admission: EM | Admit: 2017-02-20 | Discharge: 2017-02-20 | Disposition: A | Discharge status: Home / Self Care | Payer: No Typology Code available for payment source | Attending: Emergency Medicine | Admitting: Emergency Medicine

## 2017-02-20 DIAGNOSIS — Y999 Unspecified external cause status: Secondary | ICD-10-CM | POA: Diagnosis not present

## 2017-02-20 DIAGNOSIS — Y92410 Unspecified street and highway as the place of occurrence of the external cause: Secondary | ICD-10-CM | POA: Diagnosis not present

## 2017-02-20 DIAGNOSIS — Z87891 Personal history of nicotine dependence: Secondary | ICD-10-CM | POA: Insufficient documentation

## 2017-02-20 DIAGNOSIS — Z79899 Other long term (current) drug therapy: Secondary | ICD-10-CM | POA: Diagnosis not present

## 2017-02-20 DIAGNOSIS — S4992XA Unspecified injury of left shoulder and upper arm, initial encounter: Secondary | ICD-10-CM | POA: Diagnosis present

## 2017-02-20 DIAGNOSIS — S46912A Strain of unspecified muscle, fascia and tendon at shoulder and upper arm level, left arm, initial encounter: Secondary | ICD-10-CM | POA: Insufficient documentation

## 2017-02-20 DIAGNOSIS — S7002XA Contusion of left hip, initial encounter: Secondary | ICD-10-CM | POA: Insufficient documentation

## 2017-02-20 DIAGNOSIS — I10 Essential (primary) hypertension: Secondary | ICD-10-CM | POA: Diagnosis not present

## 2017-02-20 DIAGNOSIS — Y9389 Activity, other specified: Secondary | ICD-10-CM | POA: Diagnosis not present

## 2017-02-20 MED ORDER — CYCLOBENZAPRINE HCL 5 MG PO TABS: 10.0000 mg | ORAL_TABLET | Freq: Three times a day (TID) | ORAL | 0 refills | Status: DC | PRN

## 2017-02-20 MED ORDER — MELOXICAM 7.5 MG PO TABS: 7.5000 mg | ORAL_TABLET | Freq: Every day | ORAL | 0 refills | Status: AC
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
## 2017-02-26 ENCOUNTER — Ambulatory Visit: Payer: Medicare Other | Admitting: Dietician

## 2017-03-07 ENCOUNTER — Encounter: Payer: Self-pay | Admitting: Dietician

## 2017-03-07 ENCOUNTER — Encounter: Payer: Medicare Other | Attending: Internal Medicine | Admitting: Dietician

## 2017-03-07 VITALS — Ht 62.0 in | Wt 178.0 lb

## 2017-03-07 DIAGNOSIS — K219 Gastro-esophageal reflux disease without esophagitis: Secondary | ICD-10-CM | POA: Insufficient documentation

## 2017-03-07 DIAGNOSIS — I119 Hypertensive heart disease without heart failure: Secondary | ICD-10-CM | POA: Insufficient documentation

## 2017-03-07 DIAGNOSIS — Z713 Dietary counseling and surveillance: Secondary | ICD-10-CM | POA: Diagnosis present

## 2017-03-07 DIAGNOSIS — E782 Mixed hyperlipidemia: Secondary | ICD-10-CM | POA: Diagnosis not present

## 2017-03-07 DIAGNOSIS — E663 Overweight: Secondary | ICD-10-CM | POA: Insufficient documentation

## 2017-03-07 DIAGNOSIS — Z8711 Personal history of peptic ulcer disease: Secondary | ICD-10-CM | POA: Insufficient documentation

## 2017-03-07 DIAGNOSIS — I10 Essential (primary) hypertension: Secondary | ICD-10-CM

## 2017-03-07 DIAGNOSIS — I519 Heart disease, unspecified: Secondary | ICD-10-CM | POA: Diagnosis not present

## 2017-03-07 NOTE — Patient Instructions (Signed)
Balance meals with 1-3 oz protein, 1-2 servings of carbohydrate (starch, milk, fruit, small serving of sweets)  and "free" vegetables. Add fruit to as many meals as possible. Include at least 6 oz protein foods per day. Include 6-8 cups of water daily. Continue to exercise 30 minutes in the morning. Add 15-20 minutes in afternoon or evening; exercises that do not aggravate shoulder pain.
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
## 2017-04-17 ENCOUNTER — Encounter: Payer: Self-pay | Admitting: Gastroenterology

## 2017-05-01 ENCOUNTER — Other Ambulatory Visit: Payer: Self-pay | Admitting: Neurosurgery

## 2017-05-01 DIAGNOSIS — M545 Low back pain: Secondary | ICD-10-CM

## 2017-05-07 ENCOUNTER — Other Ambulatory Visit: Payer: Self-pay | Admitting: Orthopedic Surgery

## 2017-05-07 DIAGNOSIS — M25511 Pain in right shoulder: Secondary | ICD-10-CM

## 2017-05-15 ENCOUNTER — Telehealth: Payer: Self-pay | Admitting: Gastroenterology

## 2017-05-15 MED ORDER — OMEPRAZOLE 20 MG PO CPDR
DELAYED_RELEASE_CAPSULE | ORAL | 3 refills | Status: DC
Start: 2017-05-15 — End: 2018-09-29

## 2017-05-15 NOTE — Telephone Encounter (Signed)
Done

## 2017-05-15 NOTE — Telephone Encounter (Signed)
PT said she is wanting refills for 90 days instead of 30 at the time. I see that she has one on profile so I called her pharmacy and spoke to Dominica Severin, Software engineer. He said pt has been calling in refills from an old bottle and for her to call and speak to a person and let them know that she has a RX on profile for 90 days. Pt is aware.

## 2017-05-15 NOTE — Addendum Note (Signed)
Addended by: Annitta Needs on: 05/15/2017 04:10 PM   Modules accepted: Orders

## 2017-05-15 NOTE — Telephone Encounter (Signed)
[phone] patient has questions about her omeprazole being called into her pharmacy

## 2017-05-17 ENCOUNTER — Ambulatory Visit
Admission: RE | Admit: 2017-05-17 | Discharge: 2017-05-17 | Disposition: A | Discharge status: Home / Self Care | Payer: Medicare Other | Source: Ambulatory Visit | Attending: Neurosurgery | Admitting: Neurosurgery

## 2017-05-17 DIAGNOSIS — M545 Low back pain: Secondary | ICD-10-CM

## 2017-05-17 MED ORDER — GADOBENATE DIMEGLUMINE 529 MG/ML IV SOLN
20.0000 mL | Freq: Once | INTRAVENOUS | Status: AC | PRN
  Administered 2017-05-17: 16 mL via INTRAVENOUS

## 2017-05-21 ENCOUNTER — Ambulatory Visit: Payer: Medicare Other

## 2017-05-23 ENCOUNTER — Ambulatory Visit
Admission: RE | Admit: 2017-05-23 | Discharge: 2017-05-23 | Disposition: A | Discharge status: Home / Self Care | Payer: Medicare Other | Source: Ambulatory Visit | Attending: Orthopedic Surgery | Admitting: Orthopedic Surgery

## 2017-05-23 DIAGNOSIS — M19011 Primary osteoarthritis, right shoulder: Secondary | ICD-10-CM | POA: Diagnosis not present

## 2017-05-23 DIAGNOSIS — M25511 Pain in right shoulder: Secondary | ICD-10-CM | POA: Insufficient documentation

## 2017-05-23 DIAGNOSIS — M75111 Incomplete rotator cuff tear or rupture of right shoulder, not specified as traumatic: Secondary | ICD-10-CM | POA: Diagnosis not present

## 2017-07-03 ENCOUNTER — Ambulatory Visit: Payer: Medicare Other | Admitting: Gastroenterology

## 2017-07-04 ENCOUNTER — Encounter: Payer: Self-pay | Admitting: Gastroenterology

## 2017-07-04 ENCOUNTER — Ambulatory Visit (INDEPENDENT_AMBULATORY_CARE_PROVIDER_SITE_OTHER): Payer: Medicare Other | Admitting: Gastroenterology

## 2017-07-04 DIAGNOSIS — R1310 Dysphagia, unspecified: Secondary | ICD-10-CM

## 2017-07-04 DIAGNOSIS — R438 Other disturbances of smell and taste: Secondary | ICD-10-CM | POA: Diagnosis not present

## 2017-07-04 DIAGNOSIS — R1319 Other dysphagia: Secondary | ICD-10-CM

## 2017-07-04 DIAGNOSIS — K219 Gastro-esophageal reflux disease without esophagitis: Secondary | ICD-10-CM | POA: Diagnosis not present

## 2017-07-04 DIAGNOSIS — K5901 Slow transit constipation: Secondary | ICD-10-CM

## 2017-07-04 NOTE — Patient Instructions (Addendum)
DRINK WATER TO KEEP YOUR URINE LIGHT YELLOW.  FOLLOW A HIGH FIBER/LOW FAT DIET. MEATS SHOULD BE BAKED, BROILED, OR BOILED. AVOID FRIED FOOD.  AVOID REFLUX TRIGGERS. SEE INFO BELOW.   USE MILK OF MAGNESIA PILLS OR LIQUID DAILY TO REDUCE CONSTIPATION. HOLD FOR DIARRHEA.  CONTINUE OMEPRAZOLE.  TAKE 30 MINUTES PRIOR TO YOUR FIRST MEAL.   SEE YOUR ENT DOCTOR IF YOU KEEP HAVING A BAD TASTE IN THE BACK OF YOUR THROAT.  FOLLOW UP IN 6 MOS.     Lifestyle and home remedies TO CONTROL HEARTBURN/REFLUX You may eliminate or reduce the frequency of heartburn by making the following lifestyle changes:  . Control your weight. Being overweight is a major risk factor for heartburn and GERD. Excess pounds put pressure on your abdomen, pushing up your stomach and causing acid to back up into your esophagus.   . Eat smaller meals. 4 TO 6 MEALS A DAY. This reduces pressure on the lower esophageal sphincter, helping to prevent the valve from opening and acid from washing back into your esophagus.   [name] your belt. Clothes that fit tightly around your waist put pressure on your abdomen and the lower esophageal sphincter.  .  . Eliminate heartburn triggers. Everyone has specific triggers. Common triggers such as fatty or fried foods, spicy food, tomato sauce, carbonated beverages, alcohol, chocolate, mint, garlic, onion, caffeine and nicotine may make heartburn worse.   Marland Kitchen Avoid stooping or bending. Tying your shoes is OK. Bending over for longer periods to weed your garden isn't, especially soon after eating.   . Don't lie down after a meal. Wait at least three to four hours after eating before going to bed, and don't lie down right after eating.   Alternative medicine . Several home remedies exist for treating GERD, but they provide only temporary relief. They include drinking baking soda (sodium bicarbonate) added to water or drinking other fluids such as baking soda mixed with cream of tartar and  water. . Although these liquids create temporary relief by neutralizing, washing away or buffering acids, eventually they aggravate the situation by adding gas and fluid to your stomach, increasing pressure and causing more acid reflux. Further, adding more sodium to your diet may increase your blood pressure and add stress to your heart, and excessive bicarbonate ingestion can alter the acid-base balance in your body.

## 2017-07-04 NOTE — Assessment & Plan Note (Signed)
SYMPTOMS NOT IDEALLY CONTROLLED & MOST LIKELY DUE TO POSTERIOR NASAL DRIP, LESS LIKELY UNCONTROLLED GERD.  DRINK WATER TO KEEP YOUR URINE LIGHT YELLOW. FOLLOW A LOW FAT DIET. MEATS SHOULD BE BAKED, BROILED, OR BOILED. AVOID FRIED FOOD. AVOID REFLUX TRIGGERS.  HANDOUT GIVEN. CONTINUE OMEPRAZOLE.  TAKE 30 MINUTES PRIOR TO YOUR FIRST MEAL. SEE YOUR ENT DOCTOR IF YOU KEEP HAVING A BAD TASTE IN THE BACK OF YOUR THROAT.  FOLLOW UP IN 6 MOS.

## 2017-07-04 NOTE — Assessment & Plan Note (Signed)
SYMPTOMS CONTROLLED/RESOLVED.  CONTINUE TO MONITOR SYMPTOMS. 

## 2017-07-04 NOTE — Progress Notes (Signed)
ON RECALL  °
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
## 2017-07-04 NOTE — Assessment & Plan Note (Signed)
SYMPTOMS NOT IDEALLY CONTROLLED.  DRINK WATER TO KEEP YOUR URINE LIGHT YELLOW. FOLLOW A HIGH FIBER DIET.  USE MILK OF MAGNESIA PILLS OR LIQUID DAILY TO REDUCE CONSTIPATION. HOLD FOR DIARRHEA. FOLLOW UP IN 6 MOS.

## 2017-07-04 NOTE — Progress Notes (Signed)
cc'ed to pcp °

## 2017-07-04 NOTE — Assessment & Plan Note (Signed)
SYMPTOMS CONTROLLED/RESOLVED.  MOST LIKELY DUE TO POSTERIOR NASAL DRIP, LESS LIKELY UNCONTROLLED GERD.  DRINK WATER TO KEEP YOUR URINE LIGHT YELLOW. FOLLOW A LOW FAT DIET. MEATS SHOULD BE BAKED, BROILED, OR BOILED. AVOID FRIED FOOD. AVOID REFLUX TRIGGERS.  HANDOUT GIVEN. CONTINUE OMEPRAZOLE.  TAKE 30 MINUTES PRIOR TO YOUR FIRST MEAL. SEE YOUR ENT DOCTOR IF YOU KEEP HAVING A BAD TASTE IN THE BACK OF YOUR THROAT.  FOLLOW UP IN 6 MOS.

## 2017-07-08 ENCOUNTER — Other Ambulatory Visit: Payer: Self-pay | Admitting: Gastroenterology

## 2017-07-10 ENCOUNTER — Other Ambulatory Visit: Payer: Self-pay | Admitting: Neurosurgery

## 2017-07-23 ENCOUNTER — Encounter (HOSPITAL_COMMUNITY)
Admission: RE | Admit: 2017-07-23 | Discharge: 2017-07-23 | Disposition: A | Discharge status: Home / Self Care | Payer: Medicare Other | Source: Ambulatory Visit | Attending: Neurosurgery | Admitting: Neurosurgery

## 2017-07-23 ENCOUNTER — Encounter (HOSPITAL_COMMUNITY): Payer: Self-pay

## 2017-07-23 LAB — CBC
HCT: 40.8 % (ref 36.0–46.0)
Hemoglobin: 13.9 g/dL (ref 12.0–15.0)
MCH: 29.6 pg (ref 26.0–34.0)
MCHC: 34.1 g/dL (ref 30.0–36.0)
MCV: 87 fL (ref 78.0–100.0)
Platelets: 296 10*3/uL (ref 150–400)
RBC: 4.69 MIL/uL (ref 3.87–5.11)
RDW: 13.3 % (ref 11.5–15.5)
WBC: 8.9 10*3/uL (ref 4.0–10.5)

## 2017-07-23 LAB — BASIC METABOLIC PANEL
Anion gap: 8 (ref 5–15)
BUN: 11 mg/dL (ref 6–20)
CO2: 26 mmol/L (ref 22–32)
Calcium: 9.2 mg/dL (ref 8.9–10.3)
Chloride: 104 mmol/L (ref 101–111)
Creatinine, Ser: 0.83 mg/dL (ref 0.44–1.00)
GFR calc Af Amer: >60 mL/min (ref >60)
GFR calc non Af Amer: >60 mL/min (ref >60)
Glucose, Bld: 98 mg/dL (ref 65–99)
Potassium: 4 mmol/L (ref 3.5–5.1)
Sodium: 138 mmol/L (ref 135–145)

## 2017-07-23 LAB — SURGICAL PCR SCREEN
MRSA, PCR: NEGATIVE
Staphylococcus aureus: NEGATIVE

## 2017-07-23 NOTE — Progress Notes (Signed)
PCP is Dr. Lisette Grinder Cardiologist is Dr. Serafina Royals EKG noted in care everywhere - request sent for tracing. Request sent for stress test and echo (she is not sure if she had an echo or not) Denies ever having a  Card cath.  Denies having any cough, fever, or chest pain.

## 2017-07-23 NOTE — Pre-Procedure Instructions (Signed)
Victoria Molina  07/23/2017      [place] [address]), [place] [address] [place]) [place] [address] Phone: [phone] Fax: [phone]    Your procedure is scheduled on  Oct. 5 .  Report to [facility] at 830 A.M.  Call this number if you have problems the morning of surgery:  [phone]   Remember:  Do not eat food or drink liquids after midnight.  Take these medicines the morning of surgery with A SIP OF WATER alprazolam (xanax) if needed, Amlodipine (Norvasc), Cyclobenzaprine (Flexeril0 if needed, Isosorbide mononitrate (Imdur), Loratadine (Claritin), Omeprazole (Prilosec)  Stop taking Mobic, Aleve, Fish oil, vitamins, Ibuprofen, Advil, Motrin, Aspirin, BC's, Goody's   Do not wear jewelry, make-up or nail polish.  Do not wear lotions, powders, or perfumes, or deoderant.  Do not shave 48 hours prior to surgery.  Men may shave face and neck.  Do not bring valuables to the hospital.  [facility] is not responsible for any belongings or valuables.  Contacts, dentures or bridgework may not be worn into surgery.  Leave your suitcase in the car.  After surgery it may be brought to your room.  For patients admitted to the hospital, discharge time will be determined by your treatment team.  Patients discharged the day of surgery will not be allowed to drive home.   Special instructions:  Victoria Molina - Preparing for Surgery  Before surgery, you can play an important role.  Because skin is not sterile, your skin needs to be as free of germs as possible.  You can reduce the number of germs on you skin by washing with CHG (chlorahexidine gluconate) soap before surgery.  CHG is an antiseptic cleaner which kills germs and bonds with the skin to continue killing germs even after washing.  Please DO NOT use if you have an allergy to CHG or antibacterial soaps.  If your skin becomes reddened/irritated stop  using the CHG and inform your nurse when you arrive at Short Stay.  Do not shave (including legs and underarms) for at least 48 hours prior to the first CHG shower.  You may shave your face.  Please follow these instructions carefully:   1.  Shower with CHG Soap the night before surgery and the                                morning of Surgery.  2.  If you choose to wash your hair, wash your hair first as usual with your       normal shampoo.  3.  After you shampoo, rinse your hair and body thoroughly to remove the                      Shampoo.  4.  Use CHG as you would any other liquid soap.  You can apply chg directly       to the skin and wash gently with scrungie or a clean washcloth.  5.  Apply the CHG Soap to your body ONLY FROM THE NECK DOWN.        Do not use on open wounds or open sores.  Avoid contact with your eyes,       ears, mouth and genitals (private parts).  Wash genitals (private parts)       with your normal soap.  6.  Wash thoroughly, paying special attention to the area where your surgery        will be performed.  7.  Thoroughly rinse your body with warm water from the neck down.  8.  DO NOT shower/wash with your normal soap after using and rinsing off       the CHG Soap.  9.  Pat yourself dry with a clean towel.            10.  Wear clean pajamas.            11.  Place clean sheets on your bed the night of your first shower and do not        sleep with pets.  Day of Surgery  Do not apply any lotions/deoderants the morning of surgery.  Please wear clean clothes to the hospital/surgery center.     Please read over the following fact sheets that you were given. Pain Booklet, Coughing and Deep Breathing, MRSA Information and Surgical Site Infection Prevention

## 2017-07-24 NOTE — Progress Notes (Signed)
Anesthesia chart review: Patient is a 71 year old female scheduled for L4-5 laminectomy on 07/26/17 by Dr. Ashok Pall.  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 BMI is consistent with obesity.   - PCP is Dr. Lisette Grinder with [facility] (Care Everywhere).  - Cardiologist is Dr. Serafina Royals with [facility]). Last visit 07/03/17 with Etta Quill, NP. Patient was "considered at lowest risk possible for planned back surgery."  Meds include Xanax, amlodipine, Flexeril, Imdur, Claritin, fish oil, Prilosec.  BP (!) 154/75   Pulse 73   Temp 36.7 C (Oral)   Resp 18   Ht 5\' 2"  (1.575 m)   Wt 179 lb 3 oz (81.3 kg)   SpO2 99%   BMI 32.77 kg/m   EKG 07/03/17 [facility]): SR with first degree AV block.   Nuclear exercise stress test 07/24/16 ([facility]; Care Everywhere): Normal treadmill EKG without evidence of ischemia or arrhythmia Normal myocardial perfusion without evidence of myocardial ischemia  Stress Echo 01/26/15 ([facility]; Care Everywhere): Normal stress echocardiogram.   Echocardiogram 12/31/13 [facility]; scanned under Media tab, Correspondence 07/09/14). Conclusion: Normal LV systolic function. EF 55%. Moderate biatrial enlargement. Moderate mitral and mild tricuspid insufficiency.  Stress echo on 11/22/11 [facility]) showed: Normal treadmill ECG without evidence of ischemia or dysrhythmia. Excellent exercise tolerance for age. Normal stress echocardiographic images without evidence of ischemia.  Preoperative labs noted.  If no acute changes then I anticipate that she can proceed as planned.  George Hugh [facility] Short Stay Center/Anesthesiology Phone [phone] 07/24/2017 4:34 PM

## 2017-07-26 ENCOUNTER — Ambulatory Visit (HOSPITAL_COMMUNITY): Payer: Medicare Other

## 2017-07-26 ENCOUNTER — Ambulatory Visit (HOSPITAL_COMMUNITY): Payer: Medicare Other | Admitting: Certified Registered Nurse Anesthetist

## 2017-07-26 ENCOUNTER — Inpatient Hospital Stay (HOSPITAL_COMMUNITY)
Admission: RE | Admit: 2017-07-26 | Discharge: 2017-07-30 | DRG: 520 | Disposition: A | Discharge status: Home / Self Care | Payer: Medicare Other | Source: Ambulatory Visit | Attending: Neurosurgery | Admitting: Neurosurgery

## 2017-07-26 ENCOUNTER — Encounter (HOSPITAL_COMMUNITY): Payer: Self-pay

## 2017-07-26 ENCOUNTER — Ambulatory Visit (HOSPITAL_COMMUNITY): Payer: Medicare Other | Admitting: Vascular Surgery

## 2017-07-26 ENCOUNTER — Encounter (HOSPITAL_COMMUNITY)
Admission: RE | Disposition: A | Discharge status: Home / Self Care | Payer: Self-pay | Source: Ambulatory Visit | Attending: Neurosurgery

## 2017-07-26 DIAGNOSIS — Z885 Allergy status to narcotic agent status: Secondary | ICD-10-CM | POA: Diagnosis not present

## 2017-07-26 DIAGNOSIS — M1991 Primary osteoarthritis, unspecified site: Secondary | ICD-10-CM | POA: Diagnosis not present

## 2017-07-26 DIAGNOSIS — K219 Gastro-esophageal reflux disease without esophagitis: Secondary | ICD-10-CM | POA: Diagnosis not present

## 2017-07-26 DIAGNOSIS — I10 Essential (primary) hypertension: Secondary | ICD-10-CM | POA: Diagnosis present

## 2017-07-26 DIAGNOSIS — Z882 Allergy status to sulfonamides status: Secondary | ICD-10-CM

## 2017-07-26 DIAGNOSIS — Z888 Allergy status to other drugs, medicaments and biological substances status: Secondary | ICD-10-CM

## 2017-07-26 DIAGNOSIS — Z87891 Personal history of nicotine dependence: Secondary | ICD-10-CM

## 2017-07-26 DIAGNOSIS — Z981 Arthrodesis status: Secondary | ICD-10-CM

## 2017-07-26 DIAGNOSIS — Z883 Allergy status to other anti-infective agents status: Secondary | ICD-10-CM

## 2017-07-26 DIAGNOSIS — Z91041 Radiographic dye allergy status: Secondary | ICD-10-CM

## 2017-07-26 DIAGNOSIS — M48062 Spinal stenosis, lumbar region with neurogenic claudication: Principal | ICD-10-CM | POA: Diagnosis present

## 2017-07-26 DIAGNOSIS — Z419 Encounter for procedure for purposes other than remedying health state, unspecified: Secondary | ICD-10-CM

## 2017-07-26 DIAGNOSIS — F419 Anxiety disorder, unspecified: Secondary | ICD-10-CM | POA: Diagnosis not present

## 2017-07-26 HISTORY — PX: LUMBAR LAMINECTOMY/DECOMPRESSION MICRODISCECTOMY: SHX5026

## 2017-07-26 HISTORY — DX: Spinal stenosis, lumbar region with neurogenic claudication: M48.062

## 2017-07-26 SURGERY — LUMBAR LAMINECTOMY/DECOMPRESSION MICRODISCECTOMY 1 LEVEL
Anesthesia: General | Site: Back

## 2017-07-26 MED ORDER — ACETAMINOPHEN 500 MG PO TABS
1000.0000 mg | ORAL_TABLET | Freq: Once | ORAL | Status: AC
  Administered 2017-07-26: 1000 mg via ORAL
  Filled 2017-07-26: qty 2

## 2017-07-26 MED ORDER — DIAZEPAM 5 MG PO TABS
5.0000 mg | ORAL_TABLET | Freq: Four times a day (QID) | ORAL | Status: DC | PRN
  Administered 2017-07-27 – 2017-07-28 (×2): 5 mg via ORAL
  Filled 2017-07-26 (×2): qty 1

## 2017-07-26 MED ORDER — CEFAZOLIN SODIUM-DEXTROSE 2-4 GM/100ML-% IV SOLN
2.0000 g | INTRAVENOUS | Status: AC
  Administered 2017-07-26: 2 g via INTRAVENOUS
  Filled 2017-07-26: qty 100

## 2017-07-26 MED ORDER — ACETAMINOPHEN 325 MG PO TABS
650.0000 mg | ORAL_TABLET | ORAL | Status: DC | PRN
  Administered 2017-07-27 – 2017-07-30 (×3): 650 mg via ORAL
  Filled 2017-07-26 (×4): qty 2

## 2017-07-26 MED ORDER — ONDANSETRON HCL 4 MG/2ML IJ SOLN
INTRAMUSCULAR | Status: DC | PRN
  Administered 2017-07-26: 4 mg via INTRAVENOUS

## 2017-07-26 MED ORDER — SUGAMMADEX SODIUM 200 MG/2ML IV SOLN
INTRAVENOUS | Status: AC
  Filled 2017-07-26: qty 2

## 2017-07-26 MED ORDER — BUPIVACAINE HCL 0.5 % IJ SOLN
INTRAMUSCULAR | Status: DC | PRN
  Administered 2017-07-26: 20 mL

## 2017-07-26 MED ORDER — PHENOL 1.4 % MT LIQD: 1.0000 | OROMUCOSAL | Status: DC | PRN

## 2017-07-26 MED ORDER — PROMETHAZINE HCL 25 MG PO TABS: 12.5000 mg | ORAL_TABLET | Freq: Four times a day (QID) | ORAL | Status: DC | PRN

## 2017-07-26 MED ORDER — LIDOCAINE 2% (20 MG/ML) 5 ML SYRINGE
INTRAMUSCULAR | Status: DC | PRN
  Administered 2017-07-26: 100 mg via INTRAVENOUS

## 2017-07-26 MED ORDER — FENTANYL CITRATE (PF) 100 MCG/2ML IJ SOLN
25.0000 ug | INTRAMUSCULAR | Status: DC | PRN
  Administered 2017-07-26: 25 ug via INTRAVENOUS

## 2017-07-26 MED ORDER — ONDANSETRON HCL 4 MG PO TABS
4.0000 mg | ORAL_TABLET | Freq: Four times a day (QID) | ORAL | Status: DC | PRN
  Administered 2017-07-28: 4 mg via ORAL
  Filled 2017-07-26: qty 1

## 2017-07-26 MED ORDER — POTASSIUM CHLORIDE IN NACL 20-0.9 MEQ/L-% IV SOLN
INTRAVENOUS | Status: DC
  Administered 2017-07-26: 17:00:00 via INTRAVENOUS
  Administered 2017-07-27: 1000 mL via INTRAVENOUS
  Filled 2017-07-26 (×5): qty 1000

## 2017-07-26 MED ORDER — VITAMIN B-12 1000 MCG PO TABS
1000.0000 ug | ORAL_TABLET | Freq: Every day | ORAL | Status: DC
  Administered 2017-07-26 – 2017-07-30 (×4): 1000 ug via ORAL
  Filled 2017-07-26 (×4): qty 1

## 2017-07-26 MED ORDER — PROMETHAZINE HCL 25 MG/ML IJ SOLN
INTRAMUSCULAR | Status: AC
  Filled 2017-07-26: qty 1

## 2017-07-26 MED ORDER — THROMBIN 5000 UNITS EX SOLR
CUTANEOUS | Status: DC | PRN
  Administered 2017-07-26 (×2): 5000 [IU] via TOPICAL

## 2017-07-26 MED ORDER — PROPOFOL 10 MG/ML IV BOLUS
INTRAVENOUS | Status: AC
  Filled 2017-07-26: qty 20

## 2017-07-26 MED ORDER — MIDAZOLAM HCL 2 MG/2ML IJ SOLN
INTRAMUSCULAR | Status: AC
  Filled 2017-07-26: qty 2

## 2017-07-26 MED ORDER — LACTATED RINGERS IV SOLN
INTRAVENOUS | Status: DC
  Administered 2017-07-26 (×2): via INTRAVENOUS

## 2017-07-26 MED ORDER — ONDANSETRON HCL 4 MG/2ML IJ SOLN
4.0000 mg | Freq: Four times a day (QID) | INTRAMUSCULAR | Status: DC | PRN
  Administered 2017-07-26: 4 mg via INTRAVENOUS
  Filled 2017-07-26: qty 2

## 2017-07-26 MED ORDER — KETOROLAC TROMETHAMINE 15 MG/ML IJ SOLN
7.5000 mg | Freq: Four times a day (QID) | INTRAMUSCULAR | Status: AC
  Administered 2017-07-26 – 2017-07-27 (×4): 7.5 mg via INTRAVENOUS
  Filled 2017-07-26 (×3): qty 1

## 2017-07-26 MED ORDER — LIDOCAINE-EPINEPHRINE 0.5 %-1:200000 IJ SOLN
INTRAMUSCULAR | Status: AC
  Filled 2017-07-26: qty 1

## 2017-07-26 MED ORDER — KETOROLAC TROMETHAMINE 15 MG/ML IJ SOLN
INTRAMUSCULAR | Status: AC
  Filled 2017-07-26: qty 1

## 2017-07-26 MED ORDER — SODIUM CHLORIDE 0.9% FLUSH
3.0000 mL | Freq: Two times a day (BID) | INTRAVENOUS | Status: DC
  Administered 2017-07-27: 3 mL via INTRAVENOUS

## 2017-07-26 MED ORDER — BISACODYL 5 MG PO TBEC: 5.0000 mg | DELAYED_RELEASE_TABLET | Freq: Every day | ORAL | Status: DC | PRN

## 2017-07-26 MED ORDER — SUGAMMADEX SODIUM 200 MG/2ML IV SOLN
INTRAVENOUS | Status: DC | PRN
  Administered 2017-07-26: 200 mg via INTRAVENOUS

## 2017-07-26 MED ORDER — BUPIVACAINE HCL (PF) 0.5 % IJ SOLN
INTRAMUSCULAR | Status: AC
  Filled 2017-07-26: qty 30

## 2017-07-26 MED ORDER — ZOLPIDEM TARTRATE 5 MG PO TABS: 5.0000 mg | ORAL_TABLET | Freq: Every evening | ORAL | Status: DC | PRN

## 2017-07-26 MED ORDER — OXYCODONE HCL 5 MG PO TABS
5.0000 mg | ORAL_TABLET | ORAL | Status: DC | PRN
  Administered 2017-07-26: 5 mg via ORAL
  Filled 2017-07-26: qty 1

## 2017-07-26 MED ORDER — HEMOSTATIC AGENTS (NO CHARGE) OPTIME
TOPICAL | Status: DC | PRN
  Administered 2017-07-26: 1 via TOPICAL

## 2017-07-26 MED ORDER — DOCUSATE SODIUM 100 MG PO CAPS
100.0000 mg | ORAL_CAPSULE | Freq: Two times a day (BID) | ORAL | Status: DC
  Administered 2017-07-26 – 2017-07-30 (×6): 100 mg via ORAL
  Filled 2017-07-26 (×6): qty 1

## 2017-07-26 MED ORDER — PROMETHAZINE HCL 25 MG/ML IJ SOLN
6.2500 mg | Freq: Once | INTRAMUSCULAR | Status: AC
  Administered 2017-07-26: 6.25 mg via INTRAVENOUS

## 2017-07-26 MED ORDER — LORATADINE 10 MG PO TABS
10.0000 mg | ORAL_TABLET | Freq: Every day | ORAL | Status: DC
  Administered 2017-07-26 – 2017-07-30 (×5): 10 mg via ORAL
  Filled 2017-07-26 (×5): qty 1

## 2017-07-26 MED ORDER — DEXAMETHASONE SODIUM PHOSPHATE 10 MG/ML IJ SOLN
INTRAMUSCULAR | Status: DC | PRN
  Administered 2017-07-26: 10 mg via INTRAVENOUS

## 2017-07-26 MED ORDER — ONDANSETRON HCL 4 MG/2ML IJ SOLN
INTRAMUSCULAR | Status: AC
  Filled 2017-07-26: qty 2

## 2017-07-26 MED ORDER — MORPHINE SULFATE (PF) 2 MG/ML IV SOLN: 1.0000 mg | INTRAVENOUS | Status: DC | PRN

## 2017-07-26 MED ORDER — FENTANYL CITRATE (PF) 250 MCG/5ML IJ SOLN
INTRAMUSCULAR | Status: AC
  Filled 2017-07-26: qty 5

## 2017-07-26 MED ORDER — SODIUM CHLORIDE 0.9% FLUSH: 3.0000 mL | INTRAVENOUS | Status: DC | PRN

## 2017-07-26 MED ORDER — SODIUM CHLORIDE 0.9 % IV SOLN: 250.0000 mL | INTRAVENOUS | Status: DC

## 2017-07-26 MED ORDER — ADULT MULTIVITAMIN W/MINERALS CH
1.0000 | ORAL_TABLET | Freq: Every day | ORAL | Status: DC
  Administered 2017-07-26 – 2017-07-30 (×4): 1 via ORAL
  Filled 2017-07-26 (×5): qty 1

## 2017-07-26 MED ORDER — ROCURONIUM BROMIDE 10 MG/ML (PF) SYRINGE
PREFILLED_SYRINGE | INTRAVENOUS | Status: DC | PRN
  Administered 2017-07-26: 50 mg via INTRAVENOUS

## 2017-07-26 MED ORDER — MAGNESIUM CITRATE PO SOLN
1.0000 | Freq: Once | ORAL | Status: AC | PRN
  Administered 2017-07-27: 1 via ORAL
  Filled 2017-07-26: qty 296

## 2017-07-26 MED ORDER — THROMBIN 5000 UNITS EX SOLR
CUTANEOUS | Status: AC
  Filled 2017-07-26: qty 10000

## 2017-07-26 MED ORDER — 0.9 % SODIUM CHLORIDE (POUR BTL) OPTIME
TOPICAL | Status: DC | PRN
  Administered 2017-07-26: 1000 mL

## 2017-07-26 MED ORDER — FENTANYL CITRATE (PF) 250 MCG/5ML IJ SOLN
INTRAMUSCULAR | Status: DC | PRN
  Administered 2017-07-26: 100 ug via INTRAVENOUS

## 2017-07-26 MED ORDER — AMLODIPINE BESYLATE 5 MG PO TABS
5.0000 mg | ORAL_TABLET | Freq: Every day | ORAL | Status: DC
  Administered 2017-07-27 – 2017-07-30 (×4): 5 mg via ORAL
  Filled 2017-07-26 (×4): qty 1

## 2017-07-26 MED ORDER — PROPOFOL 10 MG/ML IV BOLUS
INTRAVENOUS | Status: DC | PRN
  Administered 2017-07-26: 150 mg via INTRAVENOUS

## 2017-07-26 MED ORDER — ACETAMINOPHEN 650 MG RE SUPP: 650.0000 mg | RECTAL | Status: DC | PRN

## 2017-07-26 MED ORDER — GABAPENTIN 300 MG PO CAPS
300.0000 mg | ORAL_CAPSULE | Freq: Three times a day (TID) | ORAL | Status: DC
  Administered 2017-07-26 – 2017-07-30 (×12): 300 mg via ORAL
  Filled 2017-07-26 (×12): qty 1

## 2017-07-26 MED ORDER — ALPRAZOLAM 0.5 MG PO TABS: 0.5000 mg | ORAL_TABLET | Freq: Three times a day (TID) | ORAL | Status: DC | PRN

## 2017-07-26 MED ORDER — SENNA 8.6 MG PO TABS
1.0000 | ORAL_TABLET | Freq: Two times a day (BID) | ORAL | Status: DC
  Administered 2017-07-26 – 2017-07-30 (×5): 8.6 mg via ORAL
  Filled 2017-07-26 (×5): qty 1

## 2017-07-26 MED ORDER — ACETAMINOPHEN 500 MG PO TABS
1000.0000 mg | ORAL_TABLET | Freq: Four times a day (QID) | ORAL | Status: DC
  Administered 2017-07-27 – 2017-07-29 (×7): 1000 mg via ORAL
  Filled 2017-07-26 (×9): qty 2

## 2017-07-26 MED ORDER — PNEUMOCOCCAL VAC POLYVALENT 25 MCG/0.5ML IJ INJ: 0.5000 mL | INJECTION | INTRAMUSCULAR | Status: DC | PRN

## 2017-07-26 MED ORDER — OXYCODONE HCL ER 10 MG PO T12A: 10.0000 mg | EXTENDED_RELEASE_TABLET | Freq: Two times a day (BID) | ORAL | Status: DC

## 2017-07-26 MED ORDER — CHLORHEXIDINE GLUCONATE CLOTH 2 % EX PADS: 6.0000 | MEDICATED_PAD | Freq: Once | CUTANEOUS | Status: DC

## 2017-07-26 MED ORDER — ONDANSETRON HCL 4 MG/2ML IJ SOLN
4.0000 mg | Freq: Once | INTRAMUSCULAR | Status: AC | PRN
  Administered 2017-07-26: 4 mg via INTRAVENOUS

## 2017-07-26 MED ORDER — LIDOCAINE-EPINEPHRINE 0.5 %-1:200000 IJ SOLN
INTRAMUSCULAR | Status: DC | PRN
  Administered 2017-07-26: 10 mL

## 2017-07-26 MED ORDER — PANTOPRAZOLE SODIUM 40 MG PO TBEC
40.0000 mg | DELAYED_RELEASE_TABLET | Freq: Every day | ORAL | Status: DC
  Administered 2017-07-26: 40 mg via ORAL
  Filled 2017-07-26 (×2): qty 1

## 2017-07-26 MED ORDER — POLYETHYLENE GLYCOL 3350 17 G PO PACK: 17.0000 g | PACK | Freq: Every day | ORAL | Status: DC | PRN

## 2017-07-26 MED ORDER — GLYCOPYRROLATE 0.2 MG/ML IV SOSY
PREFILLED_SYRINGE | INTRAVENOUS | Status: DC | PRN
  Administered 2017-07-26 (×2): .1 mg via INTRAVENOUS

## 2017-07-26 MED ORDER — FENTANYL CITRATE (PF) 100 MCG/2ML IJ SOLN
INTRAMUSCULAR | Status: AC
  Filled 2017-07-26: qty 2

## 2017-07-26 MED ORDER — ALUM & MAG HYDROXIDE-SIMETH 200-200-20 MG/5ML PO SUSP
30.0000 mL | Freq: Four times a day (QID) | ORAL | Status: DC | PRN
  Administered 2017-07-26: 30 mL via ORAL
  Filled 2017-07-26: qty 30

## 2017-07-26 MED ORDER — PROMETHAZINE HCL 25 MG/ML IJ SOLN
12.5000 mg | Freq: Four times a day (QID) | INTRAMUSCULAR | Status: DC | PRN
  Administered 2017-07-26: 12.5 mg via INTRAVENOUS
  Filled 2017-07-26: qty 1

## 2017-07-26 MED ORDER — ISOSORBIDE MONONITRATE ER 30 MG PO TB24
30.0000 mg | ORAL_TABLET | Freq: Every day | ORAL | Status: DC
  Administered 2017-07-27 – 2017-07-30 (×4): 30 mg via ORAL
  Filled 2017-07-26 (×4): qty 1

## 2017-07-26 MED ORDER — PROMETHAZINE HCL 25 MG RE SUPP: 12.5000 mg | Freq: Four times a day (QID) | RECTAL | Status: DC | PRN

## 2017-07-26 MED ORDER — MENTHOL 3 MG MT LOZG: 1.0000 | LOZENGE | OROMUCOSAL | Status: DC | PRN

## 2017-07-26 MED ORDER — MIDAZOLAM HCL 2 MG/2ML IJ SOLN
INTRAMUSCULAR | Status: DC | PRN
  Administered 2017-07-26: 2 mg via INTRAVENOUS

## 2017-07-26 SURGICAL SUPPLY — 53 items
ADH SKN CLS APL DERMABOND .7 (GAUZE/BANDAGES/DRESSINGS) ×1
APL SKNCLS STERI-STRIP NONHPOA (GAUZE/BANDAGES/DRESSINGS)
BAG DECANTER FOR FLEXI CONT (MISCELLANEOUS) ×1 IMPLANT
BENZOIN TINCTURE PRP APPL 2/3 (GAUZE/BANDAGES/DRESSINGS) IMPLANT
BLADE CLIPPER SURG (BLADE) IMPLANT
BUR MATCHSTICK NEURO 3.0 LAGG (BURR) ×3 IMPLANT
BUR PRECISION FLUTE 5.0 (BURR) ×2 IMPLANT
CANISTER SUCT 3000ML PPV (MISCELLANEOUS) ×3 IMPLANT
CARTRIDGE OIL MAESTRO DRILL (MISCELLANEOUS) ×1 IMPLANT
CLOSURE WOUND 1/2 X4 (GAUZE/BANDAGES/DRESSINGS)
DECANTER SPIKE VIAL GLASS SM (MISCELLANEOUS) ×3 IMPLANT
DERMABOND ADVANCED (GAUZE/BANDAGES/DRESSINGS) ×2
DERMABOND ADVANCED .7 DNX12 (GAUZE/BANDAGES/DRESSINGS) ×1 IMPLANT
DIFFUSER DRILL AIR PNEUMATIC (MISCELLANEOUS) ×3 IMPLANT
DRAPE LAPAROTOMY 100X72X124 (DRAPES) ×3 IMPLANT
DRAPE MICROSCOPE LEICA (MISCELLANEOUS) ×3 IMPLANT
DRAPE POUCH INSTRU U-SHP 10X18 (DRAPES) ×3 IMPLANT
DRAPE SURG 17X23 STRL (DRAPES) ×3 IMPLANT
DURAPREP 26ML APPLICATOR (WOUND CARE) ×3 IMPLANT
ELECT REM PT RETURN 9FT ADLT (ELECTROSURGICAL) ×3
ELECTRODE REM PT RTRN 9FT ADLT (ELECTROSURGICAL) ×1 IMPLANT
GAUZE SPONGE 4X4 12PLY STRL (GAUZE/BANDAGES/DRESSINGS) IMPLANT
GAUZE SPONGE 4X4 16PLY XRAY LF (GAUZE/BANDAGES/DRESSINGS) IMPLANT
GLOVE ECLIPSE 6.5 STRL STRAW (GLOVE) ×3 IMPLANT
GLOVE EXAM NITRILE LRG STRL (GLOVE) IMPLANT
GLOVE EXAM NITRILE XL STR (GLOVE) IMPLANT
GLOVE EXAM NITRILE XS STR PU (GLOVE) IMPLANT
GOWN STRL REUS W/ TWL LRG LVL3 (GOWN DISPOSABLE) ×2 IMPLANT
GOWN STRL REUS W/ TWL XL LVL3 (GOWN DISPOSABLE) IMPLANT
GOWN STRL REUS W/TWL 2XL LVL3 (GOWN DISPOSABLE) IMPLANT
GOWN STRL REUS W/TWL LRG LVL3 (GOWN DISPOSABLE) ×6
GOWN STRL REUS W/TWL XL LVL3 (GOWN DISPOSABLE)
KIT BASIN OR (CUSTOM PROCEDURE TRAY) ×3 IMPLANT
KIT ROOM TURNOVER OR (KITS) ×3 IMPLANT
NDL HYPO 25X1 1.5 SAFETY (NEEDLE) ×1 IMPLANT
NDL SPNL 18GX3.5 QUINCKE PK (NEEDLE) IMPLANT
NEEDLE HYPO 25X1 1.5 SAFETY (NEEDLE) ×3 IMPLANT
NEEDLE SPNL 18GX3.5 QUINCKE PK (NEEDLE) IMPLANT
NS IRRIG 1000ML POUR BTL (IV SOLUTION) ×3 IMPLANT
OIL CARTRIDGE MAESTRO DRILL (MISCELLANEOUS) ×3
PACK LAMINECTOMY NEURO (CUSTOM PROCEDURE TRAY) ×3 IMPLANT
PAD ARMBOARD 7.5X6 YLW CONV (MISCELLANEOUS) ×9 IMPLANT
RUBBERBAND STERILE (MISCELLANEOUS) ×6 IMPLANT
SPONGE LAP 4X18 X RAY DECT (DISPOSABLE) IMPLANT
SPONGE SURGIFOAM ABS GEL SZ50 (HEMOSTASIS) ×3 IMPLANT
STRIP CLOSURE SKIN 1/2X4 (GAUZE/BANDAGES/DRESSINGS) IMPLANT
SUT VIC AB 0 CT1 18XCR BRD8 (SUTURE) ×1 IMPLANT
SUT VIC AB 0 CT1 8-18 (SUTURE) ×3
SUT VIC AB 2-0 CT1 18 (SUTURE) ×3 IMPLANT
SUT VIC AB 3-0 SH 8-18 (SUTURE) ×3 IMPLANT
TOWEL GREEN STERILE (TOWEL DISPOSABLE) ×3 IMPLANT
TOWEL GREEN STERILE FF (TOWEL DISPOSABLE) ×3 IMPLANT
WATER STERILE IRR 1000ML POUR (IV SOLUTION) ×3 IMPLANT

## 2017-07-26 NOTE — Progress Notes (Signed)
Patient arrived from PACU, vitals stable, mildly nauseas at this time, no pain. Patient to [facility]. Continue to monitor patient.

## 2017-07-26 NOTE — Op Note (Signed)
07/26/2017  11:57 AM  PATIENT:  Victoria Molina  71 y.o. 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 She was positioned prone on a Wilson frame with all pressure points padded. Her lumbar area and previous incision was prepped and draped in a sterile manner. I opened the inferior portion of the old incision and exposed the lamina of L4, and L5. I confirmed my location with an intraoperative xray, and by exposing the L4 pedicle screws.  I performed semihemilaminectomies bilaterally with the drill, and Kerrison punches. I removed the ligamentum flavum, and exposed the thecal sac. I left the spinous process of L4 intact and undercut it to decompress the spinal canal. I decompressed the L4 and L5 nerve roots bilaterally with the Kerrison punches. I irrigated the wound. I inspected the decompression , was satisfied with the decompression and then closed the wound. I approximated the thoracolumbar ligament, and the subcutaneous and subcuticular planes with vicryl sutures. I used Dermabond for a sterile dressing.   PLAN OF CARE: Admit to inpatient   PATIENT DISPOSITION:  PACU - hemodynamically stable.   Delay start of Pharmacological VTE agent (>24hrs) due to surgical blood loss or risk of bleeding:  yes

## 2017-07-26 NOTE — Anesthesia Preprocedure Evaluation (Addendum)
Anesthesia Evaluation  Patient identified by MRN, date of birth, ID band Patient awake    Reviewed: Allergy & Precautions, NPO status , Patient's Chart, lab work & pertinent test results  History of Anesthesia Complications (+) PONV, Family history of anesthesia reaction and history of anesthetic complications  Airway Mallampati: II  TM Distance: >3 FB Neck ROM: Full    Dental  (+) Teeth Intact, Dental Advisory Given, Caps,    Pulmonary former smoker,    Pulmonary exam normal breath sounds clear to auscultation       Cardiovascular hypertension, Pt. on medications (-) angina(-) CAD and (-) Past MI Normal cardiovascular exam Rhythm:Regular Rate:Normal     Neuro/Psych PSYCHIATRIC DISORDERS Anxiety negative neurological ROS     GI/Hepatic Neg liver ROS, hiatal hernia, GERD  Medicated,  Endo/Other  Obesity   Renal/GU negative Renal ROS     Musculoskeletal  (+) Arthritis ,   Abdominal   Peds  Hematology negative hematology ROS (+)   Anesthesia Other Findings Day of surgery medications reviewed with the patient.  Reproductive/Obstetrics                            Anesthesia Physical Anesthesia Plan  ASA: II  Anesthesia Plan: General   Post-op Pain Management:    Induction: Intravenous  PONV Risk Score and Plan: 4 or greater and Ondansetron, Dexamethasone, Midazolam and Treatment may vary due to age or medical condition  Airway Management Planned: Oral ETT  Additional Equipment:   Intra-op Plan:   Post-operative Plan: Extubation in OR  Informed Consent: I have reviewed the patients History and Physical, chart, labs and discussed the procedure including the risks, benefits and alternatives for the proposed anesthesia with the patient or authorized representative who has indicated his/her understanding and acceptance.   Dental advisory given  Plan Discussed with:  CRNA  Anesthesia Plan Comments: (Risks/benefits of general anesthesia discussed with patient including risk of damage to teeth, lips, gum, and tongue, nausea/vomiting, allergic reactions to medications, and the possibility of heart attack, stroke and death.  All patient questions answered.  Patient wishes to proceed.)       Anesthesia Quick Evaluation

## 2017-07-26 NOTE — Anesthesia Procedure Notes (Signed)
Procedure Name: Intubation Date/Time: 07/26/2017 10:14 AM Performed by: Valda Favia Pre-anesthesia Checklist: Patient identified, Emergency Drugs available, Suction available, Patient being monitored and Timeout performed Patient Re-evaluated:Patient Re-evaluated prior to induction Oxygen Delivery Method: Circle system utilized Preoxygenation: Pre-oxygenation with 100% oxygen Induction Type: IV induction Ventilation: Mask ventilation without difficulty Laryngoscope Size: Mac and 4 Grade View: Grade I Tube type: Oral Tube size: 7.0 mm Number of attempts: 1 Airway Equipment and Method: Stylet

## 2017-07-26 NOTE — Transfer of Care (Signed)
Immediate Anesthesia Transfer of Care Note  Patient: Victoria Molina  Procedure(s) Performed: LAMINECTOMY LUMBAR FOUR - LUMBAR FIVE (N/A Back)  Patient Location: PACU  Anesthesia Type:General  Level of Consciousness: awake, alert  and oriented  Airway & Oxygen Therapy: Patient Spontanous Breathing and Patient connected to nasal cannula oxygen  Post-op Assessment: Report given to RN and Post -op Vital signs reviewed and stable  Post vital signs: Reviewed and stable  Last Vitals:  Vitals:   07/26/17 0833 07/26/17 1158  BP: (!) 162/72 (!) 146/73  Pulse: 77 66  Resp: 18 15  Temp: 36.6 C (!) 36.3 C  SpO2: 99% 98%    Last Pain:  Vitals:   07/26/17 1158  TempSrc:   PainSc: (P) 0-No pain      Patients Stated Pain Goal: 2 (26/83/41 9622)  Complications: No apparent anesthesia complications

## 2017-07-26 NOTE — Progress Notes (Signed)
Patient given 5mg  oxy IR, c/o nausea after stating its from medication and that oxycodone makes her sick. Requesting medication be changed. On call MD paged.

## 2017-07-26 NOTE — Anesthesia Postprocedure Evaluation (Signed)
Anesthesia Post Note  Patient: [name]  Procedure(s) Performed: LAMINECTOMY LUMBAR FOUR - LUMBAR FIVE (N/A Back)     Patient location during evaluation: PACU Anesthesia Type: General Level of consciousness: awake and alert Pain management: pain level controlled Vital Signs Assessment: post-procedure vital signs reviewed and stable Respiratory status: spontaneous breathing, nonlabored ventilation and respiratory function stable Cardiovascular status: blood pressure returned to baseline and stable Postop Assessment: no apparent nausea or vomiting Anesthetic complications: no    Last Vitals:  Vitals:   07/26/17 1345 07/26/17 1406  BP: 132/81 (!) 145/63  Pulse: 71 68  Resp: 18 16  Temp: (!) 36.3 C (!) 36.3 C  SpO2: 95% 99%    Last Pain:  Vitals:   07/26/17 1535  TempSrc:   PainSc: 5                  Catalina Gravel

## 2017-07-26 NOTE — Brief Op Note (Signed)
Patient reports iodine ok for use, her allergy is to contrastt media

## 2017-07-26 NOTE — Plan of Care (Signed)
Problem: Bladder/Genitourinary: Goal: Urinary functional status for postoperative course will improve Outcome: Progressing Patient voiding without problem, ambulating to bathroom with standby assist.
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
## 2017-07-26 NOTE — Progress Notes (Signed)
Received patient lying /sitting semifowlers up in bed.  She states her pain is not too bad, but the nausea is bad.  Phenergan 12.5 via IV given. Will attempt to give oral medications after medication has had time to take effect.  Offered to brush teeth, give oral mouth rinse, but she wants to wait. Requested that I raise one of the lower rails, which I did. Safety maintained. Will continue to monitor.

## 2017-07-27 ENCOUNTER — Encounter (HOSPITAL_COMMUNITY): Payer: Self-pay | Admitting: Neurosurgery

## 2017-07-27 NOTE — Evaluation (Signed)
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 S/p laminectomy L4-L5.   Clinical Impression   Pt admitted with the above diagnoses and presents with below problem list. Pt will benefit from continued acute OT to address the below listed deficits and maximize independence with basic ADLs prior to d/c home. PTA pt was independent with ADLs. Pt is currently set up to min guard with UB ADLs and functional mobility/transfers, min A for LB ADLs.      Follow Up Recommendations  No OT follow up    Equipment Recommendations  3 in 1 bedside commode    Recommendations for Other Services       Precautions / Restrictions Precautions Precautions: Back;Fall Precaution Booklet Issued: Yes (comment) Precaution Comments: reviewed 3/3 back precautions with patient Required Braces or Orthoses:  (no brace required - per MD) Restrictions Weight Bearing Restrictions: No      Mobility Bed Mobility Overal bed mobility: Needs Assistance Bed Mobility: Rolling;Sidelying to Sit Rolling: Supervision Sidelying to sit: Supervision          Transfers Overall transfer level: Needs assistance Equipment used: None Transfers: Sit to/from Stand Sit to Stand: Supervision         General transfer comment: completed with no difficulty. supervision for safety    Balance Overall balance assessment: Needs assistance Sitting-balance support: Feet supported;No upper extremity supported Sitting balance-Leahy Scale: Good     Standing balance support: During functional activity;Single extremity supported Standing balance-Leahy Scale: Good                             ADL either performed or assessed with clinical judgement   ADL Overall ADL's : Needs assistance/impaired Eating/Feeding: Set  up;Sitting   Grooming: Min guard;Wash/dry face;Standing   Upper Body Bathing: Set up;Sitting   Lower Body Bathing: Minimal assistance;Sit to/from stand   Upper Body Dressing : Set up   Lower Body Dressing: Minimal assistance;Sit to/from stand   Toilet Transfer: Min guard;Ambulation   Toileting- Clothing Manipulation and Hygiene: Minimal assistance;Sit to/from stand Toileting - Clothing Manipulation Details (indicate cue type and reason): educated on AE and strategy to avoid twisting during pericare.  Tub/ Shower Transfer: Min guard;Ambulation;3 in 1   Functional mobility during ADLs: Min guard General ADL Comments: Pt completed toilet transfer, grooming task, inroom functional mobility, and bed mobility as detailed above. Difficulty elevating B feet to access for LB ADLs to avoid bending.      Vision         Perception     Praxis      Pertinent Vitals/Pain Pain Assessment: Faces Faces Pain Scale: Hurts a little bit Pain Location: incision (back) Pain Descriptors / Indicators: Discomfort Pain Intervention(s): Monitored during session;Repositioned;Limited activity within patient's tolerance     Hand Dominance Right   Extremity/Trunk Assessment Upper Extremity Assessment Upper Extremity Assessment: Overall WFL for tasks assessed   Lower Extremity Assessment Lower Extremity Assessment: Defer to PT evaluation       Communication Communication Communication: No difficulties   Cognition Arousal/Alertness: Awake/alert Behavior During Therapy: WFL for tasks assessed/performed Overall Cognitive Status: Within Functional Limits for tasks assessed  General Comments       Exercises     Shoulder Instructions      Home Living Family/patient expects to be discharged to:: Private residence Living Arrangements: Spouse/significant other Available Help at Discharge: Family;Available PRN/intermittently Type of Home:  House Home Access: Stairs to enter [employer] of Steps: 3 Entrance Stairs-Rails: None Home Layout: One level;Laundry or work area in basement     [employer] Shower/Tub: Occupational psychologist: Handicapped height Bathroom Accessibility: Yes   Home Equipment: Environmental consultant - 2 wheels;Cane - single point;Grab bars - tub/shower;Shower seat - built in          Prior Functioning/Environment Level of Independence: Independent                 OT Problem List: Impaired balance (sitting and/or standing);Decreased knowledge of use of DME or AE;Decreased knowledge of precautions;Pain      OT Treatment/Interventions: Self-care/ADL training;DME and/or AE instruction;Therapeutic activities;Patient/family education;Balance training    OT Goals(Current goals can be found in the care plan section) Acute Rehab OT Goals Patient Stated Goal: to go home OT Goal Formulation: With patient Time For Goal Achievement: 08/03/17 Potential to Achieve Goals: Good ADL Goals Pt Will Perform Lower Body Bathing: with modified independence;with adaptive equipment;sit to/from stand Pt Will Perform Lower Body Dressing: with modified independence;with adaptive equipment;sit to/from stand Pt Will Transfer to Toilet: with modified independence;ambulating Pt Will Perform Toileting - Clothing Manipulation and hygiene: with modified independence;sit to/from stand;with adaptive equipment Pt Will Perform Tub/Shower Transfer: with supervision;ambulating;shower seat  OT Frequency: Min 2X/week   Barriers to D/C:            Co-evaluation              AM-PAC PT "6 Clicks" Daily Activity     Outcome Measure Help from another person eating meals?: None Help from another person taking care of personal grooming?: None Help from another person toileting, which includes using toliet, bedpan, or urinal?: None Help from another person bathing (including washing, rinsing, drying)?: A Little Help from  another person to put on and taking off regular upper body clothing?: None Help from another person to put on and taking off regular lower body clothing?: A Little 6 Click Score: 22   End of Session    Activity Tolerance: Patient tolerated treatment well Patient left: in chair;with call bell/phone within reach  OT Visit Diagnosis: Pain                Time: 9323-5573 OT Time Calculation (min): 25 min Charges:  OT General Charges $OT Visit: 1 Visit OT Evaluation $OT Eval Low Complexity: 1 Low OT Treatments $Self Care/Home Management : 8-22 mins G-Codes:       Hortencia Pilar 07/27/2017, 1:41 PM

## 2017-07-27 NOTE — Progress Notes (Signed)
Subjective: Patient reports overall patient doing okay still has a moderate amount ba pain but improved from surgery  Objective: Vital signs in last 24 hours: Temp:  [97.3 F (36.3 C)-97.8 F (36.6 C)] 97.8 F (36.6 C) (10/06 0525) Pulse Rate:  [59-79] 60 (10/06 0525) Resp:  [14-25] 18 (10/06 0525) BP: (119-162)/(56-81) 137/63 (10/06 0525) SpO2:  [94 %-99 %] 98 % (10/06 0525) Weight:  [81.2 kg (179 lb)] 81.2 kg (179 lb) (10/05 0833)  Intake/Output from previous day: 10/05 0701 - 10/06 0700 In: 2530 [P.O.:480; I.V.:2050] Out: 1280 [Urine:1250; Blood:30] Intake/Output this shift: No intake/output data recorded.   strength 5 out of 5 wound clean dry and intact  Lab Results: No results for input(s): WBC, HGB, HCT, PLT in the last 72 hours. BMET No results for input(s): NA, K, CL, CO2, GLUCOSE, BUN, CREATININE, CALCIUM in the last 72 hours.  Studies/Results: Dg Lumbar Spine 2-3 Views  Result Date: 07/26/2017 CLINICAL DATA:  L4-5 laminectomy EXAM: LUMBAR SPINE - 2-3 VIEW COMPARISON:  Lumbar MRI May 17, 2017; lumbar radiographs Mar 19, 2017 FINDINGS: Initial cross-table lateral lumbar image labeled #1 shows a probe with tip posterior to the inferior aspect of the L4 vertebral body. There is pedicle screw fixation at L2, L3, L4 with disc spacer at L2-3. 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 No fracture or spondylolisthesis. Cross-table lateral lumbar image labeled #2 shows a metallic probe posterior to the L4-5 interspace. Postoperative changes of L2, L3, and L4 remain stable as does disc narrowing at L2-3 and L3-4. No fracture or spondylolisthesis. IMPRESSION: On final submitted cross-table lateral image, metallic probe tip is posterior to the L4-5 interspace. Postoperative changes at L2, L3, and L4 noted. Disc space narrowing at L2-3 and L3-4 present. No acute fracture or spondylolisthesis. Electronically Signed   By: Lowella Grip III M.D.   On: 07/26/2017 12:01     Assessment/Plan: Postop day 1 decompressive laminectomy patient slow to mobilize still doesn't feel comfortable going home continue to mobilize with physical occupational therapy involved.  LOS: 1 day     Andre Swander P 07/27/2017, 7:29 AM

## 2017-07-27 NOTE — Progress Notes (Signed)
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 S/p laminectomy L4-L5.  Clinical Impression  Evaluated patient and educated her on surgical precautions, bed mobility, transfers, and car transfers. She demonstrates good prognosis and capacity for return to prior level of function. Will continue to see acutely to address deficits and maximize function.     Follow Up Recommendations No PT follow up;Supervision - Intermittent    Equipment Recommendations  None recommended by PT    Recommendations for Other Services       Precautions / Restrictions Precautions Precautions: Back;Fall Precaution Booklet Issued: Yes (comment) Precaution Comments: reviewed 3/3 back precautions with patient Required Braces or Orthoses:  (no brace required - per MD) Restrictions Weight Bearing Restrictions: No      Mobility  Bed Mobility               General bed mobility comments: pt OOB in recliner upon arrival  Transfers Overall transfer level: Needs assistance Equipment used: None Transfers: Sit to/from Stand Sit to Stand: Supervision         General transfer comment: completed with no difficulty. supervision for safety  Ambulation/Gait Ambulation/Gait assistance: Modified independent (Device/Increase time) Ambulation Distance (Feet): 350 Feet Assistive device: None (pushed IV pole) Gait Pattern/deviations: Step-through pattern;Decreased stride length;Trunk flexed Gait velocity: decreased   General Gait Details: steady, cautious gait without significant LOB noted.   Stairs Stairs: Yes Stairs assistance: Supervision Stair Management: One rail Right;Alternating pattern Number of Stairs: 3 General stair comments: supervision for safety; unable to manage more secondary to IV  connection  Wheelchair Mobility    Modified Rankin (Stroke Patients Only)       Balance Overall balance assessment: Needs assistance Sitting-balance support: Feet supported;No upper extremity supported Sitting balance-Leahy Scale: Good     Standing balance support: During functional activity;Single extremity supported Standing balance-Leahy Scale: Good                               Pertinent Vitals/Pain Pain Assessment: Faces Faces Pain Scale: Hurts a little bit Pain Location: incision (back) Pain Descriptors / Indicators: Discomfort Pain Intervention(s): Monitored during session    Home Living Family/patient expects to be discharged to:: Private residence Living Arrangements: Spouse/significant other Available Help at Discharge: Family;Available PRN/intermittently Type of Home: House Home Access: Stairs to enter Entrance Stairs-Rails: None Entrance Stairs-Number of Steps: 3 Home Layout: One level;Laundry or work area in [place]: Environmental consultant - 2 wheels;Cane - single point;Grab bars - tub/shower      Prior Function Level of Independence: Independent               Hand Dominance   Dominant Hand: Right    Extremity/Trunk Assessment   Upper Extremity Assessment Upper Extremity Assessment: Defer to OT evaluation    Lower Extremity Assessment Lower Extremity Assessment: Overall WFL for tasks assessed       Communication   Communication: No difficulties  Cognition Arousal/Alertness: Awake/alert Behavior During Therapy: WFL for tasks assessed/performed Overall Cognitive Status: Within Functional Limits for tasks assessed                                        General Comments General  comments (skin integrity, edema, etc.): pt educated on back precautions, log roll technique, and transfers    Exercises     Assessment/Plan    PT Assessment Patient needs continued PT services  PT Problem List Decreased  balance;Decreased mobility       PT Treatment Interventions Gait training;Stair training;Functional mobility training;Therapeutic activities;Balance training;Neuromuscular re-education;Patient/family education    PT Goals (Current goals can be found in the Care Plan section)  Acute Rehab PT Goals Patient Stated Goal: to go home PT Goal Formulation: With patient Time For Goal Achievement: 08/10/17 Potential to Achieve Goals: Good    Frequency Min 5X/week   Barriers to discharge        Co-evaluation               AM-PAC PT "6 Clicks" Daily Activity  Outcome Measure Difficulty turning over in bed (including adjusting bedclothes, sheets and blankets)?: A Little Difficulty moving from lying on back to sitting on the side of the bed? : A Little Difficulty sitting down on and standing up from a chair with arms (e.g., wheelchair, bedside commode, etc,.)?: A Little Help needed moving to and from a bed to chair (including a wheelchair)?: A Little Help needed walking in hospital room?: A Little Help needed climbing 3-5 steps with a railing? : A Little 6 Click Score: 18    End of Session Equipment Utilized During Treatment: Gait belt Activity Tolerance: Patient tolerated treatment well Patient left: in chair;with call bell/phone within reach Nurse Communication: Mobility status PT Visit Diagnosis: Difficulty in walking, not elsewhere classified (R26.2)    Time: 0912-0927 PT Time Calculation (min) (ACUTE ONLY): 15 min   Charges:   PT Evaluation $PT Eval Low Complexity: 1 Low     PT G CodesSindy Guadeloupe, SPT [phone] office   Margarita Grizzle 07/27/2017, 9:51 AM

## 2017-07-28 MED ORDER — PANTOPRAZOLE SODIUM 40 MG PO TBEC
40.0000 mg | DELAYED_RELEASE_TABLET | Freq: Every day | ORAL | Status: DC
  Administered 2017-07-29 – 2017-07-30 (×2): 40 mg via ORAL
  Filled 2017-07-28 (×2): qty 1

## 2017-07-28 NOTE — Progress Notes (Signed)
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 S/p laminectomy L4-L5.    PT Comments    Patient is progressing toward mobility goals. Able to ambulate greater distance today without significant LOB noted. Reinforced back precautions, transfers, and mobility expectations at d/c. Patient verbalizes and demonstrates understanding with precautions and log roll technique. At this time no further acute PT services are needed.  PT will sign off.    Follow Up Recommendations  No PT follow up;Supervision - Intermittent     Equipment Recommendations  None recommended by PT    Recommendations for Other Services       Precautions / Restrictions Precautions Precautions: Back;Fall Precaution Comments: reviewed 3/3 back precautions with patient Restrictions Weight Bearing Restrictions: No    Mobility  Bed Mobility Overal bed mobility: Needs Assistance Bed Mobility: Rolling;Sidelying to Sit Rolling: Supervision Sidelying to sit: Supervision       General bed mobility comments: pt demonstrates good log roll technique with minimal use of bed rails during roll; supervision for safety and compliance with precautions  Transfers Overall transfer level: Modified independent Equipment used: None Transfers: Sit to/from Stand Sit to Stand: Modified independent (Device/Increase time)         General transfer comment: no difficulty or concern. 1x from bed, 1x from commode  Ambulation/Gait Ambulation/Gait assistance: Supervision Ambulation Distance (Feet): 420 Feet Assistive device: None Gait Pattern/deviations: Step-through pattern;Decreased stride length;Trunk flexed;Drifts right/left Gait velocity: decreased   General Gait Details: steady, cautious gait with no significant LOB noted.     Stairs             Wheelchair Mobility    Modified Rankin (Stroke Patients Only)       Balance Overall balance assessment: Needs assistance Sitting-balance support: Feet supported;No upper extremity supported Sitting balance-Leahy Scale: Good     Standing balance support: During functional activity;No upper extremity supported Standing balance-Leahy Scale: Good                              Cognition Arousal/Alertness: Awake/alert Behavior During Therapy: WFL for tasks assessed/performed Overall Cognitive Status: Within Functional Limits for tasks assessed                                        Exercises      General Comments General comments (skin integrity, edema, etc.): reinforced back precautions, car transfers and mobility expectations with patient and her husband.      Pertinent Vitals/Pain Pain Assessment: Faces Faces Pain Scale: Hurts a little bit Pain Location: incision (back) Pain Descriptors / Indicators: Discomfort Pain Intervention(s): Monitored during session    Home Living                      Prior Function            PT Goals (current goals can now be found in the care plan section) Acute Rehab PT Goals PT Goal Formulation: With patient Time For Goal Achievement: 08/10/17 Potential to Achieve Goals: Good Progress towards PT goals: Progressing toward goals    Frequency    Min 5X/week      PT Plan Current plan remains appropriate  Co-evaluation              AM-PAC PT "6 Clicks" Daily Activity  Outcome Measure  Difficulty turning over in bed (including adjusting bedclothes, sheets and blankets)?: A Little Difficulty moving from lying on back to sitting on the side of the bed? : A Little Difficulty sitting down on and standing up from a chair with arms (e.g., wheelchair, bedside commode, etc,.)?: None Help needed moving to and from a bed to chair (including a wheelchair)?: A Little Help needed  walking in hospital room?: A Little Help needed climbing 3-5 steps with a railing? : A Little 6 Click Score: 19    End of Session Equipment Utilized During Treatment: Gait belt Activity Tolerance: Patient tolerated treatment well Patient left: in chair;with call bell/phone within reach;with family/visitor present Nurse Communication: Mobility status PT Visit Diagnosis: Difficulty in walking, not elsewhere classified (R26.2)     Time: 1916-6060 PT Time Calculation (min) (ACUTE ONLY): 15 min  Charges:  $Gait Training: 8-22 mins                    G CodesSindy Guadeloupe, SPT [phone] office    Margarita Grizzle 07/28/2017, 3:05 PM

## 2017-07-28 NOTE — Progress Notes (Signed)
Neurosurgery Progress Note  No issues overnight. Pain is manageable Working with therapy  EXAM:  BP 137/68 (BP Location: Left Arm)   Pulse 62   Temp 98.5 F (36.9 C) (Oral)   Resp 16   Ht 5\' 2"  (1.575 m)   Wt 81.2 kg (179 lb)   SpO2 99%   BMI 32.74 kg/m   Awake, alert, oriented  Speech fluent, appropriate  5/5 BUE/BLE  Wound c/d/i  Plan Progressing nicely. Still does not feel comfortable going home.  One more night. D/C tomorrow

## 2017-07-29 NOTE — Progress Notes (Signed)
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 S/p laminectomy L4-L5.   OT comments  Pt progressing towards established OT goals. Provided pt with education and handout on AE for LB ADLs to adhere tp back precautions. Pt donned/doffed underwear and socks with AE, supervision, and educational VCs. Pt performed toilet and shower transfers demonstrating safe technique. Educated pt on safe toilet hygiene techniques to adhere to precautions; pt thankful and verbalized understanding. Continue to recommend dc home once medically ready per physician. All acute OT needs met and will sign off.    Follow Up Recommendations  No OT follow up    Equipment Recommendations  3 in 1 bedside commode    Recommendations for Other Services      Precautions / Restrictions Precautions Precautions: Back;Fall Precaution Comments: pt abel to recalled 3/3 back precautions Required Braces or Orthoses:  (no brace required - per MD) Restrictions Weight Bearing Restrictions: No       Mobility Bed Mobility Overal bed mobility: Needs Assistance Bed Mobility: Rolling;Sidelying to Sit Rolling: Supervision Sidelying to sit: Supervision       General bed mobility comments: pt demonstrates good log roll technique with minimal use of bed rails during roll; supervision for safety and compliance with precautions  Transfers Overall transfer level: Modified independent Equipment used: None Transfers: Sit to/from Stand Sit to Stand: Modified independent (Device/Increase time)         General transfer comment: no difficulty or concern. 1x from bed, 1x from commode    Balance Overall balance assessment: Needs assistance Sitting-balance support: Feet supported;No upper extremity supported Sitting balance-Leahy Scale:  Good     Standing balance support: During functional activity;No upper extremity supported Standing balance-Leahy Scale: Good                             ADL either performed or assessed with clinical judgement   ADL Overall ADL's : Needs assistance/impaired                 Upper Body Dressing : Set up   Lower Body Dressing: Sit to/from stand;With adaptive equipment;Cueing for sequencing;Supervision/safety;Set up   Toilet Transfer: Ambulation;BSC;Supervision/safety     Toileting - Clothing Manipulation Details (indicate cue type and reason): educated on AE and strategy to avoid twisting during pericare.  Tub/ Shower Transfer: Min guard;Ambulation;Walk-in shower;Grab bars;Cueing for sequencing   Functional mobility during ADLs: Min guard;Supervision/safety General ADL Comments: Provided education and handout on AE for LB ADLs and toileting. Pt dmeonstrating understanding of AE and donned underwear. Pt demonstrating safe technique for walk in shower.     Vision       Perception     Praxis      Cognition Arousal/Alertness: Awake/alert Behavior During Therapy: WFL for tasks assessed/performed Overall Cognitive Status: Within Functional Limits for tasks assessed                                          Exercises     Shoulder Instructions       General Comments Provided pt with handout on AE to increase carry over to home    Pertinent Vitals/ Pain  Pain Assessment: Faces Faces Pain Scale: Hurts a little bit Pain Location: incision (back) Pain Descriptors / Indicators: Discomfort Pain Intervention(s): Monitored during session  Home Living                                          Prior Functioning/Environment              Frequency  Min 2X/week        Progress Toward Goals  OT Goals(current goals can now be found in the care plan section)  Progress towards OT goals: Progressing toward  goals  Acute Rehab OT Goals Patient Stated Goal: to go home OT Goal Formulation: With patient Time For Goal Achievement: 08/03/17 Potential to Achieve Goals: Good ADL Goals Pt Will Perform Lower Body Bathing: with modified independence;with adaptive equipment;sit to/from stand Pt Will Perform Lower Body Dressing: with modified independence;with adaptive equipment;sit to/from stand Pt Will Transfer to Toilet: with modified independence;ambulating Pt Will Perform Toileting - Clothing Manipulation and hygiene: with modified independence;sit to/from stand;with adaptive equipment Pt Will Perform Tub/Shower Transfer: with supervision;ambulating;shower seat  Plan Discharge plan remains appropriate    Co-evaluation                 AM-PAC PT "6 Clicks" Daily Activity     Outcome Measure   Help from another person eating meals?: None Help from another person taking care of personal grooming?: None Help from another person toileting, which includes using toliet, bedpan, or urinal?: None Help from another person bathing (including washing, rinsing, drying)?: A Little Help from another person to put on and taking off regular upper body clothing?: None Help from another person to put on and taking off regular lower body clothing?: A Little 6 Click Score: 22    End of Session Equipment Utilized During Treatment: Gait belt  OT Visit Diagnosis: Pain Pain - Right/Left:  (Back) Pain - part of body:  (back)   Activity Tolerance Patient tolerated treatment well   Patient Left with call bell/phone within reach;in bed;with nursing/sitter in room   Nurse Communication Mobility status        Time: 1016-1030 OT Time Calculation (min): 14 min  Charges: OT General Charges $OT Visit: 1 Visit OT Treatments $Self Care/Home Management : 8-22 mins  [place], OTR/L Acute Rehab Pager: [phone] Office: [place] 07/29/2017, 11:51 AM

## 2017-07-29 NOTE — Progress Notes (Signed)
Patient ID: Victoria Molina, female   DOB: 1945-12-25, 71 y.o.   MRN: [id number] BP (!) 112/55 (BP Location: Right Arm)   Pulse 70   Temp 98.9 F (37.2 C) (Oral)   Resp 18   Ht 5\' 2"  (1.575 m)   Wt 81.2 kg (179 lb)   SpO2 98%   BMI 32.74 kg/m  Alert and oriented x 4, speech is clear and fluent Will discharge home tomorrow Wound is clean, dry, without signs of infection

## 2017-07-29 NOTE — Progress Notes (Signed)
Patient has pain that is controlled by tylenol she has received 2000 Mg over the last 24 hours, she is ad-lib in her room able to get in and out of bed without assistance as well as to the bathroom, will give her Protonix at 0700 proximately 30 min prior to breakfast.

## 2017-07-29 NOTE — Care Management Note (Signed)
Case Management Note  Patient Details  Name: DANAH REINECKE MRN: [id number] Date of Birth: 09/23/1946  Subjective/Objective:    Pt underwent:  LAMINECTOMY LUMBAR FOUR - LUMBAR FIVE.  She is from home with her spouse.            Action/Plan: No f/u per PT/OT. Pt with orders for 3 in 1. Jermaine with [facility] DME notified and delivered the equipment to the room. Pt has transportation home.   Expected Discharge Date:                  Expected Discharge Plan:  Home/Self Care  In-House Referral:     Discharge planning Services  CM Consult  Post Acute Care Choice:  Durable Medical Equipment Choice offered to:  Patient  DME Arranged:  3-N-1 DME Agency:  [place]:    [place] Agency:     Status of Service:  Completed, signed off  If discussed at [place] of Stay Meetings, dates discussed:    Additional Comments:  Pollie Friar, RN 07/29/2017, 11:00 AM

## 2017-07-30 NOTE — Care Management Important Message (Signed)
Important Message  Patient Details  Name: Victoria Molina MRN: [id number] Date of Birth: 14-Feb-1946   Medicare Important Message Given:  Yes    Nathen May 07/30/2017, 9:31 AM

## 2017-07-30 NOTE — Discharge Instructions (Signed)
Lumbar Discectomy °Care After °A discectomy involves removal of discmaterial (the cartilage-like structures located between the bones of the back). It is done to relieve pressure on nerve roots. It can be used as a treatment for a back problem. The time in surgery depends on the findings in surgery and what is necessary to correct the problems. °HOME CARE INSTRUCTIONS  °· Check the cut (incision) made by the surgeon twice a day for signs of infection. Some signs of infection may include:  °· A foul smelling, greenish or yellowish discharge from the wound.  °· Increased pain.  °· Increased redness over the incision (operative) site.  °· The skin edges may separate.  °· Flu-like symptoms (problems).  °· A temperature above 101.5° F (38.6° C).  °· Change your bandages in about 24 to 36 hours following surgery or as directed.  °· You may shower tomrrow.  Avoid bathtubs, swimming pools and hot tubs for three weeks or until your incision has healed completely. °· Follow your doctor's instructions as to safe activities, exercises, and physical therapy.  °· Weight reduction may be beneficial if you are overweight.  °· Daily exercise is helpful to prevent the return of problems. Walking is permitted. You may use a treadmill without an incline. Cut down on activities and exercise if you have discomfort. You may also go up and down stairs as much as you can tolerate.  °· DO NOT lift anything heavier than 10 to 15 lbs. Avoid bending or twisting at the waist. Always bend your knees when lifting.  °· Maintain strength and range of motion as instructed.  °· Do not drive for 10 days, or as directed by your doctors. You may be a passenger . Lying back in the passenger seat may be more comfortable for you. Always wear a seatbelt.  °· Limit your sitting in a regular chair to 20 to 30 minutes at a time. There are no limitations for sitting in a recliner. You should lie down or walk in between sitting periods.  °· Only take  over-the-counter or prescription medicines for pain, discomfort, or fever as directed by your caregiver.  °SEEK MEDICAL CARE IF:  °· There is increased bleeding (more than a small spot) from the wound.  °· You notice redness, swelling, or increasing pain in the wound.  °· Pus is coming from wound.  °· You develop an unexplained oral temperature above 102° F (38.9° C) develops.  °· You notice a foul smell coming from the wound or dressing.  °· You have increasing pain in your wound.  °SEEK IMMEDIATE MEDICAL CARE IF:  °· You develop a rash.  °· You have difficulty breathing.  °· You develop any allergic problems to medicines given.  °Document Released: 09/12/2004 Document Revised: 09/27/2011 Document Reviewed: 01/01/2008 °ExitCare® Patient Information °

## 2017-07-30 NOTE — Progress Notes (Signed)
D/c reviewed with patient and spouse. Patient is encouraged to call and make a follow up appointment with Neurosurgery. No further questions at this time

## 2017-08-02 NOTE — Discharge Summary (Signed)
Physician Discharge Summary  Patient ID: Victoria Molina MRN: [id number] DOB/AGE: 73-Dec-1947 73 y.o.  Admit date: 07/26/2017 Discharge date: 08/02/2017  Admission Diagnoses:Lumbar stenosis with neurogenic claudication  Discharge Diagnoses: same Active Problems:   Lumbar stenosis with neurogenic claudication   Discharged Condition: good  Hospital Course: Victoria Molina was admitted and taken to the operating room for an uncomplicated lumbar decompression at L4/5. Post op she was ambulating, voiding, and tolerating a regular diet. Her wound is clean, dry,and without signs of infection. She was discharged doing very well.   Treatments: surgery: LAMINECTOMY LUMBAR FOUR - LUMBAR FIVE   Discharge Exam: Blood pressure (!) 124/59, pulse 80, temperature 97.9 F (36.6 C), temperature source Oral, resp. rate 18, height 5\' 2"  (1.575 m), weight 81.2 kg (179 lb), SpO2 97 %. General appearance: alert, cooperative, appears stated age and no distress Neurologic: Alert and oriented X 3, normal strength and tone. Normal symmetric reflexes. Normal coordination and gait  Disposition: 01-Home or Self Care LUMBAR STENOSIS WITH NEUROGENIC CLAUDICATION  Allergies as of 07/30/2017      Reactions   Iodinated Diagnostic Agents Hives, Itching   Pt pre-meds with Benadryl 50mg  PO; tolerates ESI's.  jkl Pt pre-meds with Benadryl 50mg  PO; tolerates ESI's.  jkl   Percocet [oxycodone-acetaminophen] Other (See Comments)   hallucinations   Sulfa Antibiotics Hives, Itching, Rash   Contrast Media [iodinated Diagnostic Agents] Rash   Iodine Rash   Iodine-131 Rash   Sulfamethoxazole Rash   Vioxx [rofecoxib] Swelling   FOOT AND LEG Swelling of legs and feet      Medication List    TAKE these medications   ALPRAZolam 0.5 MG tablet Commonly known as:  XANAX Take 0.5 mg by mouth 3 (three) times daily as needed (for anxiety/claustrophobia).   amLODipine 5 MG tablet Commonly known as:  NORVASC Take 5 mg by mouth  daily.   CHOLEST CARE PO Take 1 tablet by mouth daily.   CINNAMON PO Take 1,000 mg by mouth daily.   COSAMIN DS PO Take 1 tablet by mouth daily.   cyclobenzaprine 5 MG tablet Commonly known as:  FLEXERIL Take 2 tablets (10 mg total) by mouth 3 (three) times daily as needed for muscle spasms.   Fish Oil 1200 MG Caps Take 1,200 mg by mouth daily.   isosorbide mononitrate 30 MG 24 hr tablet Commonly known as:  IMDUR Take 30 mg by mouth daily.   LEG VEIN & CIRCULATION PO Take 1 tablet by mouth daily.   loratadine 10 MG tablet Commonly known as:  CLARITIN Take 10 mg by mouth daily.   meloxicam 7.5 MG tablet Commonly known as:  MOBIC Take 1 tablet (7.5 mg total) by mouth daily.   multivitamin with minerals tablet Take 1 tablet by mouth daily.   omeprazole 20 MG capsule Commonly known as:  PRILOSEC TAKE ONE CAPSULE BY MOUTH ONCE DAILY BEFORE BREAKFAST   polyethylene glycol powder powder Commonly known as:  GLYCOLAX/MIRALAX MIX 17 GRAMS IN LIQUID AND DRINK DAILY AS NEEDED FOR CONSTIPATION   Turmeric Curcumin 500 MG Caps Take 500 mg by mouth daily.   vitamin B-12 1000 MCG tablet Commonly known as:  CYANOCOBALAMIN Take 1,000 mcg by mouth daily.      Follow-up Information    Ashok Pall, MD Follow up in 3 week(s).   Specialty:  Neurosurgery Why:  please call the office to make an appointment Contact information: [address] [place] [address] [phone]  Signed: Livian Vanderbeck L 08/02/2017, 2:58 PM

## 2017-09-06 ENCOUNTER — Other Ambulatory Visit: Payer: Self-pay | Admitting: Neurosurgery

## 2017-09-06 DIAGNOSIS — M48062 Spinal stenosis, lumbar region with neurogenic claudication: Secondary | ICD-10-CM

## 2017-09-21 ENCOUNTER — Ambulatory Visit
Admission: RE | Admit: 2017-09-21 | Discharge: 2017-09-21 | Disposition: A | Discharge status: Home / Self Care | Payer: Medicare Other | Source: Ambulatory Visit | Attending: Neurosurgery | Admitting: Neurosurgery

## 2017-09-21 DIAGNOSIS — M48062 Spinal stenosis, lumbar region with neurogenic claudication: Secondary | ICD-10-CM

## 2017-09-21 MED ORDER — GADOBENATE DIMEGLUMINE 529 MG/ML IV SOLN
15.0000 mL | Freq: Once | INTRAVENOUS | Status: AC | PRN
  Administered 2017-09-21: 15 mL via INTRAVENOUS

## 2017-10-08 ENCOUNTER — Telehealth: Payer: Self-pay | Admitting: Gastroenterology

## 2017-10-08 MED ORDER — LINACLOTIDE 72 MCG PO CAPS: 72.0000 ug | ORAL_CAPSULE | Freq: Every day | ORAL | 11 refills | Status: DC

## 2017-10-08 NOTE — Addendum Note (Signed)
Addended by: Danie Binder on: 10/08/2017 05:02 PM   Modules accepted: Orders

## 2017-10-08 NOTE — Telephone Encounter (Signed)
PLEASE CALL PT. TO PREVENT CONSTIPATION: DRINK WATER TO KEEP YOUR URINE LIGHT YELLOW. FOLLOW A HIGH FIBER DIET. AVOID ITEMS THAT CAUSE BLOATING & GAS. TAKE LINZESS 72 MCG DAILY. IF IT CAUSES EXPLOSIVE WATERY DIARRHEA, OPEN LINZESS CAPSULE. PLACE IN 4 TSP WATER. STIR IT FOR 30 SECONDS AND TAKE 2 TSP LIQUID. USE MIRALAX OR MOM IF NEEDED TO PREVENT CONSTIPATION. HOLD MEDS FOR DIARRHEA.

## 2017-10-08 NOTE — Telephone Encounter (Signed)
[phone]  Please call patient, she is having bad constipation and mirilax is not working, is there something else she can try

## 2017-10-08 NOTE — Telephone Encounter (Signed)
Pt is taking Miralax once daily and not having a BM everyday. Sometimes she will have 2 Bm's and then the next day she may have a BM and it will be very hard. Please advise!

## 2017-10-08 NOTE — Telephone Encounter (Signed)
LMOM to call.

## 2017-10-09 NOTE — Telephone Encounter (Signed)
Linzess 72 mcg #12 at front for pick up when pt returns call.

## 2017-10-09 NOTE — Telephone Encounter (Signed)
Pt is aware and will come by the office to pick up the samples.

## 2017-10-09 NOTE — Telephone Encounter (Signed)
LMOM to call.

## 2017-11-05 ENCOUNTER — Other Ambulatory Visit: Payer: Self-pay | Admitting: Neurosurgery

## 2017-11-08 NOTE — Pre-Procedure Instructions (Signed)
Victoria Molina   11/08/2017     Your procedure is scheduled on  Wednesday, November 13, 2017 at 10:00 AM.   Report to [facility] Entrance "A" Admitting Office at 8:00 AM.   Call this number if you have problems the morning of surgery: [phone]   Questions prior to day of surgery, please call [phone] between 8 & 4 PM.   Remember:  Do not eat food or drink liquids after midnight Tuesday, 11/12/17.  Take these medicines the morning of surgery with A SIP OF WATER: Amlodipine (Norvasc), Isosorbide Mononitrate (Imdur), Loratadine (Claritin), Omeprazole (Prilosec), Alprazolam (Xanax) - if needed, Tylenol - if needed  Stop Herbal medications, Multivitamins, NSAIDS (Meloxicam, Ibuprofen, Aleve, etc) and Fish oil as of today. Do not use Aspirin products prior to surgery.   Do not wear jewelry, make-up or nail polish.  Do not wear lotions, powders, perfumes, or deodorant.  Do not shave 48 hours prior to surgery.    Do not bring valuables to the hospital.  [facility] is not responsible for any belongings or valuables.  Contacts, dentures or bridgework may not be worn into surgery.  Leave your suitcase in the car.  After surgery it may be brought to your room.  For patients admitted to the hospital, discharge time will be determined by your treatment team.  [facility] - Preparing for Surgery  Before surgery, you can play an important role.  Because skin is not sterile, your skin needs to be as free of germs as possible.  You can reduce the number of germs on you skin by washing with CHG (chlorahexidine gluconate) soap before surgery.  CHG is an antiseptic cleaner which kills germs and bonds with the skin to continue killing germs even after washing.  Please DO NOT use if you have an allergy to CHG or antibacterial soaps.  If your skin becomes reddened/irritated stop using the CHG and inform your nurse when you arrive at Short Stay.  Do not shave (including legs and underarms)  for at least 48 hours prior to the first CHG shower.  You may shave your face.  Please follow these instructions carefully:   1.  Shower with CHG Soap the night before surgery and the                    morning of Surgery.  2.  If you choose to wash your hair, wash your hair first as usual with your       normal shampoo.  3.  After you shampoo, rinse your hair and body thoroughly to remove the shampoo.  4.  Use CHG as you would any other liquid soap.  You can apply chg directly       to the skin and wash gently with scrungie or a clean washcloth.  5.  Apply the CHG Soap to your body ONLY FROM THE NECK DOWN.        Do not use on open wounds or open sores.  Avoid contact with your eyes, ears, mouth and genitals (private parts).  Wash genitals (private parts) with your normal soap.  6.  Wash thoroughly, paying special attention to the area where your surgery        will be performed.  7.  Thoroughly rinse your body with warm water from the neck down.  8.  DO NOT shower/wash with your normal soap after using and rinsing off       the CHG Soap.  9.  Pat yourself dry with a clean towel.            10.  Wear clean pajamas.            11.  Place clean sheets on your bed the night of your first shower and do not        sleep with pets.  Day of Surgery  Shower as above. Do not apply any lotions/deoderants the morning of surgery.  Please wear clean clothes to the hospital.   Please read over the fact sheets that you were given.

## 2017-11-11 ENCOUNTER — Encounter (HOSPITAL_COMMUNITY): Payer: Self-pay

## 2017-11-11 ENCOUNTER — Encounter (HOSPITAL_COMMUNITY)
Admission: RE | Admit: 2017-11-11 | Discharge: 2017-11-11 | Disposition: A | Discharge status: Home / Self Care | Payer: Medicare Other | Source: Ambulatory Visit | Attending: Neurosurgery | Admitting: Neurosurgery

## 2017-11-11 ENCOUNTER — Other Ambulatory Visit: Payer: Self-pay

## 2017-11-11 DIAGNOSIS — Z01812 Encounter for preprocedural laboratory examination: Secondary | ICD-10-CM

## 2017-11-11 LAB — BASIC METABOLIC PANEL
Anion gap: 10 (ref 5–15)
BUN: 11 mg/dL (ref 6–20)
CO2: 27 mmol/L (ref 22–32)
Calcium: 9.9 mg/dL (ref 8.9–10.3)
Chloride: 104 mmol/L (ref 101–111)
Creatinine, Ser: 0.78 mg/dL (ref 0.44–1.00)
GFR calc Af Amer: >60 mL/min (ref >60)
GFR calc non Af Amer: >60 mL/min (ref >60)
Glucose, Bld: 120 mg/dL — ABNORMAL HIGH (ref 65–99)
Potassium: 3.6 mmol/L (ref 3.5–5.1)
Sodium: 141 mmol/L (ref 135–145)

## 2017-11-11 LAB — CBC
HCT: 41.4 % (ref 36.0–46.0)
Hemoglobin: 14.1 g/dL (ref 12.0–15.0)
MCH: 29.9 pg (ref 26.0–34.0)
MCHC: 34.1 g/dL (ref 30.0–36.0)
MCV: 87.7 fL (ref 78.0–100.0)
Platelets: 336 10*3/uL (ref 150–400)
RBC: 4.72 MIL/uL (ref 3.87–5.11)
RDW: 13.4 % (ref 11.5–15.5)
WBC: 8 10*3/uL (ref 4.0–10.5)

## 2017-11-11 LAB — SURGICAL PCR SCREEN
MRSA, PCR: NEGATIVE
Staphylococcus aureus: NEGATIVE

## 2017-11-11 LAB — ABO/RH: ABO/RH(D): A POS

## 2017-11-11 NOTE — Progress Notes (Signed)
Pt denies cardiac history, chest pain or sob. Pt states she is not diabetic. 

## 2017-11-12 LAB — TYPE AND SCREEN
ABO/RH(D): A POS
Antibody Screen: NEGATIVE

## 2017-11-13 ENCOUNTER — Inpatient Hospital Stay (HOSPITAL_COMMUNITY): Payer: Medicare Other

## 2017-11-13 ENCOUNTER — Inpatient Hospital Stay (HOSPITAL_COMMUNITY): Payer: Medicare Other | Admitting: Anesthesiology

## 2017-11-13 ENCOUNTER — Encounter (HOSPITAL_COMMUNITY): Payer: Self-pay | Admitting: Certified Registered Nurse Anesthetist

## 2017-11-13 ENCOUNTER — Encounter (HOSPITAL_COMMUNITY)
Admission: RE | Disposition: A | Discharge status: Home / Self Care | Payer: Self-pay | Source: Ambulatory Visit | Attending: Neurosurgery

## 2017-11-13 ENCOUNTER — Other Ambulatory Visit: Payer: Self-pay

## 2017-11-13 ENCOUNTER — Inpatient Hospital Stay (HOSPITAL_COMMUNITY)
Admission: RE | Admit: 2017-11-13 | Discharge: 2017-11-19 | DRG: 460 | Disposition: A | Discharge status: Home / Self Care | Payer: Medicare Other | Source: Ambulatory Visit | Attending: Neurosurgery | Admitting: Neurosurgery

## 2017-11-13 DIAGNOSIS — Z888 Allergy status to other drugs, medicaments and biological substances status: Secondary | ICD-10-CM | POA: Diagnosis not present

## 2017-11-13 DIAGNOSIS — Z91041 Radiographic dye allergy status: Secondary | ICD-10-CM | POA: Diagnosis not present

## 2017-11-13 DIAGNOSIS — I10 Essential (primary) hypertension: Secondary | ICD-10-CM | POA: Diagnosis present

## 2017-11-13 DIAGNOSIS — Z419 Encounter for procedure for purposes other than remedying health state, unspecified: Secondary | ICD-10-CM

## 2017-11-13 DIAGNOSIS — M48061 Spinal stenosis, lumbar region without neurogenic claudication: Secondary | ICD-10-CM | POA: Diagnosis present

## 2017-11-13 DIAGNOSIS — E669 Obesity, unspecified: Secondary | ICD-10-CM | POA: Diagnosis present

## 2017-11-13 DIAGNOSIS — Z882 Allergy status to sulfonamides status: Secondary | ICD-10-CM

## 2017-11-13 DIAGNOSIS — K219 Gastro-esophageal reflux disease without esophagitis: Secondary | ICD-10-CM | POA: Diagnosis present

## 2017-11-13 DIAGNOSIS — F40240 Claustrophobia: Secondary | ICD-10-CM | POA: Diagnosis present

## 2017-11-13 DIAGNOSIS — Z87891 Personal history of nicotine dependence: Secondary | ICD-10-CM | POA: Diagnosis not present

## 2017-11-13 DIAGNOSIS — F419 Anxiety disorder, unspecified: Secondary | ICD-10-CM | POA: Diagnosis present

## 2017-11-13 DIAGNOSIS — K5900 Constipation, unspecified: Secondary | ICD-10-CM | POA: Diagnosis present

## 2017-11-13 DIAGNOSIS — Z23 Encounter for immunization: Secondary | ICD-10-CM

## 2017-11-13 DIAGNOSIS — Z6834 Body mass index (BMI) 34.0-34.9, adult: Secondary | ICD-10-CM

## 2017-11-13 DIAGNOSIS — Z79899 Other long term (current) drug therapy: Secondary | ICD-10-CM

## 2017-11-13 DIAGNOSIS — Z981 Arthrodesis status: Secondary | ICD-10-CM | POA: Diagnosis not present

## 2017-11-13 DIAGNOSIS — M1990 Unspecified osteoarthritis, unspecified site: Secondary | ICD-10-CM | POA: Diagnosis present

## 2017-11-13 DIAGNOSIS — G8929 Other chronic pain: Secondary | ICD-10-CM | POA: Diagnosis present

## 2017-11-13 DIAGNOSIS — K449 Diaphragmatic hernia without obstruction or gangrene: Secondary | ICD-10-CM | POA: Diagnosis present

## 2017-11-13 DIAGNOSIS — M7138 Other bursal cyst, other site: Secondary | ICD-10-CM | POA: Diagnosis present

## 2017-11-13 DIAGNOSIS — Z885 Allergy status to narcotic agent status: Secondary | ICD-10-CM

## 2017-11-13 HISTORY — PX: POSTERIOR LUMBAR FUSION: SHX6036

## 2017-11-13 SURGERY — POSTERIOR LUMBAR FUSION 1 LEVEL
Anesthesia: General | Site: Spine Lumbar

## 2017-11-13 MED ORDER — MIDAZOLAM HCL 2 MG/2ML IJ SOLN
INTRAMUSCULAR | Status: AC
  Filled 2017-11-13: qty 2

## 2017-11-13 MED ORDER — ONDANSETRON HCL 4 MG PO TABS
4.0000 mg | ORAL_TABLET | Freq: Four times a day (QID) | ORAL | Status: DC | PRN
  Administered 2017-11-14: 4 mg via ORAL
  Filled 2017-11-13: qty 1

## 2017-11-13 MED ORDER — BISACODYL 5 MG PO TBEC
5.0000 mg | DELAYED_RELEASE_TABLET | Freq: Every day | ORAL | Status: DC | PRN
  Administered 2017-11-17: 5 mg via ORAL
  Filled 2017-11-13: qty 1

## 2017-11-13 MED ORDER — GABAPENTIN 300 MG PO CAPS
300.0000 mg | ORAL_CAPSULE | Freq: Three times a day (TID) | ORAL | Status: DC
  Administered 2017-11-13 – 2017-11-19 (×18): 300 mg via ORAL
  Filled 2017-11-13 (×18): qty 1

## 2017-11-13 MED ORDER — ONDANSETRON HCL 4 MG/2ML IJ SOLN
INTRAMUSCULAR | Status: AC
  Filled 2017-11-13: qty 2

## 2017-11-13 MED ORDER — THROMBIN (RECOMBINANT) 20000 UNITS EX SOLR
CUTANEOUS | Status: DC | PRN
  Administered 2017-11-13: 20 mL via TOPICAL

## 2017-11-13 MED ORDER — SODIUM CHLORIDE 0.9 % IV SOLN: 250.0000 mL | INTRAVENOUS | Status: DC

## 2017-11-13 MED ORDER — PANTOPRAZOLE SODIUM 40 MG PO TBEC
40.0000 mg | DELAYED_RELEASE_TABLET | Freq: Every day | ORAL | Status: DC
  Administered 2017-11-13 – 2017-11-19 (×7): 40 mg via ORAL
  Filled 2017-11-13 (×7): qty 1

## 2017-11-13 MED ORDER — THROMBIN 20000 UNITS EX SOLR
CUTANEOUS | Status: AC
  Filled 2017-11-13: qty 20000

## 2017-11-13 MED ORDER — PHENOL 1.4 % MT LIQD: 1.0000 | OROMUCOSAL | Status: DC | PRN

## 2017-11-13 MED ORDER — TURMERIC CURCUMIN 500 MG PO CAPS: 500.0000 mg | ORAL_CAPSULE | Freq: Every day | ORAL | Status: DC

## 2017-11-13 MED ORDER — ADULT MULTIVITAMIN W/MINERALS CH
1.0000 | ORAL_TABLET | Freq: Every day | ORAL | Status: DC
  Administered 2017-11-14 – 2017-11-19 (×6): 1 via ORAL
  Filled 2017-11-13 (×8): qty 1

## 2017-11-13 MED ORDER — ROCURONIUM BROMIDE 10 MG/ML (PF) SYRINGE
PREFILLED_SYRINGE | INTRAVENOUS | Status: DC | PRN
  Administered 2017-11-13: 20 mg via INTRAVENOUS
  Administered 2017-11-13: 50 mg via INTRAVENOUS

## 2017-11-13 MED ORDER — PROMETHAZINE HCL 25 MG/ML IJ SOLN
INTRAMUSCULAR | Status: AC
  Filled 2017-11-13: qty 1

## 2017-11-13 MED ORDER — FENTANYL CITRATE (PF) 250 MCG/5ML IJ SOLN
INTRAMUSCULAR | Status: AC
  Filled 2017-11-13: qty 5

## 2017-11-13 MED ORDER — ACETAMINOPHEN 325 MG PO TABS
650.0000 mg | ORAL_TABLET | ORAL | Status: DC | PRN
  Administered 2017-11-15 – 2017-11-18 (×5): 650 mg via ORAL
  Filled 2017-11-13 (×5): qty 2

## 2017-11-13 MED ORDER — POTASSIUM CHLORIDE IN NACL 20-0.9 MEQ/L-% IV SOLN
INTRAVENOUS | Status: DC
  Administered 2017-11-13 – 2017-11-14 (×2): via INTRAVENOUS
  Filled 2017-11-13 (×2): qty 1000

## 2017-11-13 MED ORDER — FENTANYL CITRATE (PF) 250 MCG/5ML IJ SOLN
INTRAMUSCULAR | Status: AC
  Filled 2017-11-13: qty 10

## 2017-11-13 MED ORDER — MORPHINE SULFATE (PF) 2 MG/ML IV SOLN: 2.0000 mg | INTRAVENOUS | Status: DC | PRN

## 2017-11-13 MED ORDER — DOCUSATE SODIUM 100 MG PO CAPS
100.0000 mg | ORAL_CAPSULE | Freq: Two times a day (BID) | ORAL | Status: DC
  Administered 2017-11-13 – 2017-11-19 (×12): 100 mg via ORAL
  Filled 2017-11-13 (×12): qty 1

## 2017-11-13 MED ORDER — ROCURONIUM BROMIDE 10 MG/ML (PF) SYRINGE
PREFILLED_SYRINGE | INTRAVENOUS | Status: AC
  Filled 2017-11-13: qty 5

## 2017-11-13 MED ORDER — MAGNESIUM CITRATE PO SOLN
1.0000 | Freq: Once | ORAL | Status: AC | PRN
  Administered 2017-11-15: 1 via ORAL
  Filled 2017-11-13: qty 296

## 2017-11-13 MED ORDER — CEFAZOLIN SODIUM-DEXTROSE 2-4 GM/100ML-% IV SOLN
2.0000 g | INTRAVENOUS | Status: AC
  Administered 2017-11-13: 2 g via INTRAVENOUS
  Filled 2017-11-13: qty 100

## 2017-11-13 MED ORDER — OMEGA-3-ACID ETHYL ESTERS 1 G PO CAPS
1.0000 g | ORAL_CAPSULE | Freq: Every day | ORAL | Status: DC
  Administered 2017-11-14 – 2017-11-19 (×6): 1 g via ORAL
  Filled 2017-11-13 (×7): qty 1

## 2017-11-13 MED ORDER — DEXAMETHASONE SODIUM PHOSPHATE 10 MG/ML IJ SOLN
INTRAMUSCULAR | Status: DC | PRN
  Administered 2017-11-13: 10 mg via INTRAVENOUS

## 2017-11-13 MED ORDER — FENTANYL CITRATE (PF) 100 MCG/2ML IJ SOLN
INTRAMUSCULAR | Status: AC
  Filled 2017-11-13: qty 2

## 2017-11-13 MED ORDER — ISOSORBIDE MONONITRATE ER 30 MG PO TB24
30.0000 mg | ORAL_TABLET | Freq: Every day | ORAL | Status: DC
  Administered 2017-11-14 – 2017-11-19 (×6): 30 mg via ORAL
  Filled 2017-11-13 (×6): qty 1

## 2017-11-13 MED ORDER — ACETAMINOPHEN 650 MG RE SUPP: 650.0000 mg | RECTAL | Status: DC | PRN

## 2017-11-13 MED ORDER — SODIUM CHLORIDE 0.9% FLUSH
3.0000 mL | Freq: Two times a day (BID) | INTRAVENOUS | Status: DC
  Administered 2017-11-14 – 2017-11-18 (×5): 3 mL via INTRAVENOUS

## 2017-11-13 MED ORDER — PROPOFOL 10 MG/ML IV BOLUS
INTRAVENOUS | Status: AC
  Filled 2017-11-13: qty 40

## 2017-11-13 MED ORDER — NEOSTIGMINE METHYLSULFATE 5 MG/5ML IV SOSY
PREFILLED_SYRINGE | INTRAVENOUS | Status: AC
  Filled 2017-11-13: qty 5

## 2017-11-13 MED ORDER — LORATADINE 10 MG PO TABS
10.0000 mg | ORAL_TABLET | Freq: Every day | ORAL | Status: DC
  Administered 2017-11-13 – 2017-11-19 (×7): 10 mg via ORAL
  Filled 2017-11-13 (×7): qty 1

## 2017-11-13 MED ORDER — HYDROCODONE-ACETAMINOPHEN 5-325 MG PO TABS: 2.0000 | ORAL_TABLET | ORAL | Status: DC | PRN

## 2017-11-13 MED ORDER — GLYCOPYRROLATE 0.2 MG/ML IV SOSY
PREFILLED_SYRINGE | INTRAVENOUS | Status: DC | PRN
  Administered 2017-11-13: 0.4 mg via INTRAVENOUS

## 2017-11-13 MED ORDER — CHLORHEXIDINE GLUCONATE CLOTH 2 % EX PADS: 6.0000 | MEDICATED_PAD | Freq: Once | CUTANEOUS | Status: DC

## 2017-11-13 MED ORDER — HEMOSTATIC AGENTS (NO CHARGE) OPTIME
TOPICAL | Status: DC | PRN
  Administered 2017-11-13: 1 via TOPICAL

## 2017-11-13 MED ORDER — PROPOFOL 10 MG/ML IV BOLUS
INTRAVENOUS | Status: DC | PRN
  Administered 2017-11-13: 90 mg via INTRAVENOUS
  Administered 2017-11-13: 10 mg via INTRAVENOUS

## 2017-11-13 MED ORDER — ONDANSETRON HCL 4 MG/2ML IJ SOLN
4.0000 mg | Freq: Four times a day (QID) | INTRAMUSCULAR | Status: DC | PRN
  Administered 2017-11-14: 4 mg via INTRAVENOUS
  Filled 2017-11-13: qty 2

## 2017-11-13 MED ORDER — LIDOCAINE-EPINEPHRINE 0.5 %-1:200000 IJ SOLN
INTRAMUSCULAR | Status: AC
  Filled 2017-11-13: qty 1

## 2017-11-13 MED ORDER — ONDANSETRON HCL 4 MG/2ML IJ SOLN
4.0000 mg | Freq: Once | INTRAMUSCULAR | Status: AC
  Administered 2017-11-13: 4 mg via INTRAVENOUS

## 2017-11-13 MED ORDER — ACETAMINOPHEN 500 MG PO TABS
1000.0000 mg | ORAL_TABLET | Freq: Four times a day (QID) | ORAL | Status: AC
  Administered 2017-11-13 – 2017-11-14 (×4): 1000 mg via ORAL
  Filled 2017-11-13 (×4): qty 2

## 2017-11-13 MED ORDER — VITAMIN B-12 1000 MCG PO TABS
1000.0000 ug | ORAL_TABLET | Freq: Every day | ORAL | Status: DC
  Administered 2017-11-14 – 2017-11-19 (×6): 1000 ug via ORAL
  Filled 2017-11-13 (×7): qty 1

## 2017-11-13 MED ORDER — FENTANYL CITRATE (PF) 100 MCG/2ML IJ SOLN
INTRAMUSCULAR | Status: DC | PRN
  Administered 2017-11-13: 100 ug via INTRAVENOUS
  Administered 2017-11-13: 50 ug via INTRAVENOUS
  Administered 2017-11-13 (×3): 100 ug via INTRAVENOUS
  Administered 2017-11-13: 50 ug via INTRAVENOUS

## 2017-11-13 MED ORDER — SODIUM CHLORIDE 0.9% FLUSH: 3.0000 mL | INTRAVENOUS | Status: DC | PRN

## 2017-11-13 MED ORDER — LIDOCAINE 2% (20 MG/ML) 5 ML SYRINGE
INTRAMUSCULAR | Status: AC
  Filled 2017-11-13: qty 5

## 2017-11-13 MED ORDER — SUGAMMADEX SODIUM 200 MG/2ML IV SOLN
INTRAVENOUS | Status: AC
  Filled 2017-11-13: qty 2

## 2017-11-13 MED ORDER — NEOSTIGMINE METHYLSULFATE 5 MG/5ML IV SOSY
PREFILLED_SYRINGE | INTRAVENOUS | Status: DC | PRN
  Administered 2017-11-13: 3 mg via INTRAVENOUS

## 2017-11-13 MED ORDER — LIDOCAINE 2% (20 MG/ML) 5 ML SYRINGE
INTRAMUSCULAR | Status: DC | PRN
  Administered 2017-11-13: 40 mg via INTRAVENOUS

## 2017-11-13 MED ORDER — POLYETHYLENE GLYCOL 3350 17 GM/SCOOP PO POWD: 1.0000 | Freq: Once | ORAL | Status: DC

## 2017-11-13 MED ORDER — MEPERIDINE HCL 25 MG/ML IJ SOLN: 6.2500 mg | INTRAMUSCULAR | Status: DC | PRN

## 2017-11-13 MED ORDER — FENTANYL CITRATE (PF) 100 MCG/2ML IJ SOLN
25.0000 ug | INTRAMUSCULAR | Status: DC | PRN
  Administered 2017-11-13: 50 ug via INTRAVENOUS

## 2017-11-13 MED ORDER — 0.9 % SODIUM CHLORIDE (POUR BTL) OPTIME
TOPICAL | Status: DC | PRN
  Administered 2017-11-13: 1000 mL

## 2017-11-13 MED ORDER — CYCLOBENZAPRINE HCL 10 MG PO TABS
10.0000 mg | ORAL_TABLET | Freq: Three times a day (TID) | ORAL | Status: DC | PRN
  Administered 2017-11-15 – 2017-11-18 (×4): 10 mg via ORAL
  Filled 2017-11-13 (×4): qty 1

## 2017-11-13 MED ORDER — CEFAZOLIN SODIUM-DEXTROSE 1-4 GM/50ML-% IV SOLN
1.0000 g | Freq: Three times a day (TID) | INTRAVENOUS | Status: AC
  Administered 2017-11-13 – 2017-11-14 (×2): 1 g via INTRAVENOUS
  Filled 2017-11-13 (×3): qty 50

## 2017-11-13 MED ORDER — ONDANSETRON HCL 4 MG/2ML IJ SOLN
INTRAMUSCULAR | Status: DC | PRN
  Administered 2017-11-13: 4 mg via INTRAVENOUS

## 2017-11-13 MED ORDER — HYDROCODONE-ACETAMINOPHEN 5-325 MG PO TABS
1.0000 | ORAL_TABLET | ORAL | Status: DC | PRN
  Administered 2017-11-17: 1 via ORAL
  Filled 2017-11-13: qty 1

## 2017-11-13 MED ORDER — ALPRAZOLAM 0.5 MG PO TABS
0.5000 mg | ORAL_TABLET | Freq: Three times a day (TID) | ORAL | Status: DC | PRN
  Administered 2017-11-17: 0.5 mg via ORAL
  Filled 2017-11-13: qty 1

## 2017-11-13 MED ORDER — ZOLPIDEM TARTRATE 5 MG PO TABS: 5.0000 mg | ORAL_TABLET | Freq: Every evening | ORAL | Status: DC | PRN

## 2017-11-13 MED ORDER — MIDAZOLAM HCL 5 MG/5ML IJ SOLN
INTRAMUSCULAR | Status: DC | PRN
  Administered 2017-11-13: 2 mg via INTRAVENOUS

## 2017-11-13 MED ORDER — HYDROCODONE-ACETAMINOPHEN 7.5-325 MG PO TABS
1.0000 | ORAL_TABLET | Freq: Four times a day (QID) | ORAL | Status: DC
  Administered 2017-11-13 – 2017-11-19 (×6): 1 via ORAL
  Filled 2017-11-13 (×10): qty 1

## 2017-11-13 MED ORDER — LACTATED RINGERS IV SOLN
INTRAVENOUS | Status: DC
  Administered 2017-11-13 (×3): via INTRAVENOUS

## 2017-11-13 MED ORDER — MENTHOL 3 MG MT LOZG
1.0000 | LOZENGE | OROMUCOSAL | Status: DC | PRN
  Administered 2017-11-13: 3 mg via ORAL
  Filled 2017-11-13: qty 9

## 2017-11-13 MED ORDER — DEXAMETHASONE SODIUM PHOSPHATE 10 MG/ML IJ SOLN
INTRAMUSCULAR | Status: AC
  Filled 2017-11-13: qty 1

## 2017-11-13 MED ORDER — THROMBIN (RECOMBINANT) 5000 UNITS EX SOLR
CUTANEOUS | Status: DC | PRN
  Administered 2017-11-13: 5000 [IU] via TOPICAL

## 2017-11-13 MED ORDER — AMLODIPINE BESYLATE 5 MG PO TABS
5.0000 mg | ORAL_TABLET | Freq: Every day | ORAL | Status: DC
  Administered 2017-11-14 – 2017-11-19 (×6): 5 mg via ORAL
  Filled 2017-11-13 (×6): qty 1

## 2017-11-13 MED ORDER — PROPOFOL 10 MG/ML IV BOLUS
INTRAVENOUS | Status: AC
  Filled 2017-11-13: qty 20

## 2017-11-13 MED ORDER — PNEUMOCOCCAL VAC POLYVALENT 25 MCG/0.5ML IJ INJ
0.5000 mL | INJECTION | INTRAMUSCULAR | Status: AC
  Administered 2017-11-14: 0.5 mL via INTRAMUSCULAR

## 2017-11-13 SURGICAL SUPPLY — 67 items
ADH SKN CLS APL DERMABOND .7 (GAUZE/BANDAGES/DRESSINGS) ×1
APL SKNCLS STERI-STRIP NONHPOA (GAUZE/BANDAGES/DRESSINGS)
BENZOIN TINCTURE PRP APPL 2/3 (GAUZE/BANDAGES/DRESSINGS) IMPLANT
BUR MATCHSTICK NEURO 3.0 LAGG (BURR) ×3 IMPLANT
BUR PRECISION FLUTE 5.0 (BURR) ×3 IMPLANT
CANISTER SUCT 3000ML PPV (MISCELLANEOUS) ×3 IMPLANT
CARTRIDGE OIL MAESTRO DRILL (MISCELLANEOUS) ×1 IMPLANT
CLOSURE WOUND 1/2 X4 (GAUZE/BANDAGES/DRESSINGS)
CONT SPEC 4OZ CLIKSEAL STRL BL (MISCELLANEOUS) ×5 IMPLANT
COVER BACK TABLE 60X90IN (DRAPES) ×3 IMPLANT
DECANTER SPIKE VIAL GLASS SM (MISCELLANEOUS) ×3 IMPLANT
DERMABOND ADVANCED (GAUZE/BANDAGES/DRESSINGS) ×2
DERMABOND ADVANCED .7 DNX12 (GAUZE/BANDAGES/DRESSINGS) ×1 IMPLANT
DIFFUSER DRILL AIR PNEUMATIC (MISCELLANEOUS) ×3 IMPLANT
DRAPE C-ARM 42X72 X-RAY (DRAPES) ×8 IMPLANT
DRAPE C-ARMOR (DRAPES) ×2 IMPLANT
DRAPE LAPAROTOMY 100X72X124 (DRAPES) ×3 IMPLANT
DRAPE POUCH INSTRU U-SHP 10X18 (DRAPES) ×3 IMPLANT
DRAPE SURG 17X23 STRL (DRAPES) ×3 IMPLANT
DRSG OPSITE POSTOP 4X6 (GAUZE/BANDAGES/DRESSINGS) ×2 IMPLANT
DURAPREP 26ML APPLICATOR (WOUND CARE) ×3 IMPLANT
ELECT REM PT RETURN 9FT ADLT (ELECTROSURGICAL) ×3
ELECTRODE REM PT RTRN 9FT ADLT (ELECTROSURGICAL) ×1 IMPLANT
GAUZE SPONGE 4X4 12PLY STRL (GAUZE/BANDAGES/DRESSINGS) IMPLANT
GAUZE SPONGE 4X4 16PLY XRAY LF (GAUZE/BANDAGES/DRESSINGS) IMPLANT
GLOVE ECLIPSE 6.5 STRL STRAW (GLOVE) ×6 IMPLANT
GLOVE EXAM NITRILE LRG STRL (GLOVE) IMPLANT
GLOVE EXAM NITRILE XL STR (GLOVE) IMPLANT
GLOVE EXAM NITRILE XS STR PU (GLOVE) IMPLANT
GOWN STRL REUS W/ TWL LRG LVL3 (GOWN DISPOSABLE) ×2 IMPLANT
GOWN STRL REUS W/ TWL XL LVL3 (GOWN DISPOSABLE) IMPLANT
GOWN STRL REUS W/TWL 2XL LVL3 (GOWN DISPOSABLE) IMPLANT
GOWN STRL REUS W/TWL LRG LVL3 (GOWN DISPOSABLE) ×9
GOWN STRL REUS W/TWL XL LVL3 (GOWN DISPOSABLE)
GRAFT BN 5X1XSPNE CVD POST DBM (Bone Implant) IMPLANT
GRAFT BONE MAGNIFUSE 1X5CM (Bone Implant) ×3 IMPLANT
KIT BASIN OR (CUSTOM PROCEDURE TRAY) ×3 IMPLANT
KIT POSITION SURG JACKSON T1 (MISCELLANEOUS) ×3 IMPLANT
KIT ROOM TURNOVER OR (KITS) ×3 IMPLANT
NDL HYPO 25X1 1.5 SAFETY (NEEDLE) ×1 IMPLANT
NDL SPNL 18GX3.5 QUINCKE PK (NEEDLE) IMPLANT
NEEDLE HYPO 25X1 1.5 SAFETY (NEEDLE) ×3 IMPLANT
NEEDLE SPNL 18GX3.5 QUINCKE PK (NEEDLE) ×3 IMPLANT
NS IRRIG 1000ML POUR BTL (IV SOLUTION) ×3 IMPLANT
OIL CARTRIDGE MAESTRO DRILL (MISCELLANEOUS) ×3
PACK LAMINECTOMY NEURO (CUSTOM PROCEDURE TRAY) ×3 IMPLANT
PAD ARMBOARD 7.5X6 YLW CONV (MISCELLANEOUS) ×6 IMPLANT
PLATE AFFIX 45MM (Plate) ×2 IMPLANT
ROD RELINE O COCR 5.0X50MM (Rod) ×2 IMPLANT
ROD RELINE O-H CON M 5.0/6.0MM (Rod) ×4 IMPLANT
ROD RELINE-O COCR 5.0X55MM (Rod) ×2 IMPLANT
SCREW LOCK RELINE 5.5 TULIP (Screw) ×4 IMPLANT
SCREW LOCK RSS 4.5/5.0MM (Screw) ×4 IMPLANT
SCREW RELINE RMM 6.5X40 4S (Screw) ×2 IMPLANT
SCREW RMM POLYAXIAL 6.5X50 (Screw) ×4 IMPLANT
SPONGE LAP 4X18 X RAY DECT (DISPOSABLE) IMPLANT
SPONGE SURGIFOAM ABS GEL 100 (HEMOSTASIS) ×3 IMPLANT
STRIP CLOSURE SKIN 1/2X4 (GAUZE/BANDAGES/DRESSINGS) IMPLANT
SUT PROLENE 6 0 BV (SUTURE) IMPLANT
SUT VIC AB 0 CT1 18XCR BRD8 (SUTURE) ×1 IMPLANT
SUT VIC AB 0 CT1 8-18 (SUTURE) ×6
SUT VIC AB 2-0 CT1 18 (SUTURE) ×3 IMPLANT
SUT VIC AB 3-0 SH 8-18 (SUTURE) ×3 IMPLANT
TOWEL GREEN STERILE (TOWEL DISPOSABLE) ×3 IMPLANT
TOWEL GREEN STERILE FF (TOWEL DISPOSABLE) ×3 IMPLANT
TRAY FOLEY W/METER SILVER 16FR (SET/KITS/TRAYS/PACK) ×3 IMPLANT
WATER STERILE IRR 1000ML POUR (IV SOLUTION) ×3 IMPLANT
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
## 2017-11-13 NOTE — Anesthesia Postprocedure Evaluation (Signed)
Anesthesia Post Note  Patient: [name]  Procedure(s) Performed: POSTERIOR LUMBAR FUSION LUMBAR FOUR - LUMBAR FIVE (N/A Spine Lumbar)     Anesthesia Post Evaluation  Last Vitals:  Vitals:   11/13/17 0806  BP: (!) 142/67  Pulse: 89  Resp: 20  Temp: 36.4 C  SpO2: 98%    Last Pain:  Vitals:   11/13/17 0806  TempSrc: Kirk Ruths

## 2017-11-13 NOTE — Addendum Note (Signed)
Addendum  created 11/13/17 1646 by Verdie Drown, CRNA   Charge Capture section accepted

## 2017-11-13 NOTE — Progress Notes (Signed)
Orthopedic Tech Progress Note Patient Details:  Victoria Molina 12-08-1945 [id number] Called [employer] for brace. Patient ID: [name], female   DOB: 1946-10-18, 72 y.o.   MRN: [id number]   [place]ita, [place] 11/13/2017, 4:26 PM

## 2017-11-13 NOTE — Transfer of Care (Signed)
Immediate Anesthesia Transfer of Care Note  Patient: Victoria Molina  Procedure(s) Performed: POSTERIOR LUMBAR FUSION LUMBAR FOUR - LUMBAR FIVE (N/A Spine Lumbar)  Patient Location: PACU  Anesthesia Type:General  Level of Consciousness: patient cooperative and responds to stimulation  Airway & Oxygen Therapy: Patient Spontanous Breathing and Patient connected to nasal cannula oxygen  Post-op Assessment: Report given to RN, Post -op Vital signs reviewed and stable and Patient moving all extremities X 4  Post vital signs: Reviewed and stable  Last Vitals:  Vitals:   11/13/17 0806  BP: (!) 142/67  Pulse: 89  Resp: 20  Temp: 36.4 C  SpO2: 98%    Last Pain:  Vitals:   11/13/17 0806  TempSrc: Oral         Complications: No apparent anesthesia complications

## 2017-11-13 NOTE — Progress Notes (Signed)
PHARMACIST - PHYSICIAN ORDER COMMUNICATION  CONCERNING: P&T Medication Policy on Herbal Medications  DESCRIPTION:  This patient's order for:  Turmeric Curcumin  has been noted.  This product(s) is classified as an "herbal" or natural product. Due to a lack of definitive safety studies or FDA approval, nonstandard manufacturing practices, plus the potential risk of unknown drug-drug interactions while on inpatient medications, the Pharmacy and Therapeutics Committee does not permit the use of "herbal" or natural products of this type within [facility].   ACTION TAKEN: The pharmacy department is unable to verify this order at this time and your patient has been informed of this safety policy. Please reevaluate patient's clinical condition at discharge and address if the herbal or natural product(s) should be resumed at that time.   Nena Jordan, PharmD, BCPS 11/13/2017, 4:38 PM

## 2017-11-13 NOTE — Anesthesia Preprocedure Evaluation (Signed)
Anesthesia Evaluation  Patient identified by MRN, date of birth, ID band Patient awake    Reviewed: Allergy & Precautions, NPO status , Patient's Chart, lab work & pertinent test results  History of Anesthesia Complications (+) PONV, Family history of anesthesia reaction and history of anesthetic complications  Airway Mallampati: II  TM Distance: >3 FB Neck ROM: Full    Dental  (+) Teeth Intact, Dental Advisory Given, Caps,    Pulmonary former smoker,    Pulmonary exam normal breath sounds clear to auscultation       Cardiovascular hypertension, Pt. on medications (-) angina(-) CAD and (-) Past MI Normal cardiovascular exam Rhythm:Regular Rate:Normal     Neuro/Psych PSYCHIATRIC DISORDERS Anxiety negative neurological ROS     GI/Hepatic Neg liver ROS, hiatal hernia, GERD  Medicated,  Endo/Other  Obesity   Renal/GU negative Renal ROS     Musculoskeletal  (+) Arthritis ,   Abdominal   Peds  Hematology negative hematology ROS (+)   Anesthesia Other Findings Day of surgery medications reviewed with the patient.  Reproductive/Obstetrics                             Anesthesia Physical  Anesthesia Plan  ASA: II  Anesthesia Plan: General   Post-op Pain Management:    Induction: Intravenous  PONV Risk Score and Plan: 4 or greater and Ondansetron, Dexamethasone, Midazolam and Treatment may vary due to age or medical condition  Airway Management Planned: Oral ETT  Additional Equipment:   Intra-op Plan:   Post-operative Plan: Extubation in OR  Informed Consent: I have reviewed the patients History and Physical, chart, labs and discussed the procedure including the risks, benefits and alternatives for the proposed anesthesia with the patient or authorized representative who has indicated his/her understanding and acceptance.   Dental advisory given  Plan Discussed with:  CRNA  Anesthesia Plan Comments: (Risks/benefits of general anesthesia discussed with patient including risk of damage to teeth, lips, gum, and tongue, nausea/vomiting, allergic reactions to medications, and the possibility of heart attack, stroke and death.  All patient questions answered.  Patient wishes to proceed.)        Anesthesia Quick Evaluation

## 2017-11-13 NOTE — Progress Notes (Signed)
New Admission Note:  Arrival Method:on stretcher  Mental Orientation: Alert & oriented x 4 Assessment: Completed Skin: surgical incision on lower back  IV: L hand  Pain:7/10 ; pt given pain medicine  Safety Measures: Safety Fall Prevention Plan was given, discussed. Admission: Completed 3W13: Patient has been orientated to the room, unit and the staff. Family:updated by PACU nurse  Ortho Tech called to order brace.   Orders have been reviewed and implemented. Will continue to monitor the patient. Call light has been placed within reach and bed alarm has been activated.   Arta Silence ,RN

## 2017-11-14 ENCOUNTER — Encounter: Payer: Self-pay | Admitting: Gastroenterology

## 2017-11-14 MED FILL — Thrombin For Soln 20000 Unit: CUTANEOUS | Qty: 1 | Status: AC

## 2017-11-14 MED FILL — Gelatin Absorbable MT Powder: OROMUCOSAL | Qty: 1 | Status: AC

## 2017-11-14 NOTE — Care Management Note (Signed)
Case Management Note  Patient Details  Name: Victoria Molina MRN: [id number] Date of Birth: September 28, 1946  Subjective/Objective:    Pt underwent:   POSTERIOR LUMBAR FUSION LUMBAR FOUR - LUMBAR FIVE. He is from home with spouse.               Action/Plan: PT recommending outpatient therapy. Awaiting OT. CM following for d/c needs, physician orders.   Expected Discharge Date:                  Expected Discharge Plan:  OP Rehab  In-House Referral:     Discharge planning Services  CM Consult  Post Acute Care Choice:    Choice offered to:     DME Arranged:    DME Agency:     HH Arranged:    HH Agency:     Status of Service:  In process, will continue to follow  If discussed at Long Length of Stay Meetings, dates discussed:    Additional Comments:  Pollie Friar, RN 11/14/2017, 3:23 PM

## 2017-11-14 NOTE — Evaluation (Signed)
Occupational Therapy Evaluation Patient Details Name: Victoria Molina MRN: [id number] DOB: January 08, 1946 Today's Date: 11/14/2017    History of Present Illness 72yo female s/p posterior lumbar fusion L4-L5 done 11/13/17. PMH anxiety, OA, LBP, claustrophobia, h/o hernia, hx L shoulder tendon injury, hx back surgery, lumbar disc surgery, hx prior lumbar fusion    Clinical Impression   This 72 yo female admitted with above presents to acute OT with back precautions and decreased  Mobility both affecting safety and independence with basic ADLs. She will benefit from acute OT without need for follow up.     Follow Up Recommendations  No OT follow up    Equipment Recommendations  None recommended by OT       Precautions / Restrictions Precautions Precautions: Fall;Back Precaution Booklet Issued: Yes (comment) Precaution Comments: needs lumbar brace  Required Braces or Orthoses: Spinal Brace Spinal Brace: Applied in sitting position Spinal Brace Comments: Can have off for quick trips to bathroom and showering and in bed Restrictions Weight Bearing Restrictions: No              ADL either performed or assessed with clinical judgement   ADL                                         General ADL Comments: Educated pt and family on use of wet wipes for back peri care to help with not twisting, use of 2 cups for brushing teeth to avoid bending over sink (one spit and one rinse). Pt had just gotten back to bed and politely did not want to get up again at time of eval.     Vision Patient Visual Report: No change from baseline              Pertinent Vitals/Pain Pain Assessment: No/denies pain      Hand Dominance Right   Extremity/Trunk Assessment Upper Extremity Assessment Upper Extremity Assessment: Overall WFL for tasks assessed     Communication Communication Communication: No difficulties   Cognition Arousal/Alertness: Awake/alert Behavior During  Therapy: WFL for tasks assessed/performed Overall Cognitive Status: Within Functional Limits for tasks assessed                 Home Living Family/patient expects to be discharged to:: Private residence Living Arrangements: Spouse/significant other Available Help at Discharge: Family;Available PRN/intermittently Type of Home: House Home Access: Stairs to enter [employer] of Steps: 3 Entrance Stairs-Rails: None Home Layout: One level;Laundry or work area in basement     [employer] Shower/Tub: Occupational psychologist: Handicapped height Bathroom Accessibility: Yes   Home Equipment: Environmental consultant - 2 wheels;Cane - single point;Grab bars - tub/shower;Shower seat - built in;Hand held shower head          Prior Functioning/Environment Level of Independence: Independent                 OT Problem List: Decreased strength;Decreased range of motion;Impaired balance (sitting and/or standing);Decreased knowledge of use of DME or AE;Decreased knowledge of precautions      OT Treatment/Interventions: Self-care/ADL training;Balance training;DME and/or AE instruction;Patient/family education    OT Goals(Current goals can be found in the care plan section) Acute Rehab OT Goals Patient Stated Goal: to go home  OT Goal Formulation: With patient Time For Goal Achievement: 11/21/17 Potential to Achieve Goals: Good  OT Frequency: Min 2X/week  AM-PAC PT "6 Clicks" Daily Activity     Outcome Measure Help from another person eating meals?: None Help from another person taking care of personal grooming?: A Little Help from another person toileting, which includes using toliet, bedpan, or urinal?: A Little Help from another person bathing (including washing, rinsing, drying)?: A Lot Help from another person to put on and taking off regular upper body clothing?: A Little Help from another person to put on and taking off regular lower body clothing?: A  Lot 6 Click Score: 17   End of Session    Activity Tolerance: Patient tolerated treatment well Patient left: in bed;with family/visitor present  OT Visit Diagnosis: Other abnormalities of gait and mobility (R26.89)                Time: 1546-1600 OT Time Calculation (min): 14 min Charges:  OT General Charges $OT Visit: 1 Visit OT Evaluation $OT Eval Moderate Complexity: [address] [place] [phone] 11/14/2017

## 2017-11-14 NOTE — Evaluation (Signed)
Physical Therapy Evaluation Patient Details Name: Victoria Molina MRN: [id number] DOB: 1945/12/29 Today's Date: 11/14/2017   History of Present Illness  72yo female s/p posterior lumbar fusion L4-L5 done 11/13/17. PMH anxiety, OA, LBP, claustrophobia, h/o hernia, hx L shoulder tendon injury, hx back surgery, lumbar disc surgery, hx prior lumbar fusion   Clinical Impression   Patient received in bed, pleasant and willing to work with skilled PT services today. She is able to complete all functional mobility with levels of assistance as detailed below, however does require VC to maintain back precautions this morning. Note mild generalized unsteadiness and LE weakness with ambulation, patient reliant on UE support on rolling walker today but is able to move steadily at self-selected pace. She will likely benefit from skilled OP PT services moving forward. Patient was left up in the chair with all needs met this morning.     Follow Up Recommendations Outpatient PT    Equipment Recommendations  None recommended by PT(patient appears to have necessary DME at home )    Recommendations for Other Services       Precautions / Restrictions Precautions Precautions: Fall;Back Precaution Comments: needs lumbar brace  Required Braces or Orthoses: Spinal Brace Spinal Brace: Applied in sitting position;Other (comment) Spinal Brace Comments: Aspen lumbar brace  Restrictions Weight Bearing Restrictions: No      Mobility  Bed Mobility Overal bed mobility: Needs Assistance Bed Mobility: Supine to Sit     Supine to sit: Supervision     General bed mobility comments: S for safety, VC for rolling/mechanics keeping lumbar spine in neutral   Transfers Overall transfer level: Needs assistance Equipment used: Rolling walker (2 wheeled) Transfers: Sit to/from Stand Sit to Stand: Min guard            Ambulation/Gait Ambulation/Gait assistance: Min guard Ambulation Distance (Feet): 100  Feet Assistive device: Rolling walker (2 wheeled) Gait Pattern/deviations: Step-through pattern;Decreased step length - right;Decreased step length - left;Trunk flexed     General Gait Details: patient slow but steady with RW   Stairs            Wheelchair Mobility    Modified Rankin (Stroke Patients Only)       Balance Overall balance assessment: Needs assistance Sitting-balance support: Bilateral upper extremity supported;Feet supported Sitting balance-Leahy Scale: Good     Standing balance support: Bilateral upper extremity supported;During functional activity Standing balance-Leahy Scale: Fair Standing balance comment: reliant on UE support                              Pertinent Vitals/Pain Pain Assessment: 0-10 Pain Score: 3  Pain Location: low back  Pain Descriptors / Indicators: Aching;Sore Pain Intervention(s): Limited activity within patient's tolerance;Monitored during session;Repositioned    Home Living Family/patient expects to be discharged to:: Private residence Living Arrangements: Spouse/significant other Available Help at Discharge: Family;Available PRN/intermittently Type of Home: House Home Access: Stairs to enter Entrance Stairs-Rails: None Entrance Stairs-Number of Steps: 3 Home Layout: One level;Laundry or work area in [place]: Environmental consultant - 2 wheels;Cane - single point;Grab bars - tub/shower;Shower seat - built in      Prior Function Level of Independence: Independent               Hand Dominance   Dominant Hand: Right    Extremity/Trunk Assessment   Upper Extremity Assessment Upper Extremity Assessment: Defer to OT evaluation    Lower Extremity Assessment Lower Extremity  Assessment: Generalized weakness    Cervical / Trunk Assessment Cervical / Trunk Assessment: Normal  Communication   Communication: No difficulties  Cognition Arousal/Alertness: Awake/alert Behavior During Therapy: WFL for  tasks assessed/performed Overall Cognitive Status: Within Functional Limits for tasks assessed                                        General Comments      Exercises     Assessment/Plan    PT Assessment Patient needs continued PT services  PT Problem List Decreased strength;Decreased mobility;Decreased safety awareness;Decreased coordination;Decreased balance       PT Treatment Interventions DME instruction;Therapeutic activities;Gait training;Therapeutic exercise;Patient/family education;Stair training;Balance training;Functional mobility training;Neuromuscular re-education    PT Goals (Current goals can be found in the Care Plan section)  Acute Rehab PT Goals Patient Stated Goal: to go home  PT Goal Formulation: With patient Time For Goal Achievement: 11/21/17 Potential to Achieve Goals: Good    Frequency Min 5X/week   Barriers to discharge        Co-evaluation               AM-PAC PT "6 Clicks" Daily Activity  Outcome Measure Difficulty turning over in bed (including adjusting bedclothes, sheets and blankets)?: None Difficulty moving from lying on back to sitting on the side of the bed? : None Difficulty sitting down on and standing up from a chair with arms (e.g., wheelchair, bedside commode, etc,.)?: None Help needed moving to and from a bed to chair (including a wheelchair)?: A Little Help needed walking in hospital room?: A Little Help needed climbing 3-5 steps with a railing? : A Lot 6 Click Score: 20    End of Session Equipment Utilized During Treatment: Gait belt;Back brace Activity Tolerance: Patient tolerated treatment well Patient left: in chair;with call bell/phone within reach   PT Visit Diagnosis: Unsteadiness on feet (R26.81);Muscle weakness (generalized) (M62.81);Difficulty in walking, not elsewhere classified (R26.2)    Time: 4098-1191 PT Time Calculation (min) (ACUTE ONLY): 24 min   Charges:   PT Evaluation $PT Eval  Low Complexity: 1 Low PT Treatments $Gait Training: 8-22 mins   PT G Codes:        Deniece Ree PT, DPT, CBIS  Supplemental Physical Therapist [place]   Pager [phone]

## 2017-11-15 NOTE — Op Note (Signed)
11/13/2017  6:28 PM  PATIENT:  Victoria Molina  72 y.o. 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 female whom was taken to the operating room intubated, and placed under a general anesthetic without difficulty. A foley catheter was placed under sterile conditions. She was positioned prone on a Jackson stable with all pressure points properly padded.  Her lumbar region was prepped and draped in a sterile manner. I infiltrated 10cc's 1/2%lidocaine/1:2000,000 strength epinephrine into the planned incision. I opened the skin with a 10 blade and took the incision down to the thoracolumbar fascia. I exposed the lamina of L4, and L5 in a subperiosteal fashion bilaterally. I confirmed my location with an intraoperative xray.  I placed self retaining retractors and started the decompression.  I decompressed the spinal canal via a complete laminectomy of L4 and hemilaminectomy of L5. I removed the synovial cyst, and inferior facets of L5 bilaterally. This allowed for a decompression of the L4 roots. I used the drill and Kerrison punches. I decorticated the lateral bone at L4, and L5. I then placed allograft morsels on the decorticated surfaces to complete the posterolateral arthrodesis.  I placed pedicle screws at L5, using fluoroscopic guidance. I drilled a pilot hole,  then cannulated the pedicle with a drill at each site. I then tapped each pedicle, assessing each site for pedicle violations. No cutouts were appreciated. Screws (Nuvasive mas plif) were then placed at each site without difficulty. We then used connectors to attach the screw to the existing rod from her previous surgery from L3-5. I used a side connector and secured the L5 screw to the rod.  The locking caps were secured with torque limited screwdrivers. Final films were performed and the final construct appeared to be in good position.  We closed the wound in a layered fashion. We approximated the thoracolumbar fascia, subcutaneous, and subcuticular planes with vicryl sutures. I used dermabond and an occlusive bandage for a sterile dressing.     PLAN OF CARE: Admit to inpatient   PATIENT DISPOSITION:  PACU - hemodynamically stable.   Delay start of Pharmacological VTE agent (>24hrs) due to surgical blood loss or risk of bleeding:  yes

## 2017-11-15 NOTE — Progress Notes (Signed)
Physical Therapy Treatment Patient Details Name: Victoria Molina MRN: [id number] DOB: Jul 26, 1946 Today's Date: 11/15/2017    History of Present Illness 72yo female s/p posterior lumbar fusion L4-L5 done 11/13/17. PMH anxiety, OA, LBP, claustrophobia, h/o hernia, hx L shoulder tendon injury, hx back surgery, lumbar disc surgery, hx prior lumbar fusion     PT Comments    Patient received in bed, pleasant and willing to work with skilled PT services today. Her overall mobility has improved, and she is able to complete functional bed mobility, functional transfers, and gait with improved ease of motion however does continue to require verbal cues for safety with transfers. Able to ambulate approximately 164f today, and introduced stair training first without railing and noting significant unsteadiness with stair descent requiring min guard, then with L railing with much improved steadiness/safety. Patient reports she does have a handhold to use for her stairs at home, even though it does not have a railing. She was left up in the chair with all needs met this morning.     Follow Up Recommendations  Outpatient PT     Equipment Recommendations  None recommended by PT    Recommendations for Other Services       Precautions / Restrictions Precautions Precautions: Fall;Back Precaution Booklet Issued: Yes (comment) Precaution Comments: needs lumbar brace  Required Braces or Orthoses: Spinal Brace Spinal Brace: Applied in sitting position Spinal Brace Comments: Can have off for quick trips to bathroom and showering and in bed Restrictions Weight Bearing Restrictions: No    Mobility  Bed Mobility Overal bed mobility: Needs Assistance Bed Mobility: Rolling;Supine to Sit Rolling: Modified independent (Device/Increase time)   Supine to sit: Modified independent (Device/Increase time)     General bed mobility comments: increased time and effort noted   Transfers Overall transfer level:  Needs assistance Equipment used: Rolling walker (2 wheeled) Transfers: Sit to/from Stand Sit to Stand: Supervision         General transfer comment: VC for safety and sequencing   Ambulation/Gait Ambulation/Gait assistance: Supervision Ambulation Distance (Feet): 120 Feet Assistive device: Rolling walker (2 wheeled) Gait Pattern/deviations: Step-through pattern;Decreased step length - right;Decreased step length - left;Trunk flexed     General Gait Details: patient slow but steady with RW    Stairs Stairs: Yes   Stair Management: No rails;One rail Left Number of Stairs: 3(2 sets of 3 steps- one with L rail, one with no rail ) General stair comments: patient unsteady on stair descent with no railing, with mild posterior unsteadiness noted; appears steady and safe with use of L railing, she reports she does have a handhold to use when navigating steps even though there is no rCamera operatorRankin (Stroke Patients Only)       Balance                                            Cognition Arousal/Alertness: Awake/alert Behavior During Therapy: WFL for tasks assessed/performed Overall Cognitive Status: Within Functional Limits for tasks assessed                                        Exercises      General Comments        Pertinent Vitals/Pain Pain Assessment:  0-10 Pain Score: 6  Pain Location: low back  Pain Descriptors / Indicators: Aching;Sore Pain Intervention(s): Limited activity within patient's tolerance;Repositioned;Monitored during session    Home Living                      Prior Function            PT Goals (current goals can now be found in the care plan section) Acute Rehab PT Goals Patient Stated Goal: to go home  PT Goal Formulation: With patient Time For Goal Achievement: 11/21/17 Potential to Achieve Goals: Good Progress towards PT goals: Progressing toward goals     Frequency    Min 5X/week      PT Plan Current plan remains appropriate    Co-evaluation              AM-PAC PT "6 Clicks" Daily Activity  Outcome Measure  Difficulty turning over in bed (including adjusting bedclothes, sheets and blankets)?: None Difficulty moving from lying on back to sitting on the side of the bed? : None Difficulty sitting down on and standing up from a chair with arms (e.g., wheelchair, bedside commode, etc,.)?: None Help needed moving to and from a bed to chair (including a wheelchair)?: None Help needed walking in hospital room?: A Little Help needed climbing 3-5 steps with a railing? : A Little 6 Click Score: 22    End of Session Equipment Utilized During Treatment: Gait belt;Back brace Activity Tolerance: Patient tolerated treatment well Patient left: in chair;with call bell/phone within reach   PT Visit Diagnosis: Unsteadiness on feet (R26.81);Muscle weakness (generalized) (M62.81);Difficulty in walking, not elsewhere classified (R26.2)     Time: 7858-8502 PT Time Calculation (min) (ACUTE ONLY): 15 min  Charges:  $Gait Training: 8-22 mins                    G Codes:       Deniece Ree PT, DPT, CBIS  Supplemental Physical Therapist [place]   Pager [phone]

## 2017-11-15 NOTE — Progress Notes (Signed)
Occupational Therapy Treatment Patient Details Name: GIANNAH ZAVADIL MRN: [id number] DOB: [birth year] Today's Date: 11/15/2017    History of present illness 72yo female s/p posterior lumbar fusion L4-L5 done 11/13/17. PMH anxiety, OA, LBP, claustrophobia, h/o hernia, hx L shoulder tendon injury, hx back surgery, lumbar disc surgery, hx prior lumbar fusion    OT comments  Pt progressing towards goals, with recall of 2/3 back precautions without cues this session. Pt completed room and hallway level functional mobility, standing grooming ADLs at RW level with MinGuard assist and Min verbal cues for adhering to back precautions during functional task completion. Reviewed completion of ADLs while adhering to back precautions including use of AE with Pt verbalizing understanding. Will continue to follow acutely to progress Pt's safety and independence with ADLs and mobility prior to return home.    Follow Up Recommendations  No OT follow up    Equipment Recommendations  None recommended by OT          Precautions / Restrictions Precautions Precautions: Fall;Back Precaution Comments: needs lumbar brace  Required Braces or Orthoses: Spinal Brace Spinal Brace: Applied in sitting position Spinal Brace Comments: Can have off for quick trips to bathroom and showering and in bed Restrictions Weight Bearing Restrictions: No       Mobility Bed Mobility Overal bed mobility: Needs Assistance Bed Mobility: Supine to Sit;Sidelying to Sit Rolling: Min assist Sidelying to sit: Min guard       General bed mobility comments: increased time and effort, minA when rolling to maintain body alignment; minguard for safety when transitioning to sitting EOB   Transfers Overall transfer level: Needs assistance Equipment used: Rolling walker (2 wheeled) Transfers: Sit to/from Stand Sit to Stand: Supervision         General transfer comment: Supervision for safety, stood from EOB x2 and chair with  armrests     Balance Overall balance assessment: Needs assistance Sitting-balance support: Bilateral upper extremity supported;Feet supported Sitting balance-Leahy Scale: Good     Standing balance support: Bilateral upper extremity supported;During functional activity;No upper extremity supported Standing balance-Leahy Scale: Fair Standing balance comment: Pt maintaining static standing without UE support with close minguard, utilizing UE support on RW during mobility                            ADL either performed or assessed with clinical judgement   ADL Overall ADL's : Needs assistance/impaired     Grooming: Oral care;Wash/dry face;Min guard;Standing Grooming Details (indicate cue type and reason): Pt return demonstrating compensatory techinques for completing oral care while adhering to back precautions (min verbal cues) Upper Body Bathing: Min guard;Standing Upper Body Bathing Details (indicate cue type and reason): washing UEs while standing at sink            Lower Body Dressing Details (indicate cue type and reason): reviewed LB dressing techinques; Pt reporting she has reacher and sock aide at home for LB dressing tasks; verbalizing technique for using AE to complete tasks        Toileting - Clothing Manipulation Details (indicate cue type and reason): reviewed use of toilet aide for increased independence during peri-care while adhering to precautions      Functional mobility during ADLs: Min guard;Supervision/safety;Rolling walker General ADL Comments: reviewed and further education provided on back precautions/compensatory techniques for completing ADLs; Pt recalling 2/3 back precautions without cues; Pt completed room and hallway level functional mobility with RW at MinGuard level  Cognition Arousal/Alertness: Awake/alert Behavior During Therapy: WFL for tasks assessed/performed Overall Cognitive Status: Within Functional  Limits for tasks assessed                                                            Pertinent Vitals/ Pain       Pain Assessment: Faces Faces Pain Scale: Hurts little more Pain Location: low back  Pain Descriptors / Indicators: Aching;Sore Pain Intervention(s): Limited activity within patient's tolerance;Monitored during session;Repositioned                                                          Frequency  Min 2X/week        Progress Toward Goals  OT Goals(current goals can now be found in the care plan section)  Progress towards OT goals: Progressing toward goals  Acute Rehab OT Goals Patient Stated Goal: to go home  OT Goal Formulation: With patient Time For Goal Achievement: 11/21/17 Potential to Achieve Goals: Good  Plan Discharge plan remains appropriate                     AM-PAC PT "6 Clicks" Daily Activity     Outcome Measure   Help from another person eating meals?: None Help from another person taking care of personal grooming?: A Little Help from another person toileting, which includes using toliet, bedpan, or urinal?: A Little Help from another person bathing (including washing, rinsing, drying)?: A Lot Help from another person to put on and taking off regular upper body clothing?: A Little Help from another person to put on and taking off regular lower body clothing?: A Lot 6 Click Score: 17    End of Session Equipment Utilized During Treatment: Gait belt;Back brace;Rolling walker  OT Visit Diagnosis: Other abnormalities of gait and mobility (R26.89)   Activity Tolerance Patient tolerated treatment well   Patient Left with call bell/phone within reach   Nurse Communication Mobility status        Time: 6294-7654 OT Time Calculation (min): 33 min  Charges: OT General Charges $OT Visit: 1 Visit OT Treatments $Self Care/Home Management : 8-22 mins $Therapeutic Activity: 8-22  mins  Lou Cal, OT Pager [phone] 11/15/2017    Raymondo Band 11/15/2017, 3:20 PM

## 2017-11-15 NOTE — Progress Notes (Signed)
Patient ID: Victoria Molina, female   DOB: January 06, 1946, 72 y.o.   MRN: [id number] BP (!) 114/42   Pulse 79   Temp 98.2 F (36.8 C) (Oral)   Resp 18   Ht 5\' 2"  (1.575 m)   Wt 86.3 kg (190 lb 4.1 oz)   SpO2 98%   BMI 34.80 kg/m  Alert and oriented x 4, speech is clear and fluent Moving all extremites Working with therapy Wound is clean, and dry, no signs of infection Continue PT

## 2017-11-16 NOTE — Progress Notes (Signed)
Physical Therapy Treatment Patient Details Name: Victoria Molina MRN: [id number] DOB: [birth year] Today's Date: 11/16/2017    History of Present Illness 72yo female s/p posterior lumbar fusion L4-L5 done 11/13/17. PMH anxiety, OA, LBP, claustrophobia, h/o hernia, hx L shoulder tendon injury, hx back surgery, lumbar disc surgery, hx prior lumbar fusion     PT Comments    Pt pleasant and willing to work with PT today. Pt limited in safe mobility by increase in pain with gait. Pt currently supervision for transfers, and min guard for ambulation of 200 feet with RW. Pt experience increasing pain with gait today and stair training deferred. Pt able to recall 3/3 back precautions during session. D/c plans remain appropriate at this time. PT will continue to follow acutely until d/c.     Follow Up Recommendations  Outpatient PT     Equipment Recommendations  None recommended by PT    Recommendations for Other Services       Precautions / Restrictions Precautions Precautions: Fall;Back Precaution Booklet Issued: Yes (comment) Precaution Comments: needs lumbar brace  Required Braces or Orthoses: Spinal Brace Spinal Brace: Applied in sitting position Spinal Brace Comments: Can have off for quick trips to bathroom and showering and in bed Restrictions Weight Bearing Restrictions: No    Mobility  Bed Mobility               General bed mobility comments: seated EoB on entry  Transfers Overall transfer level: Needs assistance Equipment used: Rolling walker (2 wheeled) Transfers: Sit to/from Stand Sit to Stand: Supervision         General transfer comment: VC for safety and sequencing   Ambulation/Gait Ambulation/Gait assistance: Supervision Ambulation Distance (Feet): 200 Feet Assistive device: Rolling walker (2 wheeled) Gait Pattern/deviations: Step-through pattern;Decreased step length - right;Decreased step length - left;Trunk flexed Gait velocity: slow Gait velocity  interpretation: at or above normal speed for age/gender General Gait Details: patient slow but steady with RW    Stairs         General stair comments: Pt with increase in pain with gait, further stair training deferred this session  Wheelchair Mobility    Modified Rankin (Stroke Patients Only)       Balance Overall balance assessment: Needs assistance Sitting-balance support: Bilateral upper extremity supported;Feet supported Sitting balance-Leahy Scale: Good     Standing balance support: Bilateral upper extremity supported;During functional activity;No upper extremity supported Standing balance-Leahy Scale: Fair Standing balance comment: Pt maintaining static standing without UE support for donning gown across back                             Cognition Arousal/Alertness: Awake/alert Behavior During Therapy: WFL for tasks assessed/performed Overall Cognitive Status: Within Functional Limits for tasks assessed                                               Pertinent Vitals/Pain Pain Assessment: 0-10 Pain Score: 4  Pain Location: low back  Pain Descriptors / Indicators: Aching;Sore;Burning           PT Goals (current goals can now be found in the care plan section) Acute Rehab PT Goals Patient Stated Goal: to go home  PT Goal Formulation: With patient Time For Goal Achievement: 11/21/17 Potential to Achieve Goals: Good    Frequency  Min 5X/week      PT Plan Current plan remains appropriate       AM-PAC PT "6 Clicks" Daily Activity  Outcome Measure  Difficulty turning over in bed (including adjusting bedclothes, sheets and blankets)?: None Difficulty moving from lying on back to sitting on the side of the bed? : None Difficulty sitting down on and standing up from a chair with arms (e.g., wheelchair, bedside commode, etc,.)?: None Help needed moving to and from a bed to chair (including a wheelchair)?: None Help  needed walking in hospital room?: A Little Help needed climbing 3-5 steps with a railing? : A Little 6 Click Score: 22    End of Session Equipment Utilized During Treatment: Gait belt;Back brace Activity Tolerance: Patient tolerated treatment well Patient left: in chair;with call bell/phone within reach Nurse Communication: Mobility status PT Visit Diagnosis: Unsteadiness on feet (R26.81);Muscle weakness (generalized) (M62.81);Difficulty in walking, not elsewhere classified (R26.2)     Time: 4854-6270 PT Time Calculation (min) (ACUTE ONLY): 11 min  Charges:  $Gait Training: 8-22 mins                    G Codes:       Karey Suthers B. Migdalia Dk PT, DPT Acute Rehabilitation  [phone] Pager [phone]     [place] 11/16/2017, 11:13 AM

## 2017-11-16 NOTE — Progress Notes (Signed)
Neurosurgery Progress Note  No issues overnight.  No concerns this am Pain adequately controlled Tolerating po Voiding normal  EXAM:  BP (!) 121/57 (BP Location: Right Arm)   Pulse 72   Temp 97.7 F (36.5 C) (Oral)   Resp 18   Ht 5\' 2"  (1.575 m)   Wt 86.3 kg (190 lb 4.1 oz)   SpO2 95%   BMI 34.80 kg/m   Awake, alert, oriented  Speech fluent, appropriate  CN grossly intact  MAEW with good strength Wound c/d/i  PLAN Progressing nicely Continue therapy and current care

## 2017-11-17 MED ORDER — FLEET ENEMA 7-19 GM/118ML RE ENEM
1.0000 | ENEMA | Freq: Once | RECTAL | Status: AC
  Administered 2017-11-17: 1 via RECTAL
  Filled 2017-11-17: qty 1

## 2017-11-17 MED ORDER — MAGNESIUM CITRATE PO SOLN
1.0000 | Freq: Once | ORAL | Status: AC
  Administered 2017-11-17: 1 via ORAL
  Filled 2017-11-17: qty 296

## 2017-11-17 NOTE — Progress Notes (Signed)
Physical Therapy Treatment Patient Details Name: Victoria Molina MRN: [id number] DOB: 1945-11-06 Today's Date: 11/17/2017    History of Present Illness 72yo female s/p posterior lumbar fusion L4-L5 done 11/13/17. PMH anxiety, OA, LBP, claustrophobia, h/o hernia, hx L shoulder tendon injury, hx back surgery, lumbar disc surgery, hx prior lumbar fusion     PT Comments    Pt progressing well with mobility. Able to ascend/descend stairs well.  I have encouraged the patient to gradually increase activity daily to tolerance.  I encouraged pt to walk several more times with RN staff today. Pt with no particular concerns about mobility at this time.      Follow Up Recommendations  Home health PT;Outpatient PT;Supervision - Intermittent(HH vs outpt-per surgeon. )     Equipment Recommendations  None recommended by PT    Recommendations for Other Services       Precautions / Restrictions Precautions Precautions: Fall;Back Precaution Booklet Issued: Yes (comment) Precaution Comments: Pt able to state BLT precautions.   Required Braces or Orthoses: Spinal Brace Spinal Brace: Applied in sitting position Spinal Brace Comments: Pt able to don brace with set-up help only standing EOB Restrictions Weight Bearing Restrictions: No    Mobility  Bed Mobility Overal bed mobility: (NT, pt up with nursing when PT arrived)                Transfers Overall transfer level: Needs assistance Equipment used: Rolling walker (2 wheeled) Transfers: Sit to/from Stand Sit to Stand: Supervision         General transfer comment: VC for safety and hand placement  Ambulation/Gait Ambulation/Gait assistance: Supervision Ambulation Distance (Feet): 250 Feet Assistive device: Rolling walker (2 wheeled) Gait Pattern/deviations: Step-through pattern;Decreased stride length Gait velocity: not formally assessed   General Gait Details: no loss of balance wih mobility   Stairs Stairs: Yes   Stair  Management: One rail Right Number of Stairs: 3 General stair comments: pt reports she has no rai, but holds to screen door. Also demonstrated to pt how she could hold to caregiver to make going up and down stairs safer.   Wheelchair Mobility    Modified Rankin (Stroke Patients Only)       Balance                                            Cognition Arousal/Alertness: Awake/alert Behavior During Therapy: WFL for tasks assessed/performed Overall Cognitive Status: Within Functional Limits for tasks assessed                                        Exercises      General Comments General comments (skin integrity, edema, etc.): No family present. Pt reports her husband wants her to go to SNF for rehab, but she prefers home with [facility] therapy. .       Pertinent Vitals/Pain Pain Assessment: 0-10 Pain Score: 5  Pain Location: low back Pain Descriptors / Indicators: Aching;Sore;Burning Pain Intervention(s): Monitored during session;Limited activity within patient's tolerance;Patient requesting pain meds-RN notified    Home Living                      Prior Function            PT Goals (current goals can now  be found in the care plan section) Acute Rehab PT Goals Patient Stated Goal: to go home  Progress towards PT goals: Progressing toward goals    Frequency    Min 5X/week      PT Plan Current plan remains appropriate    Co-evaluation              AM-PAC PT "6 Clicks" Daily Activity  Outcome Measure  Difficulty turning over in bed (including adjusting bedclothes, sheets and blankets)?: None Difficulty moving from lying on back to sitting on the side of the bed? : None Difficulty sitting down on and standing up from a chair with arms (e.g., wheelchair, bedside commode, etc,.)?: None Help needed moving to and from a bed to chair (including a wheelchair)?: None Help needed walking in hospital room?: A Little Help  needed climbing 3-5 steps with a railing? : A Little 6 Click Score: 22    End of Session Equipment Utilized During Treatment: Gait belt;Back brace Activity Tolerance: Patient tolerated treatment well Patient left: in chair;with call bell/phone within reach Nurse Communication: Mobility status PT Visit Diagnosis: Unsteadiness on feet (R26.81);Muscle weakness (generalized) (M62.81);Difficulty in walking, not elsewhere classified (R26.2)     Time: 5176-1607 PT Time Calculation (min) (ACUTE ONLY): 19 min  Charges:  $Gait Training: 23-37 mins                    G CodesLavonia Molina, [place]  [phone] 11/17/2017    Victoria Molina 11/17/2017, 8:35 AM

## 2017-11-17 NOTE — Progress Notes (Signed)
Occupational Therapy Treatment Patient Details Name: Victoria Molina MRN: [id number] DOB: Feb 04, 1946 Today's Date: 11/17/2017    History of present illness 72yo female s/p posterior lumbar fusion L4-L5 done 11/13/17. PMH anxiety, OA, LBP, claustrophobia, h/o hernia, hx L shoulder tendon injury, hx back surgery, lumbar disc surgery, hx prior lumbar fusion    OT comments  Pt progressing towards goals this session. Pt able to demonstrate toilet transfer and peri care along with sink level grooming. Pt able to recall 3/3 back precautions this session and had good carryover for safe hand placement from earlier PT session. Pt also requested Out of room mobility, which OT was happy to assist with. OT also reviewed compensatory strategies for LB ADL, Pt verbally confirmed understanding. OT will continue to follow in the acute setting.    Follow Up Recommendations  No OT follow up    Equipment Recommendations  None recommended by OT    Recommendations for Other Services      Precautions / Restrictions Precautions Precautions: Fall;Back Precaution Booklet Issued: Yes (comment) Precaution Comments: Pt able to recall 3/3 precautions.   Required Braces or Orthoses: Spinal Brace Spinal Brace: Applied in sitting position Spinal Brace Comments: Pt able to don brace with set-up help only standing EOB Restrictions Weight Bearing Restrictions: No       Mobility Bed Mobility               General bed mobility comments: Pt OOB in recliner when OT entered the room  Transfers Overall transfer level: Needs assistance Equipment used: Rolling walker (2 wheeled) Transfers: Sit to/from Stand Sit to Stand: Supervision         General transfer comment: good hand placement and carryover from previous PT session    Balance Overall balance assessment: Needs assistance Sitting-balance support: Bilateral upper extremity supported;Feet supported Sitting balance-Leahy Scale: Good     Standing  balance support: Bilateral upper extremity supported;During functional activity;No upper extremity supported Standing balance-Leahy Scale: Fair Standing balance comment: Pt able to perform sink level grooming without BUE support                           ADL either performed or assessed with clinical judgement   ADL Overall ADL's : Needs assistance/impaired     Grooming: Wash/dry hands;Min guard;Standing Grooming Details (indicate cue type and reason): sink level - good recall of cup method for oral care                 Toilet Transfer: Supervision/safety;Ambulation;RW Toilet Transfer Details (indicate cue type and reason): no assist needed, safe hand placement observed Toileting- Clothing Manipulation and Hygiene: Min guard;Sit to/from stand Toileting - Clothing Manipulation Details (indicate cue type and reason): Pt able to perform front peri care without assist, reviewed compensatory stragies for rear peri care     Functional mobility during ADLs: Supervision/safety;Rolling walker       Vision       Perception     Praxis      Cognition Arousal/Alertness: Awake/alert Behavior During Therapy: WFL for tasks assessed/performed Overall Cognitive Status: Within Functional Limits for tasks assessed                                          Exercises     Shoulder Instructions       General Comments No family  present during session.    Pertinent Vitals/ Pain       Pain Assessment: 0-10 Pain Score: 5  Pain Location: low back Pain Descriptors / Indicators: Aching;Sore;Burning Pain Intervention(s): Monitored during session;Repositioned;Premedicated before session  Home Living                                          Prior Functioning/Environment              Frequency  Min 2X/week        Progress Toward Goals  OT Goals(current goals can now be found in the care plan section)  Progress towards OT goals:  Progressing toward goals  Acute Rehab OT Goals Patient Stated Goal: to go home  OT Goal Formulation: With patient Time For Goal Achievement: 11/21/17 Potential to Achieve Goals: Good  Plan Discharge plan remains appropriate;Frequency remains appropriate    Co-evaluation                 AM-PAC PT "6 Clicks" Daily Activity     Outcome Measure   Help from another person eating meals?: None Help from another person taking care of personal grooming?: A Little Help from another person toileting, which includes using toliet, bedpan, or urinal?: A Little Help from another person bathing (including washing, rinsing, drying)?: A Lot Help from another person to put on and taking off regular upper body clothing?: A Little Help from another person to put on and taking off regular lower body clothing?: A Lot 6 Click Score: 17    End of Session Equipment Utilized During Treatment: Gait belt;Back brace;Rolling walker  OT Visit Diagnosis: Other abnormalities of gait and mobility (R26.89)   Activity Tolerance Patient tolerated treatment well   Patient Left in chair;with call bell/phone within reach   Nurse Communication Mobility status        Time: 6384-5364 OT Time Calculation (min): 27 min  Charges: OT General Charges $OT Visit: 1 Visit OT Treatments $Self Care/Home Management : 23-37 mins  Hulda Humphrey OTR/L [phone]   [place] Ray Sharryn Belding 11/17/2017, 5:33 PM

## 2017-11-17 NOTE — Progress Notes (Signed)
Neurosurgery Progress Note  No issues overnight.  Pain is manageable Tolerating po Voiding normal Does not feel comfortable going home  EXAM:  BP (!) 124/54 (BP Location: Right Arm)   Pulse 69   Temp 98.4 F (36.9 C) (Oral)   Resp 18   Ht 5\' 2"  (1.575 m)   Wt 86.3 kg (190 lb 4.1 oz)   SpO2 93%   BMI 34.80 kg/m    Awake, alert, oriented  Speech fluent, appropriate  CN grossly intact  MAEW with good strength Wound c/d/i  PLAN Progressing nicely Continue therapy and current care Hopefully d/c tomorrow

## 2017-11-18 NOTE — Care Management Important Message (Signed)
Important Message  Patient Details  Name: Victoria Molina MRN: [id number] Date of Birth: 10-Apr-1946   Medicare Important Message Given:  Yes    Orbie Pyo 11/18/2017, 2:13 PM

## 2017-11-18 NOTE — Progress Notes (Signed)
Interventions For constipation has been successful. Patient has had  BM this morning already. She is up walking around the room and tolerating it well.

## 2017-11-18 NOTE — Progress Notes (Signed)
Pt c/o of being constipated, no BM for quite a while, pt was given Mag Citrate and Enema during day shift with little effect, warm prune juice given at 2030 and fleet enema also given at 2218, pt reassured, will however continue to monitor. Obas[place]-Asidi, Jemima Petko Efe

## 2017-11-18 NOTE — Progress Notes (Signed)
Patient ID: Victoria Molina, female   DOB: [birth year], 72 y.o.   MRN: [id number] BP 127/61 (BP Location: Right Arm)   Pulse 79   Temp 98.5 F (36.9 C) (Oral)   Resp 18   Ht 5\' 2"  (1.575 m)   Wt 86.3 kg (190 lb 4.1 oz)   SpO2 99%   BMI 34.80 kg/m  Alert and oriented x 4. Speech is clear and fluent Wound is clean, dry, no signs of infection Will discharge tomorrow

## 2017-11-18 NOTE — Progress Notes (Signed)
Physical Therapy Treatment Patient Details Name: BRITTNEY MUCHA MRN: [id number] DOB: 1946-04-12 Today's Date: 11/18/2017    History of Present Illness 72yo female s/p posterior lumbar fusion L4-L5 done 11/13/17. PMH anxiety, OA, LBP, claustrophobia, h/o hernia, hx L shoulder tendon injury, hx back surgery, lumbar disc surgery, hx prior lumbar fusion     PT Comments    Patient progressing well towards PT goals. Reports increased pain in left side of back after trying to disimpact herself yesterday and doing lots of bending and twisting. Able to independently recall 3/3 back precautions but does need cues to adhere during functional mobility. Tolerated stair training as well today. Encouraged increasing ambulation and mobility. Will follow.    Follow Up Recommendations  Outpatient PT;Supervision - Intermittent(outpt per surgeon)     Equipment Recommendations  None recommended by PT    Recommendations for Other Services       Precautions / Restrictions Precautions Precautions: Fall;Back Precaution Booklet Issued: Yes (comment) Precaution Comments: Pt able to recall 3/3 precautions.   Required Braces or Orthoses: Spinal Brace Spinal Brace: Applied in sitting position Spinal Brace Comments: Pt able to don brace with set-up. Restrictions Weight Bearing Restrictions: No    Mobility  Bed Mobility Overal bed mobility: Needs Assistance Bed Mobility: Rolling;Sidelying to Sit Rolling: Supervision Sidelying to sit: Supervision;HOB elevated       General bed mobility comments: Good demo of log roll technique, no assist needed.   Transfers Overall transfer level: Needs assistance Equipment used: Rolling walker (2 wheeled) Transfers: Sit to/from Stand Sit to Stand: Supervision         General transfer comment: Supervision for safety. Stood from [employer].   Ambulation/Gait Ambulation/Gait assistance: Supervision Ambulation Distance (Feet): 350 Feet Assistive device: Rolling  walker (2 wheeled) Gait Pattern/deviations: Step-through pattern;Decreased stride length;Trunk flexed   Gait velocity interpretation: at or above normal speed for age/gender General Gait Details: Slow, steady gait with no evidence of LOB.    Stairs Stairs: Yes   Stair Management: No rails Number of Stairs: 3 General stair comments: Able to negotiate stairs without UE support today. Close min guard for safety.   Wheelchair Mobility    Modified Rankin (Stroke Patients Only)       Balance Overall balance assessment: Needs assistance Sitting-balance support: Feet supported;No upper extremity supported Sitting balance-Leahy Scale: Good Sitting balance - Comments: Able to donn brace with setup.   Standing balance support: During functional activity;Bilateral upper extremity supported Standing balance-Leahy Scale: Fair Standing balance comment: Pt able to stand statically without UE support; but prefers UE support for walking.                            Cognition Arousal/Alertness: Awake/alert Behavior During Therapy: WFL for tasks assessed/performed Overall Cognitive Status: Within Functional Limits for tasks assessed                                        Exercises      General Comments General comments (skin integrity, edema, etc.): Reports having a sore bottom due to constipation and impaction.       Pertinent Vitals/Pain Pain Assessment: 0-10 Pain Score: 4  Pain Location: low back on left Pain Descriptors / Indicators: Aching;Sore;Operative site guarding Pain Intervention(s): Monitored during session;Repositioned;Premedicated before session    Home Living  Prior Function            PT Goals (current goals can now be found in the care plan section) Progress towards PT goals: Progressing toward goals    Frequency    Min 5X/week      PT Plan Current plan remains appropriate    Co-evaluation               AM-PAC PT "6 Clicks" Daily Activity  Outcome Measure  Difficulty turning over in bed (including adjusting bedclothes, sheets and blankets)?: None Difficulty moving from lying on back to sitting on the side of the bed? : None Difficulty sitting down on and standing up from a chair with arms (e.g., wheelchair, bedside commode, etc,.)?: None Help needed moving to and from a bed to chair (including a wheelchair)?: None Help needed walking in hospital room?: None Help needed climbing 3-5 steps with a railing? : A Little 6 Click Score: 23    End of Session Equipment Utilized During Treatment: Gait belt;Back brace Activity Tolerance: Patient tolerated treatment well Patient left: in bed;with call bell/phone within reach;with family/visitor present(sitting EOB.) Nurse Communication: Mobility status PT Visit Diagnosis: Unsteadiness on feet (R26.81);Muscle weakness (generalized) (M62.81);Difficulty in walking, not elsewhere classified (R26.2)     Time: 2778-2423 PT Time Calculation (min) (ACUTE ONLY): 17 min  Charges:  $Gait Training: 8-22 mins                    G Codes:       Wray Kearns, PT, DPT [phone]     [place] 11/18/2017, 1:50 PM

## 2017-11-19 NOTE — Progress Notes (Signed)
Nurse went over discharge with patient and family. Patient and family verbalized understanding. All questions and concerns addressed. All belongings sent with husband.  Discharging home and taken down in a wheelchair.

## 2017-11-19 NOTE — Care Management Note (Signed)
Case Management Note  Patient Details  Name: Victoria Molina MRN: [id number] Date of Birth: February 18, 1946  Subjective/Objective:                 Patient with order to DC to home today. Spoke to patient, she states she has a RW a home.  Chart reviewed. No OP, HH DME orders, none identified in DC summary. No unacknowledged Case Management consults or medication needs identified at the time of this note. Plan for DC to home.  Action/Plan:   Expected Discharge Date:  11/19/17               Expected Discharge Plan:  Home/Self Care  In-House Referral:     Discharge planning Services  CM Consult  Post Acute Care Choice:    Choice offered to:     DME Arranged:    DME Agency:     HH Arranged:    HH Agency:     Status of Service:  Completed, signed off  If discussed at [employer] of Stay Meetings, dates discussed:    Additional Comments:  Carles Collet, RN 11/19/2017, 10:12 AM

## 2017-11-19 NOTE — Progress Notes (Signed)
Physical Therapy Treatment Patient Details Name: Victoria Molina MRN: [id number] DOB: 1945-11-02 Today's Date: 11/19/2017    History of Present Illness 71yo female s/p posterior lumbar fusion L4-L5 done 11/13/17. PMH anxiety, OA, LBP, claustrophobia, h/o hernia, hx L shoulder tendon injury, hx back surgery, lumbar disc surgery, hx prior lumbar fusion     PT Comments    Patient feeling better today. Reports pain decreased from yesterday. Able to perform bed mobility to simulate home environment with HOB flat, no rails and with log roll technique. Discussed safe stair negotiation with assist from spouse. Able to independently state 3/3 back precautions and adhere to them during functional mobility. Eager to return home.  Plans to d/c today. Will follow if still in hospital.   Follow Up Recommendations  Outpatient PT;Supervision - Intermittent     Equipment Recommendations  None recommended by PT    Recommendations for Other Services       Precautions / Restrictions Precautions Precautions: Fall;Back Precaution Booklet Issued: Yes (comment) Precaution Comments: Pt able to recall 3/3 precautions.   Required Braces or Orthoses: Spinal Brace Spinal Brace: Applied in sitting position Restrictions Weight Bearing Restrictions: No    Mobility  Bed Mobility Overal bed mobility: Needs Assistance Bed Mobility: Rolling;Sidelying to Sit Rolling: Modified independent (Device/Increase time) Sidelying to sit: Modified independent (Device/Increase time)       General bed mobility comments: Good demo of log roll technique, no assist needed. HOB flat, no use of rails.   Transfers Overall transfer level: Needs assistance Equipment used: Rolling walker (2 wheeled) Transfers: Sit to/from Stand Sit to Stand: Supervision         General transfer comment: Supervision for safety. Stood from [employer]. Transferred to chair post ambulation.  Ambulation/Gait Ambulation/Gait assistance:  Supervision Ambulation Distance (Feet): 400 Feet Assistive device: Rolling walker (2 wheeled) Gait Pattern/deviations: Step-through pattern;Decreased stride length;Trunk flexed   Gait velocity interpretation: at or above normal speed for age/gender General Gait Details: Slow, steady gait with no evidence of LOB.    Stairs            Wheelchair Mobility    Modified Rankin (Stroke Patients Only)       Balance Overall balance assessment: Needs assistance Sitting-balance support: Feet supported;No upper extremity supported Sitting balance-Leahy Scale: Good Sitting balance - Comments: Able to donn brace without assist.    Standing balance support: During functional activity;Bilateral upper extremity supported Standing balance-Leahy Scale: Fair Standing balance comment: Pt able to stand statically without UE support; but prefers UE support for walking.                            Cognition Arousal/Alertness: Awake/alert Behavior During Therapy: WFL for tasks assessed/performed Overall Cognitive Status: Within Functional Limits for tasks assessed                                        Exercises      General Comments        Pertinent Vitals/Pain Pain Assessment: Faces Faces Pain Scale: Hurts little more Pain Location: low back on left Pain Descriptors / Indicators: Aching;Sore;Operative site guarding Pain Intervention(s): Monitored during session;Repositioned;Premedicated before session    Home Living                      Prior Function  PT Goals (current goals can now be found in the care plan section) Progress towards PT goals: Progressing toward goals    Frequency    Min 5X/week      PT Plan Current plan remains appropriate    Co-evaluation              AM-PAC PT "6 Clicks" Daily Activity  Outcome Measure  Difficulty turning over in bed (including adjusting bedclothes, sheets and blankets)?:  None Difficulty moving from lying on back to sitting on the side of the bed? : None Difficulty sitting down on and standing up from a chair with arms (e.g., wheelchair, bedside commode, etc,.)?: None Help needed moving to and from a bed to chair (including a wheelchair)?: None Help needed walking in hospital room?: None Help needed climbing 3-5 steps with a railing? : A Little 6 Click Score: 23    End of Session Equipment Utilized During Treatment: Gait belt;Back brace Activity Tolerance: Patient tolerated treatment well Patient left: in chair;with call bell/phone within reach Nurse Communication: Mobility status PT Visit Diagnosis: Unsteadiness on feet (R26.81);Muscle weakness (generalized) (M62.81);Difficulty in walking, not elsewhere classified (R26.2)     Time: 3086-5784 PT Time Calculation (min) (ACUTE ONLY): 15 min  Charges:  $Gait Training: 8-22 mins                    G Codes:       Wray Kearns, PT, DPT [phone]     [place] 11/19/2017, 9:08 AM

## 2017-11-19 NOTE — Discharge Summary (Signed)
Physician Discharge Summary  Patient ID: Victoria Molina MRN: [id number] DOB/AGE: 1946/02/27 72 y.o.  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 Post op she has done very well. She is working with PT, OT, and has voided, ambulated, and is tolerating a regular diet.   Treatments: surgery: Lumbar decompression L4/5, posterolateral arthrodesis, pedicle screw fixation  Discharge Exam: Blood pressure 133/63, pulse 65, temperature 98.2 F (36.8 C), temperature source Oral, resp. rate 18, height 5\' 2"  (1.575 m), weight 86.3 kg (190 lb 4.1 oz), SpO2 97 %. General appearance: alert, cooperative, appears stated age and no distress Neurologic: Alert and oriented X 3, normal strength and tone. Normal symmetric reflexes. Normal coordination and gait  Disposition: 01-Home or Self Care SYNOVIAL CYST OF LUMBAR FACET JOINT Discharge Instructions    Call MD for:  persistant nausea and vomiting   Complete by:  As directed    Call MD for:  redness, tenderness, or signs of infection (pain, swelling, redness, odor or green/yellow discharge around incision site)   Complete by:  As directed    Call MD for:  severe uncontrolled pain   Complete by:  As directed    Call MD for:  temperature >100.4   Complete by:  As directed    Diet - low sodium heart healthy   Complete by:  As directed    Diet general   Complete by:  As directed    Discharge instructions   Complete by:  As directed    Spinal Fusion Care After Refer to this sheet in the next few weeks. These instructions provide you with information on  caring for yourself after your procedure. Your caregiver may also give you more specific instructions. Your treatment has been planned according to current medical practices, but problems sometimes occur. Call your caregiver if you have any problems or questions after your procedure. HOME CARE INSTRUCTIONS  Take whatever pain medicine has been prescribed by your caregiver. Do not take over-the-counter pain medicine unless directed otherwise by your caregiver.  Do not drive if you are taking narcotic pain medicines.  Change your bandage (dressing) if necessary or as directed by your caregiver.  You may shower. The wound may get wet, simply pat the area dry. It will take ~2 weeks for the glue to peel off. If you have been prescribed medicine to prevent your blood from clotting, follow the directions carefully.  Check the area around your incision often. Look for redness and swelling. Also, look for anything leaking from your wound. You can use a mirror or have a family member inspect your incision if it is in a place where it is difficult for you to see.  Ask your caregiver what activities you should avoid and for how long.  Walk as much as possible.  Do not lift anything heavier than 5 lbs until your caregiver says it is safe.  Do not twist or bend for a few weeks. Try not to pull on things. Avoid sitting for long periods of time. Change positions at least every hour.   Discharge wound care:   Complete by:  As directed    You  may shower, pat incision dry. No baths   Driving Restrictions   Complete by:  As directed    No driving for one week   Increase activity slowly   Complete by:  As directed    Lifting restrictions   Complete by:  As directed    No lifting greater than 5lbs   No dressing needed   Complete by:  As directed      Allergies as of 11/19/2017      Reactions   Iodinated Diagnostic Agents Hives, Itching, Other (See Comments)   Pt pre-meds with Benadryl 50mg  PO; tolerates  ESI's.  jkl   Percocet [oxycodone-acetaminophen] Other (See Comments)   hallucinations   Sulfa Antibiotics Hives, Itching, Rash   Contrast Media [iodinated Diagnostic Agents] Rash   Iodine Rash   Iodine-131 Rash   Sulfamethoxazole Rash   Vioxx [rofecoxib] Swelling, Other (See Comments)   Swelling of legs and feet      Medication List    TAKE these medications   acetaminophen 650 MG CR tablet Commonly known as:  TYLENOL Take 650 mg by mouth every 8 (eight) hours as needed for pain.   ALPRAZolam 0.5 MG tablet Commonly known as:  XANAX Take 0.5 mg by mouth 3 (three) times daily as needed for anxiety (for claustrophobia).   amLODipine 5 MG tablet Commonly known as:  NORVASC Take 5 mg by mouth daily.   CHOLEST CARE PO Take 1 tablet by mouth daily.   CINNAMON PO Take 2 capsules by mouth daily.   cyclobenzaprine 5 MG tablet Commonly known as:  FLEXERIL Take 2 tablets (10 mg total) by mouth 3 (three) times daily as needed for muscle spasms.   Fish Oil 1200 MG Caps Take 1,200 mg by mouth daily.   isosorbide mononitrate 30 MG 24 hr tablet Commonly known as:  IMDUR Take 30 mg by mouth daily.   LEG VEIN & CIRCULATION PO Take 1 tablet by mouth daily.   linaclotide 72 MCG capsule Commonly known as:  LINZESS Take 1 capsule (72 mcg total) by mouth daily before breakfast.   loratadine 10 MG tablet Commonly known as:  CLARITIN Take 10 mg by mouth daily.   meloxicam 7.5 MG tablet Commonly known as:  MOBIC Take 1 tablet (7.5 mg total) by mouth daily.   multivitamin with minerals tablet Take 1 tablet by mouth daily.   omeprazole 20 MG capsule Commonly known as:  PRILOSEC TAKE ONE CAPSULE BY MOUTH ONCE DAILY BEFORE BREAKFAST What changed:    how much to take  how to take this  when to take this  additional instructions   polyethylene glycol powder powder Commonly known as:  GLYCOLAX/MIRALAX MIX 17 GRAMS IN LIQUID AND DRINK DAILY AS NEEDED FOR CONSTIPATION    Turmeric Curcumin 500 MG Caps Take 500 mg by mouth daily.   vitamin B-12 1000 MCG tablet Commonly known as:  CYANOCOBALAMIN Take 1,000 mcg by mouth daily.            Discharge Care Instructions  (From admission, onward)        Start     Ordered   11/19/17 0000  Discharge wound care:    Comments:  You may shower, pat incision dry. No baths   11/19/17 0920       Signed: Hazaiah [place] L 11/19/2017, 9:20 AM

## 2018-01-23 ENCOUNTER — Ambulatory Visit (INDEPENDENT_AMBULATORY_CARE_PROVIDER_SITE_OTHER): Payer: Medicare Other | Admitting: Gastroenterology

## 2018-01-23 ENCOUNTER — Encounter: Payer: Self-pay | Admitting: Gastroenterology

## 2018-01-23 DIAGNOSIS — E669 Obesity, unspecified: Secondary | ICD-10-CM | POA: Diagnosis not present

## 2018-01-23 DIAGNOSIS — K219 Gastro-esophageal reflux disease without esophagitis: Secondary | ICD-10-CM | POA: Diagnosis not present

## 2018-01-23 DIAGNOSIS — K5901 Slow transit constipation: Secondary | ICD-10-CM

## 2018-01-23 DIAGNOSIS — R1310 Dysphagia, unspecified: Secondary | ICD-10-CM

## 2018-01-23 DIAGNOSIS — R1319 Other dysphagia: Secondary | ICD-10-CM

## 2018-01-23 MED ORDER — LUBIPROSTONE 24 MCG PO CAPS: 24.0000 ug | ORAL_CAPSULE | Freq: Two times a day (BID) | ORAL | 11 refills | Status: DC

## 2018-01-23 NOTE — Patient Instructions (Addendum)
DRINK WATER TO KEEP YOUR URINE LIGHT YELLOW.   CONTINUE YOUR WEIGHT LOSS EFFORTS. Eat BREAKFAST THEN 3 HRS LATER A HIGH PROTEIN SNACK. EAT LUNCH THEN 3 HOURS LATER A HIGH PROTEIN SNACK. EAT SUPPER AND 2 HOUR LATER A HIGH PROTEIN SNACK. SEE HANDOUT.  FOLLOW A HIGH FIBER DIET. AVOID ITEMS THAT CAUSE BLOATING & GAS. Se handout.  USE AMITIZA EVERY NIGHT FOR 7 DAYS THEN TWICE DAILY. IT TREATS CONSTIPATION BUT MAY CAUSE NAUSEA.  CONTINUE OMEPRAZOLE.  TAKE 30 MINUTES PRIOR TO YOUR FIRST MEAL.  FOLLOW UP IN 4 MOS.

## 2018-01-23 NOTE — Assessment & Plan Note (Signed)
SYMPTOMS CONTROLLED/RESOLVED.  CONTINUE TO MONITOR SYMPTOMS. 

## 2018-01-23 NOTE — Progress Notes (Signed)
ON RECALL  °

## 2018-01-23 NOTE — Progress Notes (Signed)
cc'ed to pcp °

## 2018-01-23 NOTE — Assessment & Plan Note (Signed)
SYMPTOMS CONTROLLED/RESOLVED.  DRINK WATER TO KEEP YOUR URINE LIGHT YELLOW. CONTINUE YOUR WEIGHT LOSS EFFORTS. Eat BREAKFAST THEN 3 HRS LATER A HIGH PROTEIN SNACK. EAT LUNCH THEN 3 HOURS LATER A HIGH PROTEIN SNACK. EAT SUPPER AND 2 HOUR LATER A HIGH PROTEIN SNACK. SEE HANDOUT. CONTINUE OMEPRAZOLE.  TAKE 30 MINUTES PRIOR TO YOUR FIRST MEAL.  FOLLOW UP IN 4 MOS.

## 2018-01-23 NOTE — Assessment & Plan Note (Addendum)
SYMPTOMS NOT IDEALLY CONTROLLED AND MIRALAX NOT COVERED,LINZESS 72 MCG NOT WORKING/$40 [employer].  DRINK WATER TO KEEP YOUR URINE LIGHT YELLOW. CONTINUE YOUR WEIGHT LOSS EFFORTS. Eat BREAKFAST THEN 3 HRS LATER A HIGH PROTEIN SNACK. EAT LUNCH THEN 3 HOURS LATER A HIGH PROTEIN SNACK. EAT SUPPER AND 2 HOUR LATER A HIGH PROTEIN SNACK. SEE HANDOUT. FOLLOW A HIGH FIBER DIET. AVOID ITEMS THAT CAUSE BLOATING & GAS. See handout. SAMPLE #8 GIVEN. USE AMITIZA EVERY NIGHT FOR 7 DAYS THEN TWICE DAILY. IT TREATS CONSTIPATION BUT MAY CAUSE NAUSEA. FOLLOW UP IN 4 MOS.

## 2018-01-23 NOTE — Assessment & Plan Note (Signed)
Desires to lose weight. Weight up 18 lbs in 5 yrs.  DRINK WATER TO KEEP YOUR URINE LIGHT YELLOW. CONTINUE YOUR WEIGHT LOSS EFFORTS. Eat BREAKFAST THEN 3 HRS LATER A HIGH PROTEIN SNACK. EAT LUNCH THEN 3 HOURS LATER A HIGH PROTEIN SNACK. EAT SUPPER AND 2 HOUR LATER A HIGH PROTEIN SNACK. SEE HANDOUT. FOLLOW A HIGH FIBER DIET. AVOID ITEMS THAT CAUSE BLOATING & GAS. Se handout.  FOLLOW UP IN 4 MOS.
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
## 2018-04-11 ENCOUNTER — Other Ambulatory Visit: Payer: Self-pay | Admitting: Neurosurgery

## 2018-04-11 DIAGNOSIS — M48062 Spinal stenosis, lumbar region with neurogenic claudication: Secondary | ICD-10-CM

## 2018-04-22 ENCOUNTER — Ambulatory Visit
Admission: RE | Admit: 2018-04-22 | Discharge: 2018-04-22 | Disposition: A | Discharge status: Home / Self Care | Payer: Medicare Other | Source: Ambulatory Visit | Attending: Neurosurgery | Admitting: Neurosurgery

## 2018-04-22 DIAGNOSIS — M48062 Spinal stenosis, lumbar region with neurogenic claudication: Secondary | ICD-10-CM

## 2018-04-22 MED ORDER — IOPAMIDOL (ISOVUE-M 200) INJECTION 41%
15.0000 mL | Freq: Once | INTRAMUSCULAR | Status: AC
  Administered 2018-04-22: 15 mL via INTRATHECAL

## 2018-04-22 MED ORDER — DIAZEPAM 5 MG PO TABS: 5.0000 mg | ORAL_TABLET | Freq: Once | ORAL | Status: DC

## 2018-04-22 NOTE — Discharge Instructions (Signed)

## 2018-05-28 ENCOUNTER — Encounter: Payer: Self-pay | Admitting: Gastroenterology

## 2018-05-28 ENCOUNTER — Ambulatory Visit (INDEPENDENT_AMBULATORY_CARE_PROVIDER_SITE_OTHER): Payer: Medicare Other | Admitting: Gastroenterology

## 2018-05-28 DIAGNOSIS — R1011 Right upper quadrant pain: Secondary | ICD-10-CM

## 2018-05-28 DIAGNOSIS — K5901 Slow transit constipation: Secondary | ICD-10-CM | POA: Diagnosis not present

## 2018-05-28 DIAGNOSIS — M48062 Spinal stenosis, lumbar region with neurogenic claudication: Secondary | ICD-10-CM | POA: Diagnosis not present

## 2018-05-28 NOTE — Assessment & Plan Note (Signed)
HAS BEEN HOLDING OFF ON GB SURGERY. CONTINUES WITH DAILY AM RUQ PAIN.  SEE SURGERY TO DISCUSS BENEFITS V. RISKS OF GB SURGERY. GO TO ED FOR WORSENING GB PAIN. DISCUSSED SIGNS AND SYMPTOMS OF ACUTE CHOLECYSTITIS. PT VOICED HER UNDERSTANDING.
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
## 2018-05-28 NOTE — Patient Instructions (Addendum)
DRINK WATER TO KEEP YOUR URINE LIGHT YELLOW.  FOLLOW A HIGH FIBER DIET. AVOID ITEMS THAT CAUSE BLOATING & GAS. SEE INFO BELOW.  CONTINUE STOOL SOFTENER.  ADD SENNA-KOT EVERY MON, WED, FRI OR EVERY MON, FRI TO AVOID CONSTIPATION AND HARD STOOLS.    PLEASE CALL WITH QUESTIONS OR CONCERNS.  FOLLOW UP IN 6 MOS.

## 2018-05-28 NOTE — Assessment & Plan Note (Signed)
SYMPTOMS NOT IDEALLY CONTROLLED.  DRINK WATER TO KEEP YOUR URINE LIGHT YELLOW.  FOLLOW A HIGH FIBER DIET. AVOID ITEMS THAT CAUSE BLOATING & GAS.  HANDOUT GIVEN. CONTINUE STOOL SOFTENER. ADD SENNA-KOT EVERY MON, WED, FRI OR EVERY MON, FRI TO AVOID CONSTIPATION AND HARD STOOLS.   FOLLOW UP IN 6 MOS.

## 2018-05-28 NOTE — Assessment & Plan Note (Signed)
SYMPTOMS NOT IDEALLY CONTROLLED. PT UNABLE TO WORK OUT.  REVIEWED MRI DEC 2018 AND CT JUL 2019. BULGING DISC WORSE. SHE WILL DISCUSS WITH HER ORTHO DOC AT NEXT VISIT.

## 2018-05-29 NOTE — Progress Notes (Signed)
ON RECALL  °

## 2018-07-23 ENCOUNTER — Ambulatory Visit: Payer: Medicare Other | Attending: Neurosurgery

## 2018-07-23 DIAGNOSIS — G8929 Other chronic pain: Secondary | ICD-10-CM

## 2018-07-23 DIAGNOSIS — M545 Low back pain, unspecified: Secondary | ICD-10-CM

## 2018-07-23 DIAGNOSIS — M6281 Muscle weakness (generalized): Secondary | ICD-10-CM | POA: Diagnosis present

## 2018-07-23 NOTE — Patient Instructions (Signed)
Access Code: [email or web address]  URL: [email or web address]  Date: 07/23/2018  Prepared by: Roxana Hires   Exercises  Supine Piriformis Stretch with Leg Straight - 3 sets - 45s on each side hold - 1x daily - 7x weekly  Supine Hamstring Stretch with Strap - 3 sets - 45 seconds on each side hold - 1x daily - 7x weekly  Supine Bridge - 10 reps - 2 sets - 3s hold - 1x daily - 7x weekly
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
## 2018-07-28 ENCOUNTER — Ambulatory Visit: Payer: Medicare Other

## 2018-07-28 DIAGNOSIS — G8929 Other chronic pain: Secondary | ICD-10-CM

## 2018-07-28 DIAGNOSIS — M6281 Muscle weakness (generalized): Secondary | ICD-10-CM

## 2018-07-28 DIAGNOSIS — M545 Low back pain, unspecified: Secondary | ICD-10-CM
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
## 2018-07-30 ENCOUNTER — Ambulatory Visit: Payer: Medicare Other

## 2018-07-30 DIAGNOSIS — M545 Low back pain: Secondary | ICD-10-CM

## 2018-07-30 DIAGNOSIS — G8929 Other chronic pain: Secondary | ICD-10-CM

## 2018-07-30 DIAGNOSIS — M6281 Muscle weakness (generalized): Secondary | ICD-10-CM
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
## 2018-08-07 ENCOUNTER — Encounter: Payer: Self-pay | Admitting: Physical Therapy

## 2018-08-07 ENCOUNTER — Ambulatory Visit: Payer: Medicare Other | Admitting: Physical Therapy

## 2018-08-07 ENCOUNTER — Other Ambulatory Visit: Payer: Self-pay

## 2018-08-07 DIAGNOSIS — M545 Low back pain: Secondary | ICD-10-CM

## 2018-08-07 DIAGNOSIS — G8929 Other chronic pain: Secondary | ICD-10-CM

## 2018-08-07 DIAGNOSIS — M6281 Muscle weakness (generalized): Secondary | ICD-10-CM
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
## 2018-08-12 ENCOUNTER — Encounter: Payer: Self-pay | Admitting: Physical Therapy

## 2018-08-12 ENCOUNTER — Ambulatory Visit: Payer: Medicare Other | Admitting: Physical Therapy

## 2018-08-12 DIAGNOSIS — M545 Low back pain, unspecified: Secondary | ICD-10-CM

## 2018-08-12 DIAGNOSIS — G8929 Other chronic pain: Secondary | ICD-10-CM

## 2018-08-12 DIAGNOSIS — J329 Chronic sinusitis, unspecified: Secondary | ICD-10-CM | POA: Insufficient documentation

## 2018-08-12 DIAGNOSIS — M6281 Muscle weakness (generalized): Secondary | ICD-10-CM
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
## 2018-08-14 ENCOUNTER — Encounter: Payer: Self-pay | Admitting: Physical Therapy

## 2018-08-14 ENCOUNTER — Ambulatory Visit: Payer: Medicare Other | Admitting: Physical Therapy

## 2018-08-14 DIAGNOSIS — M6281 Muscle weakness (generalized): Secondary | ICD-10-CM

## 2018-08-14 DIAGNOSIS — M545 Low back pain: Secondary | ICD-10-CM

## 2018-08-14 DIAGNOSIS — G8929 Other chronic pain: Secondary | ICD-10-CM
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
## 2018-08-18 ENCOUNTER — Ambulatory Visit: Payer: Medicare Other | Admitting: Physical Therapy

## 2018-08-18 ENCOUNTER — Encounter: Payer: Self-pay | Admitting: Physical Therapy

## 2018-08-18 DIAGNOSIS — G8929 Other chronic pain: Secondary | ICD-10-CM

## 2018-08-18 DIAGNOSIS — M6281 Muscle weakness (generalized): Secondary | ICD-10-CM

## 2018-08-18 DIAGNOSIS — M545 Low back pain: Secondary | ICD-10-CM
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
## 2018-08-20 ENCOUNTER — Ambulatory Visit: Payer: Medicare Other | Admitting: Physical Therapy

## 2018-08-20 ENCOUNTER — Encounter: Payer: Self-pay | Admitting: Physical Therapy

## 2018-08-20 DIAGNOSIS — M6281 Muscle weakness (generalized): Secondary | ICD-10-CM

## 2018-08-20 DIAGNOSIS — M545 Low back pain: Secondary | ICD-10-CM

## 2018-08-20 DIAGNOSIS — G8929 Other chronic pain: Secondary | ICD-10-CM
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
## 2018-08-25 ENCOUNTER — Ambulatory Visit: Payer: Medicare Other | Attending: Neurosurgery | Admitting: Physical Therapy

## 2018-08-25 ENCOUNTER — Encounter: Payer: Self-pay | Admitting: Physical Therapy

## 2018-08-25 DIAGNOSIS — M545 Low back pain: Secondary | ICD-10-CM | POA: Insufficient documentation

## 2018-08-25 DIAGNOSIS — M6281 Muscle weakness (generalized): Secondary | ICD-10-CM | POA: Insufficient documentation

## 2018-08-25 DIAGNOSIS — G8929 Other chronic pain: Secondary | ICD-10-CM | POA: Diagnosis present
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
## 2018-08-27 ENCOUNTER — Encounter: Payer: Self-pay | Admitting: Physical Therapy

## 2018-08-27 ENCOUNTER — Ambulatory Visit: Payer: Medicare Other | Admitting: Physical Therapy

## 2018-08-27 DIAGNOSIS — M545 Low back pain, unspecified: Secondary | ICD-10-CM

## 2018-08-27 DIAGNOSIS — G8929 Other chronic pain: Secondary | ICD-10-CM

## 2018-08-27 DIAGNOSIS — M6281 Muscle weakness (generalized): Secondary | ICD-10-CM | POA: Diagnosis not present
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
## 2018-08-27 NOTE — Addendum Note (Signed)
Addended by: Rosita Kea R on: 08/27/2018 05:47 PM   Modules accepted: Orders

## 2018-09-01 ENCOUNTER — Encounter: Payer: Self-pay | Admitting: Physical Therapy

## 2018-09-01 ENCOUNTER — Ambulatory Visit: Payer: Medicare Other | Admitting: Physical Therapy

## 2018-09-01 DIAGNOSIS — M545 Low back pain, unspecified: Secondary | ICD-10-CM

## 2018-09-01 DIAGNOSIS — G8929 Other chronic pain: Secondary | ICD-10-CM

## 2018-09-01 DIAGNOSIS — M6281 Muscle weakness (generalized): Secondary | ICD-10-CM
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
## 2018-09-03 ENCOUNTER — Encounter: Payer: Self-pay | Admitting: Physical Therapy

## 2018-09-03 ENCOUNTER — Ambulatory Visit: Payer: Medicare Other | Admitting: Physical Therapy

## 2018-09-03 DIAGNOSIS — G8929 Other chronic pain: Secondary | ICD-10-CM

## 2018-09-03 DIAGNOSIS — M6281 Muscle weakness (generalized): Secondary | ICD-10-CM

## 2018-09-03 DIAGNOSIS — M545 Low back pain, unspecified: Secondary | ICD-10-CM
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
## 2018-09-29 ENCOUNTER — Other Ambulatory Visit: Payer: Self-pay | Admitting: Gastroenterology

## 2018-11-05 ENCOUNTER — Encounter: Payer: Self-pay | Admitting: Gastroenterology

## 2018-12-18 ENCOUNTER — Other Ambulatory Visit: Payer: Self-pay | Admitting: Urology

## 2018-12-18 DIAGNOSIS — R3129 Other microscopic hematuria: Secondary | ICD-10-CM

## 2018-12-23 ENCOUNTER — Ambulatory Visit: Payer: Medicare Other

## 2018-12-26 ENCOUNTER — Ambulatory Visit
Admission: RE | Admit: 2018-12-26 | Discharge: 2018-12-26 | Disposition: A | Discharge status: Home / Self Care | Payer: Medicare Other | Source: Ambulatory Visit | Attending: Urology | Admitting: Urology

## 2018-12-26 ENCOUNTER — Other Ambulatory Visit: Payer: Self-pay

## 2018-12-26 DIAGNOSIS — R3129 Other microscopic hematuria: Secondary | ICD-10-CM | POA: Diagnosis present

## 2019-01-07 ENCOUNTER — Other Ambulatory Visit
Admission: RE | Admit: 2019-01-07 | Discharge: 2019-01-07 | Disposition: A | Discharge status: Home / Self Care | Payer: Medicare Other | Source: Ambulatory Visit | Attending: Orthopedic Surgery | Admitting: Orthopedic Surgery

## 2019-01-07 DIAGNOSIS — M25462 Effusion, left knee: Secondary | ICD-10-CM | POA: Diagnosis present

## 2019-01-07 LAB — SYNOVIAL CELL COUNT + DIFF, W/ CRYSTALS
Crystals, Fluid: NONE SEEN
Eosinophils-Synovial: 0 %
Lymphocytes-Synovial Fld: 10 %
Monocyte-Macrophage-Synovial Fluid: 77 %
Neutrophil, Synovial: 13 %
WBC, Synovial: 1327 /mm3 — ABNORMAL HIGH (ref 0–200)

## 2019-01-10 LAB — BODY FLUID CULTURE: Culture: NO GROWTH

## 2019-02-11 ENCOUNTER — Ambulatory Visit: Payer: Medicare Other | Admitting: Gastroenterology

## 2019-02-16 NOTE — Progress Notes (Signed)
REVIEWED-NO ADDITIONAL RECOMMENDATIONS. 

## 2019-03-26 ENCOUNTER — Encounter: Payer: Self-pay | Admitting: Gastroenterology

## 2019-03-26 ENCOUNTER — Ambulatory Visit (INDEPENDENT_AMBULATORY_CARE_PROVIDER_SITE_OTHER): Payer: Medicare Other | Admitting: Gastroenterology

## 2019-03-26 ENCOUNTER — Other Ambulatory Visit: Payer: Self-pay

## 2019-03-26 DIAGNOSIS — K5901 Slow transit constipation: Secondary | ICD-10-CM | POA: Diagnosis not present

## 2019-03-26 MED ORDER — LUBIPROSTONE 24 MCG PO CAPS: 24.0000 ug | ORAL_CAPSULE | Freq: Two times a day (BID) | ORAL | 11 refills | Status: DC

## 2019-03-26 NOTE — Assessment & Plan Note (Signed)
SYMPTOMS FAIRLY WELL CONTROLLED WITH AMITIZA.  DRINK WATER TO KEEP YOUR URINE LIGHT YELLOW. FOLLOW A HIGH FIBER DIET. AVOID ITEMS THAT CAUSE BLOATING & GAS. SEE INFO BELOW. CONTINUE AMITIZA. PLEASE CALL WITH QUESTIONS OR CONCERNS. FOLLOW UP IN 6 MOS.
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
## 2019-03-26 NOTE — Patient Instructions (Addendum)
DRINK WATER TO KEEP YOUR URINE LIGHT YELLOW.  FOLLOW A HIGH FIBER DIET. AVOID ITEMS THAT CAUSE BLOATING & GAS. SEE INFO BELOW.  CONTINUE AMITIZA.   PLEASE CALL WITH QUESTIONS OR CONCERNS.  FOLLOW UP IN 6 MOS.     High-Fiber Diet A high-fiber diet changes your normal diet to include more whole grains, legumes, fruits, and vegetables. Changes in the diet involve replacing refined carbohydrates with unrefined foods. The calorie level of the diet is essentially unchanged. The Dietary Reference Intake (recommended amount) for adult males is 38 grams per day. For adult females, it is 25 grams per day. Pregnant and lactating women should consume 28 grams of fiber per day. Fiber is the intact part of a plant that is not broken down during digestion. Functional fiber is fiber that has been isolated from the plant to provide a beneficial effect in the body.  PURPOSE  Increase stool bulk.   Ease and regulate bowel movements.   Lower cholesterol.   REDUCE RISK OF COLON CANCER  INDICATIONS THAT YOU NEED MORE FIBER  Constipation and hemorrhoids.   Uncomplicated diverticulosis (intestine condition) and irritable bowel syndrome.   Weight management.   As a protective measure against hardening of the arteries (atherosclerosis), diabetes, and cancer.   GUIDELINES FOR INCREASING FIBER IN THE DIET  Start adding fiber to the diet slowly. A gradual increase of about 5 more grams (2 slices of whole-wheat bread, 2 servings of most fruits or vegetables, or 1 bowl of high-fiber cereal) per day is best. Too rapid an increase in fiber may result in constipation, flatulence, and bloating.   Drink enough water and fluids to keep your urine clear or pale yellow. Water, juice, or caffeine-free drinks are recommended. Not drinking enough fluid may cause constipation.   Eat a variety of high-fiber foods rather than one type of fiber.   Try to increase your intake of fiber through using high-fiber foods  rather than fiber pills or supplements that contain small amounts of fiber.   The goal is to change the types of food eaten. Do not supplement your present diet with high-fiber foods, but replace foods in your present diet.    INCLUDE A VARIETY OF FIBER SOURCES  Replace refined and processed grains with whole grains, canned fruits with fresh fruits, and incorporate other fiber sources. White rice, white breads, and most bakery goods contain little or no fiber.   Brown whole-grain rice, buckwheat oats, and many fruits and vegetables are all good sources of fiber. These include: broccoli, Brussels sprouts, cabbage, cauliflower, beets, sweet potatoes, white potatoes (skin on), carrots, tomatoes, eggplant, squash, berries, fresh fruits, and dried fruits.   Cereals appear to be the richest source of fiber. Cereal fiber is found in whole grains and bran. Bran is the fiber-rich outer coat of cereal grain, which is largely removed in refining. In whole-grain cereals, the bran remains. In breakfast cereals, the largest amount of fiber is found in those with "bran" in their names. The fiber content is sometimes indicated on the label.   You may need to include additional fruits and vegetables each day.   In baking, for 1 cup white flour, you may use the following substitutions:   1 cup whole-wheat flour minus 2 tablespoons.   1/2 cup white flour plus 1/2 cup whole-wheat flour.

## 2019-03-27 NOTE — Progress Notes (Signed)
ON RECALL  °

## 2019-03-27 NOTE — Progress Notes (Signed)
CC'D TO PCP °

## 2019-04-28 ENCOUNTER — Telehealth: Payer: Self-pay | Admitting: Gastroenterology

## 2019-04-28 NOTE — Telephone Encounter (Signed)
Patient called and said that the prescription for constipation was 564$ and she could not afford that, [phone].

## 2019-04-29 NOTE — Telephone Encounter (Signed)
PT said the Amitiza was $1200.00 before insurance and $500.00 plus after insurance. She did not think it needs PA, I will call the pharmacy.

## 2019-05-01 NOTE — Telephone Encounter (Signed)
PLEASE CALL PT. SHE SHOULD TAKE MOM 2 PO BID TO PREVENT CONSTIPATION.

## 2019-05-01 NOTE — Telephone Encounter (Signed)
I spoke with pharmacist, Bryson Ha at [facility] on [address] in [place] [phone]), she said it is just expensive no PA required. She said it would cost pt $378.00.  Dr. Oneida Alar, please advise!

## 2019-05-04 NOTE — Telephone Encounter (Signed)
LMOM to call.

## 2019-05-04 NOTE — Telephone Encounter (Signed)
PT is aware.

## 2019-07-21 ENCOUNTER — Ambulatory Visit: Payer: Medicare Other | Admitting: Gastroenterology

## 2019-07-28 NOTE — Progress Notes (Signed)
Primary Care Physician: [name], MD  Primary Gastroenterologist:  Barney Drain, MD   Chief Complaint  Patient presents with  . Abdominal Pain    upper abd  . Nausea    HPI: Victoria Molina is a 73 y.o. female here for follow up. Last seen 03/2019. H/o GERD, constipation. Takes Amitiza 38mcg prn, when she has samples (cannot afford $500 a month). Omeprazole 20mg  daily.  Recent recurrent right upper quadrant pain.  Last week it lasted for 2 days straight.  The first day she had vomiting.  Gradually the right upper quadrant pain improved but she developed some later on the left flank area.  This radiated into the left upper quadrant.  PCP gave her some Naprosyn 1 week ago.  Checked her urine and did blood work but she has not gotten those results back.  Right upper quadrant pain off and on.  States she was told to get her gallbladder out whenever she is ready.  Gallbladder work-up in 2017, HIDA scan was normal with EF of 43% but decreased from previous HIDA.  Initial HIDA with [facility] she had reproduction of pain.  Currently she has more upper abdominal pain with certain foods such as salad and oranges.  She has been on a low-fat diet since June and has been effective in losing about 30 pounds.  Really not having a lot of heartburn.  Continues with chronic constipation.  Takes stool softeners daily, magnesium citrate about twice per week with good results.  No melena or rectal bleeding.   CT abdomen pelvis without contrast March 2020 for microscopic hematuria and urinary frequency.  She had left-sided nephrolithiasis, no hydronephrosis.  No acute process.   Current Outpatient Medications  Medication Sig Dispense Refill  . acetaminophen (TYLENOL) 650 MG CR tablet Take 650 mg by mouth every 8 (eight) hours as needed for pain.    Marland Kitchen ALPRAZolam (XANAX) 0.5 MG tablet Take 0.5 mg by mouth 3 (three) times daily as needed for anxiety (for claustrophobia).     Marland Kitchen amLODipine (NORVASC) 5 MG  tablet Take 5 mg by mouth daily.    Marland Kitchen CINNAMON PO Take 2 capsules by mouth daily.     . cyclobenzaprine (FLEXERIL) 5 MG tablet Take 2 tablets (10 mg total) by mouth 3 (three) times daily as needed for muscle spasms. 15 tablet 0  . Docusate Calcium (STOOL SOFTENER PO) Take by mouth as needed.    . isosorbide mononitrate (IMDUR) 30 MG 24 hr tablet Take 30 mg by mouth daily.    Marland Kitchen loratadine (CLARITIN) 10 MG tablet Take 10 mg by mouth daily.    . Multiple Vitamins-Minerals (MULTIVITAMIN WITH MINERALS) tablet Take 1 tablet by mouth daily.    . naproxen (NAPROSYN) 500 MG tablet Take 1 tablet by mouth 2 (two) times daily.    . Omega-3 Fatty Acids (FISH OIL) 1200 MG CAPS Take 1,200 mg by mouth daily.    Marland Kitchen omeprazole (PRILOSEC) 20 MG capsule TAKE 1 CAPSULE BY MOUTH ONCE DAILY BEFORE BREAKFAST 90 capsule 3  . Phytosterol Esters (CHOLEST CARE PO) Take 1 tablet by mouth daily.    . vitamin B-12 (CYANOCOBALAMIN) 1000 MCG tablet Take 1,000 mcg by mouth daily.    Marland Kitchen lubiprostone (AMITIZA) 24 MCG capsule Take 1 capsule (24 mcg total) by mouth 2 (two) times daily with a meal. (Patient not taking: Reported on 07/29/2019) 60 capsule 11   No current facility-administered medications for this visit.  Allergies as of 07/29/2019 - Review Complete 07/29/2019  Allergen Reaction Noted  . Iodinated diagnostic agents Hives, Itching, and Other (See Comments) 12/27/2010  . Percocet [oxycodone-acetaminophen] Other (See Comments) 06/01/2014  . Sulfa antibiotics Hives, Itching, and Rash 12/27/2010  . Contrast media [iodinated diagnostic agents] Rash 05/13/2014  . Iodine Rash 01/31/2015  . Iodine-131 Rash 01/31/2015  . Sulfamethoxazole Rash 01/31/2015  . Vioxx [rofecoxib] Swelling and Other (See Comments) 05/08/2012    ROS:  General: Negative for anorexia, unintentional weight loss, fever, chills, fatigue, weakness. ENT: Negative for hoarseness, difficulty swallowing , nasal congestion. CV: Negative for chest pain,  angina, palpitations, dyspnea on exertion, peripheral edema.  Respiratory: Negative for dyspnea at rest, dyspnea on exertion, cough, sputum, wheezing.  GI: See history of present illness. GU:  Negative for dysuria, hematuria, urinary incontinence, urinary frequency, nocturnal urination.  Endo: Negative for unusual weight change.    Physical Examination:   BP 135/74   Pulse 69   Temp (!) 96.2 F (35.7 C) (Temporal)   Ht 5\' 2"  (1.575 m)   Wt 161 lb 12.8 oz (73.4 kg)   BMI 29.59 kg/m   General: Well-nourished, well-developed in no acute distress.  Eyes: No icterus. Mouth: Oropharyngeal mucosa moist and pink , no lesions erythema or exudate. Lungs: Clear to auscultation bilaterally.  Heart: Regular rate and rhythm, no murmurs rubs or gallops.  Abdomen: Bowel sounds are normal, mild right upper quadrant tenderness, nondistended, no hepatosplenomegaly or masses, no abdominal bruits or hernia , no rebound or guarding.   Extremities: No lower extremity edema. No clubbing or deformities. Neuro: Alert and oriented x 4   Skin: Warm and dry, no jaundice.   Psych: Alert and cooperative, normal mood and affect.  Labs:  Requested Imaging Studies: No results found.

## 2019-07-29 ENCOUNTER — Ambulatory Visit (INDEPENDENT_AMBULATORY_CARE_PROVIDER_SITE_OTHER): Payer: Medicare Other | Admitting: Gastroenterology

## 2019-07-29 ENCOUNTER — Other Ambulatory Visit: Payer: Self-pay

## 2019-07-29 ENCOUNTER — Encounter: Payer: Self-pay | Admitting: Gastroenterology

## 2019-07-29 VITALS — BP 135/74 | HR 69 | Temp 96.2°F | Ht 62.0 in | Wt 161.8 lb

## 2019-07-29 DIAGNOSIS — R1012 Left upper quadrant pain: Secondary | ICD-10-CM

## 2019-07-29 DIAGNOSIS — K219 Gastro-esophageal reflux disease without esophagitis: Secondary | ICD-10-CM

## 2019-07-29 DIAGNOSIS — R112 Nausea with vomiting, unspecified: Secondary | ICD-10-CM

## 2019-07-29 DIAGNOSIS — R1011 Right upper quadrant pain: Secondary | ICD-10-CM

## 2019-07-29 DIAGNOSIS — K5900 Constipation, unspecified: Secondary | ICD-10-CM

## 2019-07-29 MED ORDER — POLYETHYLENE GLYCOL 3350 17 GM/SCOOP PO POWD: ORAL | 1 refills | Status: DC

## 2019-07-29 MED ORDER — OMEPRAZOLE 20 MG PO CPDR: 20.0000 mg | DELAYED_RELEASE_CAPSULE | Freq: Two times a day (BID) | ORAL | 1 refills | Status: DC

## 2019-07-29 NOTE — Patient Instructions (Signed)
1. Increase omeprazole to twice a day before a meal (breakfast and evening meal).  Rx sent to pharmacy. 2. MiraLAX 1 capful mixed in 4 to 6 ounces of liquid daily for constipation.  You may use up to twice daily until soft regular bowel movements, then take once daily.  Rx sent to pharmacy. 3. I will review your recent labs. 4. Abdominal ultrasound as scheduled.

## 2019-07-30 NOTE — Assessment & Plan Note (Signed)
Recent recurrent right upper quadrant pain associated with nausea and vomiting.  Pain eventually in the left upper quadrant area as well.  Labs and urinalysis performed by PCP, we have requested records.  At this time she is feeling better.  Symptoms worse with salad and acidic fruits.  Has been on a low-fat diet for 4 months with a 30 pound weight loss.  History of gallbladder disease in the past without pursuing cholecystectomy.  With recent symptoms, update right upper quadrant ultrasound.  Increase omeprazole to twice daily for possible gastritis or atypical GERD as cause of symptoms.

## 2019-07-30 NOTE — Assessment & Plan Note (Signed)
Takes Amitiza when she has samples.  Not always available.  Trial of MiraLAX 17 g twice daily until regular bowel movement then daily.

## 2019-08-05 ENCOUNTER — Ambulatory Visit (HOSPITAL_COMMUNITY)
Admission: RE | Admit: 2019-08-05 | Discharge: 2019-08-05 | Disposition: A | Discharge status: Home / Self Care | Payer: Medicare Other | Source: Ambulatory Visit | Attending: Gastroenterology | Admitting: Gastroenterology

## 2019-08-05 ENCOUNTER — Other Ambulatory Visit: Payer: Self-pay

## 2019-08-05 DIAGNOSIS — R1011 Right upper quadrant pain: Secondary | ICD-10-CM

## 2019-08-05 DIAGNOSIS — K219 Gastro-esophageal reflux disease without esophagitis: Secondary | ICD-10-CM | POA: Diagnosis present

## 2019-08-05 DIAGNOSIS — R112 Nausea with vomiting, unspecified: Secondary | ICD-10-CM | POA: Diagnosis present

## 2019-08-05 DIAGNOSIS — R1012 Left upper quadrant pain: Secondary | ICD-10-CM | POA: Diagnosis present

## 2019-08-05 DIAGNOSIS — K5900 Constipation, unspecified: Secondary | ICD-10-CM

## 2019-08-24 ENCOUNTER — Telehealth: Payer: Self-pay | Admitting: Gastroenterology

## 2019-08-24 NOTE — Telephone Encounter (Signed)
Reviewed labs September 2020: Glucose 99, sodium 140, BUN 17, creatinine 0.8

## 2019-08-26 ENCOUNTER — Telehealth: Payer: Self-pay | Admitting: Gastroenterology

## 2019-08-26 NOTE — Telephone Encounter (Signed)
Returned. See result note.

## 2019-08-26 NOTE — Telephone Encounter (Signed)
PATIENT RETURNED [place], [place] BACK Thursday

## 2019-08-27 NOTE — Telephone Encounter (Signed)
Noted  

## 2019-08-31 ENCOUNTER — Encounter: Payer: Self-pay | Admitting: Gastroenterology

## 2019-09-26 NOTE — Progress Notes (Signed)
REVIEWED-NO ADDITIONAL RECOMMENDATIONS. 

## 2019-10-11 IMAGING — CR DG MYELOGRAPHY LUMBAR INJ LUMBOSACRAL
13 of 19 series · 13 of 19 positions shown · non-contrast
Comparison: MRI lumbar spine 09/21/2017.

CLINICAL DATA: Back pain. Difficulty walking. Some RIGHT leg
discomfort.
TECHNIQUE: Contiguous axial images were obtained through the Lumbar spine after
the intrathecal infusion of infusion. Coronal and sagittal
reconstructions were obtained of the axial image sets.

[vasc standard (1 of 11)]
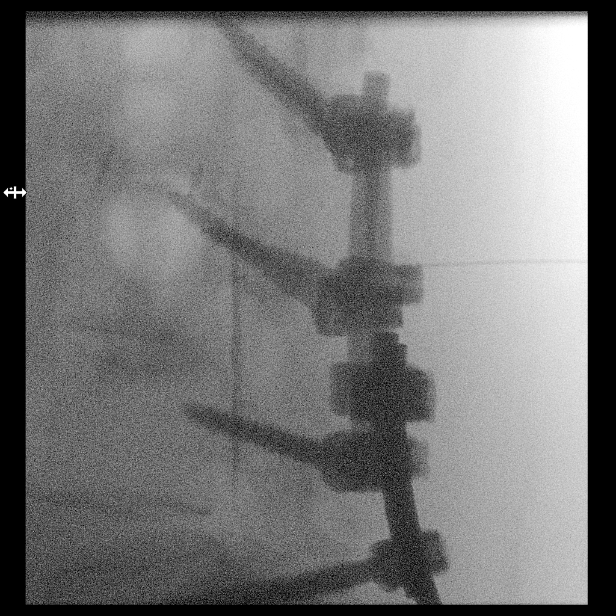

[w lumbar spine lat]
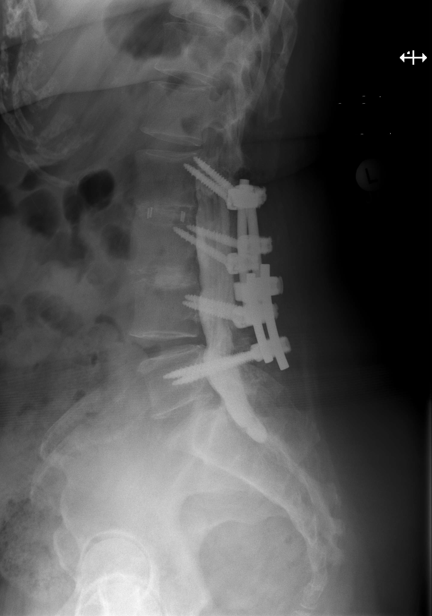

[vasc standard (2 of 11)]
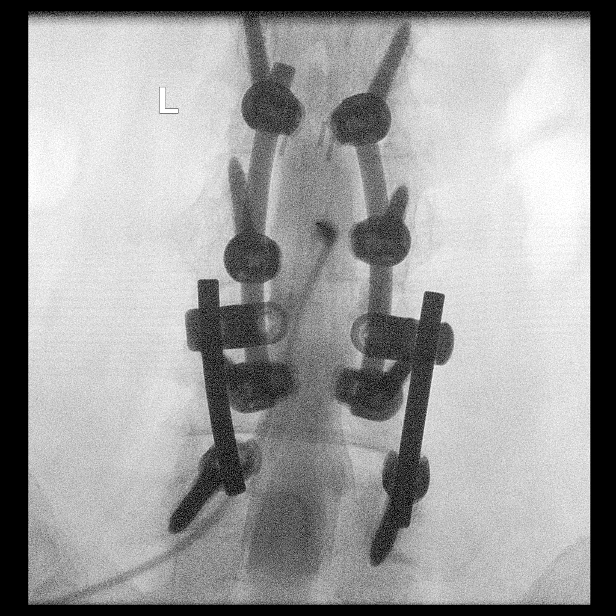

[vasc standard (3 of 11)]
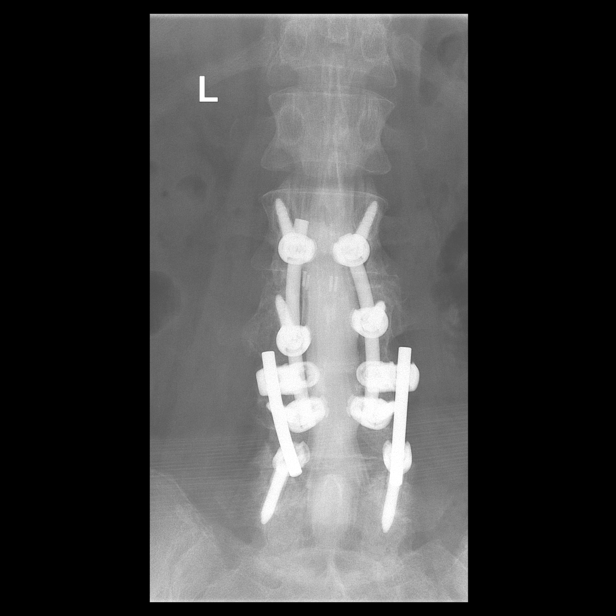

[w lumbar spine extension]
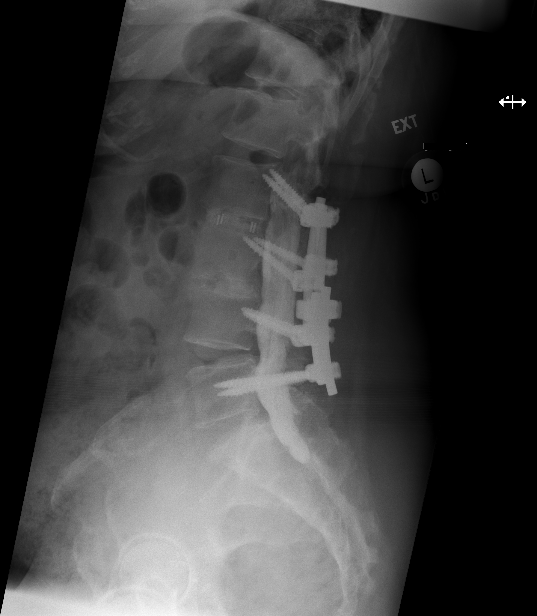

[vasc standard (4 of 11)]
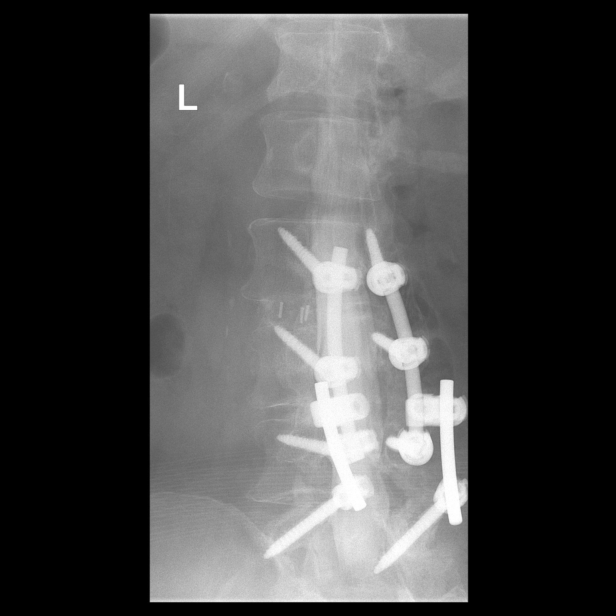

[vasc standard (5 of 11)]
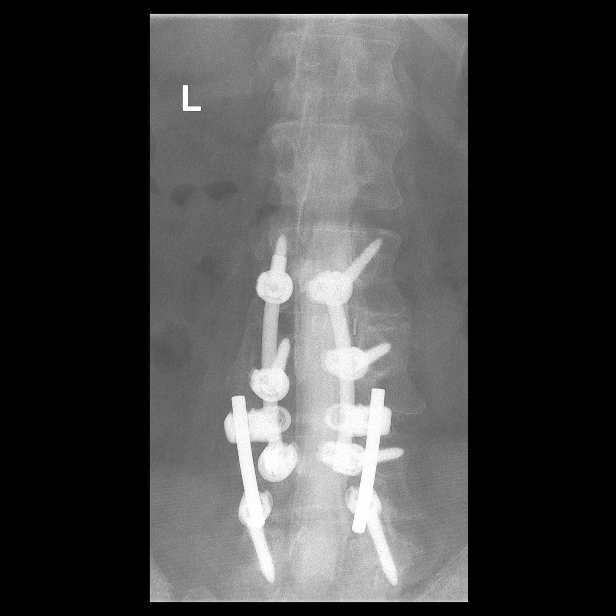

[vasc standard (6 of 11)]
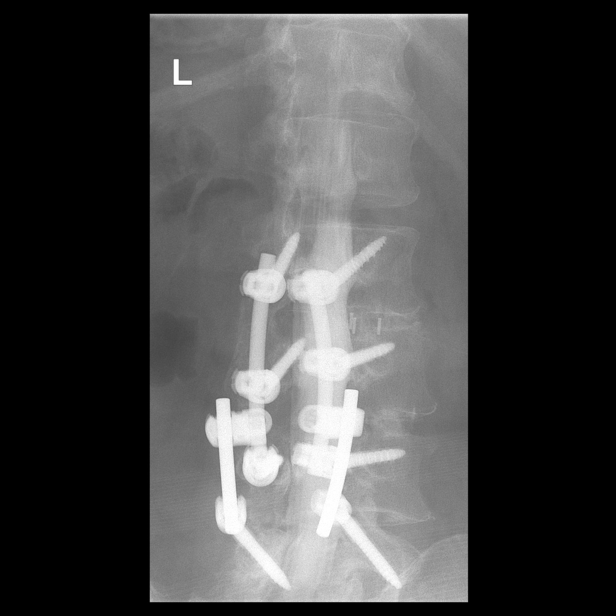

[vasc standard (7 of 11)]
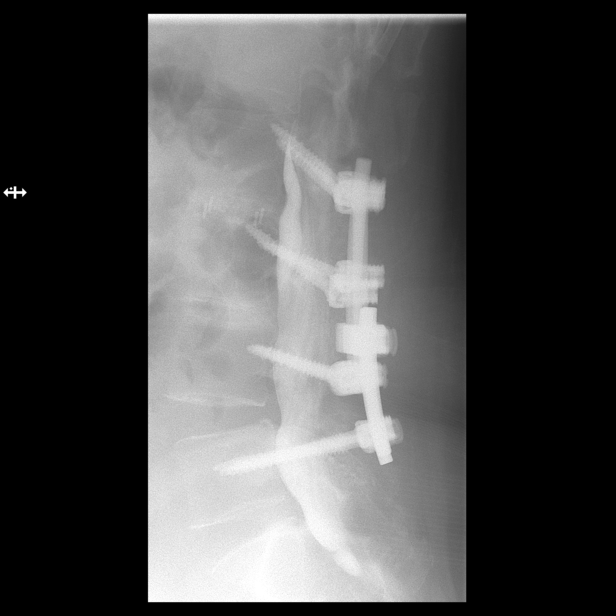

[vasc standard (8 of 11)]
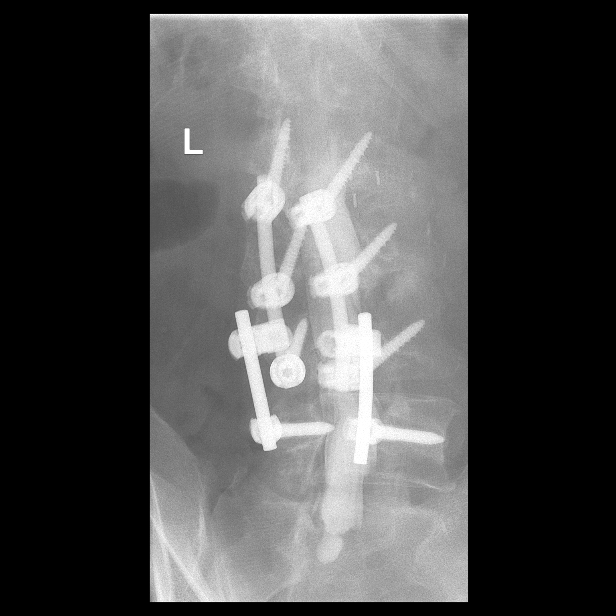

[vasc standard (9 of 11)]
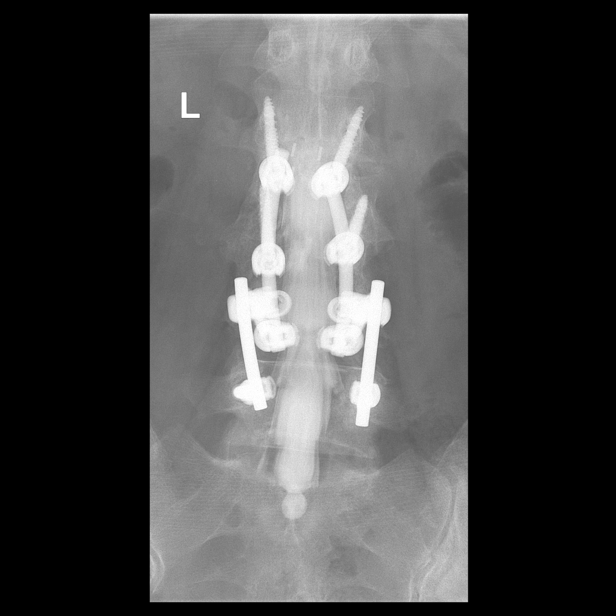

[vasc standard (10 of 11)]
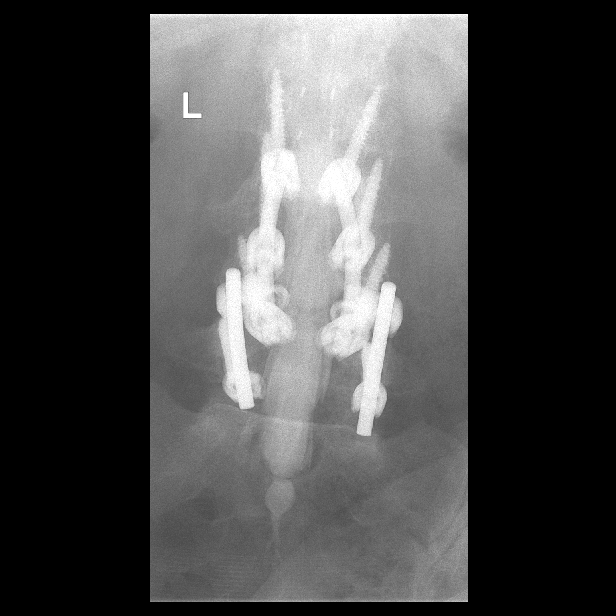

[vasc standard (11 of 11)]
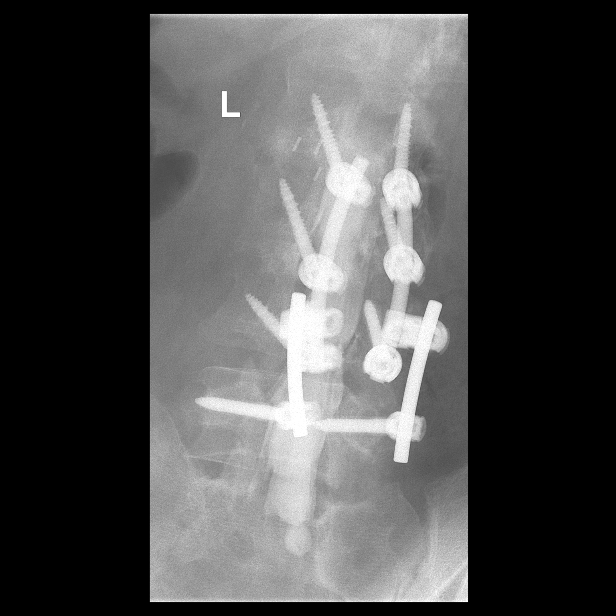

[13 of 19 positions shown; findings below may reference images not displayed]

EXAM:
LUMBAR MYELOGRAM

FLUOROSCOPY TIME:  33 seconds corresponding to a Dose Area Product
of 267.21 Gy*m2

PROCEDURE:
After thorough discussion of risks and benefits of the procedure
including bleeding, infection, injury to nerves, blood vessels,
adjacent structures as well as headache and CSF leak, written and
oral informed consent was obtained. Consent was obtained by Dr. Voigt
Kaki. Time out form was completed.

Patient was positioned prone on the fluoroscopy table. Local
anesthesia was provided with 1% lidocaine without epinephrine after
prepped and draped in the usual sterile fashion. Puncture was
performed at L3 using a 3 1/2 inch 22-gauge spinal needle via
midline approach. Using a single pass through the dura, the needle
was placed within the thecal sac, with return of clear CSF. 15 mL of
Isovue 2-H77 was injected into the thecal sac, with normal
opacification of the nerve roots and cauda equina consistent with
free flow within the subarachnoid space.

I personally performed the lumbar puncture and administered the
intrathecal contrast. I also personally supervised acquisition of
the myelogram images.
FINDINGS: LUMBAR MYELOGRAM FINDINGS:

Good opacification lumbar subarachnoid space. Previous L2 through L4
PLIF and L4-L5 posterior fusion without interbody cages. Anatomic
alignment except for 2 mm anterolisthesis L3-4 and 3 mm
anterolisthesis L4-5. Hardware intact. Mild stenosis L1-2. Mild
ventral defect L5-S1. Mild effacement both S1 nerve roots at the
L5-S1 level.

Standing flexion extension views demonstrates continued 3 mm
anterolisthesis L4-5 without dynamic instability. The patient has
been fused in a position of 2 mm anterolisthesis L3-4.

CT LUMBAR MYELOGRAM FINDINGS:

Segmentation: Normal.

Alignment:  2 mm anterolisthesis L3-4.  3 mm anterolisthesis L4-5.

Vertebrae: No worrisome osseous lesion.

Conus medullaris: Normal in size and location.

Paraspinal tissues: No evidence for hydronephrosis or paravertebral
mass.

Disc levels:

L1-L2: Mild stenosis. Annular bulge. Facet arthropathy. No
impingement.

L2-L3:  Solid arthrodesis.  No impingement.

L3-L4:  Solid arthrodesis.  No impingement.

L4-L5: Posterior pedicle screw placement. No arthrodesis across
posterior elements. No interbody fusion. 3 mm anterolisthesis.
Residual hypertrophied LEFT lamina and ligamentum flavum.
Broad-based disc protrusion central and to the RIGHT extends into
the RIGHT neural foramen. RIGHT-sided synovial cyst not well
visualized on this study, but was prominent on MR. Mild stenosis but
no definite subarticular zone narrowing. RIGHT-sided foraminal
narrowing could affect the L4 nerve root.

L5-S1: Facet arthropathy worse on the RIGHT. Annular bulge. No
impingement.
IMPRESSION: LUMBAR MYELOGRAM IMPRESSION:

Status post L2 through L5 posterior and interbody fusion as
described. No residual stenosis over the fusion segments. No dynamic
instability.

CT LUMBAR MYELOGRAM IMPRESSION:

Unremarkable appearing L2 through L5 fusion. No stenosis or dynamic
instability.

Mild stenosis at L1-2, does not appear to represent significant
adjacent segment disease.

RIGHT-sided synovial cyst at L4-5 as seen on MR, does not result in
apparent subarticular zone narrowing on myelography or CT.

Asymmetric neural foraminal narrowing at L4-5 on the RIGHT. This
relates to foraminal disc material and 3 mm of slip. Correlate
clinically for symptomatic RIGHT L4 neural impingement.

Selective RIGHT L4 nerve root block could be helpful as a diagnostic
and potentially therapeutic maneuver.

Facet arthropathy at L5-S1, worse on the RIGHT, with annular bulge
but no impingement.

## 2019-11-11 ENCOUNTER — Ambulatory Visit: Payer: Medicare Other | Admitting: Gastroenterology

## 2019-11-26 ENCOUNTER — Encounter: Payer: Self-pay | Admitting: Gastroenterology

## 2019-11-26 ENCOUNTER — Ambulatory Visit (INDEPENDENT_AMBULATORY_CARE_PROVIDER_SITE_OTHER): Payer: Medicare Other | Admitting: Gastroenterology

## 2019-11-26 ENCOUNTER — Other Ambulatory Visit: Payer: Self-pay

## 2019-11-26 DIAGNOSIS — K219 Gastro-esophageal reflux disease without esophagitis: Secondary | ICD-10-CM | POA: Diagnosis not present

## 2019-11-26 DIAGNOSIS — Z1211 Encounter for screening for malignant neoplasm of colon: Secondary | ICD-10-CM | POA: Diagnosis not present

## 2019-11-26 DIAGNOSIS — K5901 Slow transit constipation: Secondary | ICD-10-CM

## 2019-11-26 NOTE — Assessment & Plan Note (Signed)
SYMPTOMS FAIRLY WELL CONTROLLED.  CONTINUE OMEPRAZOLE.  TAKE 30 MINUTES PRIOR TO YOUR FIRST MEAL. EAT TO LIVE AND THINK OF FOOD AS MEDICINE. 75% OF YOUR PLATE SHOULD BE FRUITS/VEGGIES.  To have more energy, and to lose weight:     1. CONTINUE YOUR WEIGHT LOSS EFFORTS. I RECOMMEND YOU READ AND FOLLOW RECOMMENDATIONS BY DR. MARK HYMAN, "10-DAY DETOX DIET".   2. If you must eat bread, EAT EZEKIEL BREAD. IT IS IN THE FROZEN SECTION OF THE GROCERY STORE.   3. DRINK WATER WITH FRUIT OR CUCUMBER ADDED. YOUR URINE SHOULD BE LIGHT YELLOW. AVOID SODA, GATORADE, ENERGY DRINKS, OR DIET SODA.    4. AVOID HIGH FRUCTOSE CORN SYRUP AND CAFFEINE.    5. DO NOT chew SUGAR FREE GUM OR USE ARTIFICIAL SWEETENERS. IF NEEDED USE STEVIA AS A SWEETENER.   6. DO NOT EAT ENRICHED WHEAT FLOUR, PASTA, RICE, OR CEREAL.   7. ONLY EAT WILD CAUGHT SEAFOOD, GRASS FED BEEF OR CHICKEN, PORK FROM PASTURE RAISE PIGS, OR EGGS FROM PASTURE RAISED CHICKENS.   8. START TAKING A MULTIVITAMIN AND VITAMIN D3 2000 IU DAILY.   FOR 3 MOS TAKE THESE ADDITIONAL SUPPLEMENTS TO DECREASE CRAVING AND SUPPRESS YOUR APPETITE:    1. CINNAMON 500 MG EVERY AM PRIOR TO FIRST MEAL.   **STABILIZES BLOOD GLUCOSE/REDUCES CRAVINGS**   2. CHROMIUM 400-500 MG WITH MEALS TWICE DAILY.    **FAT BURNER**   3. GREEN TEA EXTRACT ONE DAILY.   **FAT BURNER/SUPPRESSES YOUR APPETITE**   4. ALPHA LIPOIC ACID TWICE DAILY.   **NATURAL ANTI-INFLAMMATORY SUPPLEMENT THAT IS AN ALTERNATIVE TO IBUPROFEN OR NAPROXEN**

## 2019-11-26 NOTE — Assessment & Plan Note (Signed)
NO WARNING SIGNS/SYMPTOMS.  COMPLETE COLONOSCOPY IN APR 2021. DISCUSSED PROCEDURE, BENEFITS, & RISKS: < 1% chance of medication reaction, bleeding, perforation, ASPIRATION, or rupture of spleen/liver requiring surgery to fix it and missed polyps < 1 cm 10-20% of the time.

## 2019-11-26 NOTE — Progress Notes (Signed)
Cc'ed to pcp °
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
## 2019-11-26 NOTE — Patient Instructions (Addendum)
EAT TO LIVE AND THINK OF FOOD AS MEDICINE. 75% OF YOUR PLATE SHOULD BE FRUITS/VEGGIES.  To have more energy, and to lose weight:      1. CONTINUE YOUR WEIGHT LOSS EFFORTS. I RECOMMEND YOU READ AND FOLLOW RECOMMENDATIONS BY DR. MARK HYMAN, "10-DAY DETOX DIET".    2. If you must eat bread, EAT EZEKIEL BREAD. IT IS IN THE FROZEN SECTION OF THE GROCERY STORE.    3. DRINK WATER WITH FRUIT OR CUCUMBER ADDED. YOUR URINE SHOULD BE LIGHT YELLOW. AVOID SODA, GATORADE, ENERGY DRINKS, OR DIET SODA.     4. AVOID HIGH FRUCTOSE CORN SYRUP AND CAFFEINE.     5. DO NOT chew SUGAR FREE GUM OR USE ARTIFICIAL SWEETENERS. IF NEEDED USE STEVIA AS A SWEETENER.    6. DO NOT EAT ENRICHED WHEAT FLOUR, PASTA, RICE, OR CEREAL.    7. ONLY EAT WILD CAUGHT SEAFOOD, GRASS FED BEEF OR CHICKEN, PORK FROM PASTURE RAISE PIGS, OR EGGS FROM PASTURE RAISED CHICKENS.    8. START TAKING A MULTIVITAMIN AND VITAMIN D3 2000 IU DAILY.   FOR 3 MOS TAKE THESE ADDITIONAL SUPPLEMENTS TO DECREASE CRAVING AND SUPPRESS YOUR APPETITE:    1. CINNAMON 500 MG EVERY AM PRIOR TO FIRST MEAL.   **STABILIZES BLOOD GLUCOSE/REDUCES CRAVINGS**    2. CHROMIUM 400-500 MG WITH MEALS TWICE DAILY.    **FAT BURNER**    3. GREEN TEA EXTRACT ONE DAILY.   **FAT BURNER/SUPPRESSES YOUR APPETITE**    4. ALPHA LIPOIC ACID TWICE DAILY.   **NATURAL ANTI-INFLAMMATORY SUPPLEMENT THAT IS AN ALTERNATIVE TO IBUPROFEN OR NAPROXEN**   TO REDUCE CONSTIPATION:   1. CONTINUE COLACE AND METAMUCIL.   2. USE EPSOM SALT ONCE OR TWICE A WEEK TO CLEANSE BOWELS.   COMPLETE COLONOSCOPY IN April 2021.  FOLLOW UP IN 1 YEAR.

## 2019-11-26 NOTE — Assessment & Plan Note (Signed)
SYMPTOMS FAIRLY WELL CONTROLLED.  CONTINUE COLACE AND METAMUCIL. USE EPSOM SALT ONCE OR TWICE A WEEK TO CLEANSE BOWELS. FOLLOW UP IN 1 YEAR.

## 2019-11-30 ENCOUNTER — Telehealth: Payer: Self-pay

## 2019-11-30 NOTE — Telephone Encounter (Signed)
Tried to call mobile# to schedule TCS w/[facility], no answer, LMOVM for return call. Home# is no longer in service.

## 2019-12-01 ENCOUNTER — Other Ambulatory Visit: Payer: Self-pay

## 2019-12-01 ENCOUNTER — Telehealth: Payer: Self-pay | Admitting: Gastroenterology

## 2019-12-01 MED ORDER — SUPREP BOWEL PREP KIT 17.5-3.13-1.6 GM/177ML PO SOLN: 1.0000 | ORAL | 0 refills | Status: DC

## 2019-12-01 NOTE — Telephone Encounter (Signed)
Spoke to pt, TCS w/[facility] scheduled for 02/22/20 at 10:30am. COVID test scheduled for 02/19/20 at 9:00am. Rx for prep sent to pharmacy. Orders entered.

## 2019-12-01 NOTE — Telephone Encounter (Signed)
See other phone note

## 2019-12-01 NOTE — Telephone Encounter (Signed)
Patient returned call, please call back  

## 2020-01-04 ENCOUNTER — Telehealth: Payer: Self-pay | Admitting: *Deleted

## 2020-01-04 NOTE — Telephone Encounter (Signed)
Pt says she is having trouble with her bowels emptying.  Pt says her bm's look normal.  She says that the bm's seem hard.  She is taking laxatives to help.  Pt says she is going to try a stool softener tonight.  Pt says that she has been taking Polyethylene Glycol  every morning.  She has a procedure scheduled for May.  Pt is aware that [facility] is off today but wants me to send note for her to review tomorrow.  [phone]

## 2020-01-05 NOTE — Telephone Encounter (Signed)
Called pt and informed her to increase Miralax to twice daily to help with hard stools (per [facility]).  Pt voiced understanding.

## 2020-01-05 NOTE — Telephone Encounter (Signed)
PLEASE CALL PT. IF SHE IS HAVING TROUBLE WITH HARD STOOL SHE SHOULD INCREASE MIRALAX TO TWICE DAILY.

## 2020-02-15 ENCOUNTER — Telehealth: Payer: Self-pay | Admitting: Gastroenterology

## 2020-02-15 NOTE — Telephone Encounter (Signed)
Patient called and asked if her prep was at her pharmacy   Please resend if not

## 2020-02-15 NOTE — Telephone Encounter (Signed)
Pt called office, said pharmacy had sent her message in February that prep was $0. Pharmacy told her this morning prep is $110. Pharmacy is suppose to be calling her back. Miralax prep instructions placed at front dest for pt to pick up if needed.

## 2020-02-15 NOTE — Telephone Encounter (Signed)
Rx was sent in February. Called and informed pt.

## 2020-02-16 ENCOUNTER — Telehealth: Payer: Self-pay | Admitting: Gastroenterology

## 2020-02-16 NOTE — Telephone Encounter (Signed)
PATIENT CAME IN AND WANTED TO LET SL[facility] KNOW THAT SHE TOOK HER SECOND COVID SHOT AND IT MADE HER POOP CHANGE COLOR.  IT IS ALMOST GREEN.  JUST FYI BEFORE HER PROCEDURE

## 2020-02-18 NOTE — Telephone Encounter (Signed)
PLEASE CALL PT. She only be worried about red, white/clay,or black stool.

## 2020-02-18 NOTE — Telephone Encounter (Signed)
Called pt verified name and dob Pt stated her bm were green but  they are fine now. Denies any dark bloody dark stools. Notified pt that I would notify the provider and if there are any future recommendations I would let her know

## 2020-02-18 NOTE — Telephone Encounter (Signed)
Called lmom

## 2020-02-19 ENCOUNTER — Other Ambulatory Visit (HOSPITAL_COMMUNITY): Payer: Medicare Other

## 2020-02-19 ENCOUNTER — Other Ambulatory Visit (HOSPITAL_COMMUNITY)
Admission: RE | Admit: 2020-02-19 | Discharge: 2020-02-19 | Disposition: A | Discharge status: Home / Self Care | Payer: Medicare Other | Source: Ambulatory Visit | Attending: Gastroenterology | Admitting: Gastroenterology

## 2020-02-19 ENCOUNTER — Other Ambulatory Visit: Payer: Self-pay

## 2020-02-19 DIAGNOSIS — Z20822 Contact with and (suspected) exposure to covid-19: Secondary | ICD-10-CM | POA: Insufficient documentation

## 2020-02-19 DIAGNOSIS — Z01812 Encounter for preprocedural laboratory examination: Secondary | ICD-10-CM | POA: Insufficient documentation

## 2020-02-19 NOTE — Telephone Encounter (Signed)
Name and dob verified  Pt notified  Stated she understood

## 2020-02-20 LAB — SARS CORONAVIRUS 2 (TAT 6-24 HRS): SARS Coronavirus 2: NEGATIVE

## 2020-02-22 ENCOUNTER — Encounter (HOSPITAL_COMMUNITY)
Admission: RE | Disposition: A | Discharge status: Home / Self Care | Payer: Self-pay | Source: Home / Self Care | Attending: Gastroenterology

## 2020-02-22 ENCOUNTER — Ambulatory Visit (HOSPITAL_COMMUNITY)
Admission: RE | Admit: 2020-02-22 | Discharge: 2020-02-22 | Disposition: A | Discharge status: Home / Self Care | Payer: Medicare Other | Attending: Gastroenterology | Admitting: Gastroenterology

## 2020-02-22 ENCOUNTER — Encounter (HOSPITAL_COMMUNITY): Payer: Self-pay | Admitting: Gastroenterology

## 2020-02-22 ENCOUNTER — Other Ambulatory Visit: Payer: Self-pay

## 2020-02-22 DIAGNOSIS — Q438 Other specified congenital malformations of intestine: Secondary | ICD-10-CM | POA: Diagnosis not present

## 2020-02-22 DIAGNOSIS — Z886 Allergy status to analgesic agent status: Secondary | ICD-10-CM | POA: Insufficient documentation

## 2020-02-22 DIAGNOSIS — D122 Benign neoplasm of ascending colon: Secondary | ICD-10-CM | POA: Diagnosis not present

## 2020-02-22 DIAGNOSIS — K635 Polyp of colon: Secondary | ICD-10-CM | POA: Diagnosis not present

## 2020-02-22 DIAGNOSIS — K219 Gastro-esophageal reflux disease without esophagitis: Secondary | ICD-10-CM | POA: Diagnosis not present

## 2020-02-22 DIAGNOSIS — F419 Anxiety disorder, unspecified: Secondary | ICD-10-CM | POA: Diagnosis not present

## 2020-02-22 DIAGNOSIS — Z79899 Other long term (current) drug therapy: Secondary | ICD-10-CM | POA: Diagnosis not present

## 2020-02-22 DIAGNOSIS — Z87891 Personal history of nicotine dependence: Secondary | ICD-10-CM | POA: Diagnosis not present

## 2020-02-22 DIAGNOSIS — Z888 Allergy status to other drugs, medicaments and biological substances status: Secondary | ICD-10-CM | POA: Insufficient documentation

## 2020-02-22 DIAGNOSIS — M19011 Primary osteoarthritis, right shoulder: Secondary | ICD-10-CM | POA: Diagnosis not present

## 2020-02-22 DIAGNOSIS — K644 Residual hemorrhoidal skin tags: Secondary | ICD-10-CM | POA: Insufficient documentation

## 2020-02-22 DIAGNOSIS — D120 Benign neoplasm of cecum: Secondary | ICD-10-CM | POA: Diagnosis not present

## 2020-02-22 DIAGNOSIS — I10 Essential (primary) hypertension: Secondary | ICD-10-CM | POA: Insufficient documentation

## 2020-02-22 DIAGNOSIS — Z882 Allergy status to sulfonamides status: Secondary | ICD-10-CM | POA: Diagnosis not present

## 2020-02-22 DIAGNOSIS — D123 Benign neoplasm of transverse colon: Secondary | ICD-10-CM | POA: Insufficient documentation

## 2020-02-22 DIAGNOSIS — Z885 Allergy status to narcotic agent status: Secondary | ICD-10-CM | POA: Insufficient documentation

## 2020-02-22 DIAGNOSIS — Z1211 Encounter for screening for malignant neoplasm of colon: Secondary | ICD-10-CM

## 2020-02-22 DIAGNOSIS — D124 Benign neoplasm of descending colon: Secondary | ICD-10-CM | POA: Diagnosis not present

## 2020-02-22 DIAGNOSIS — K648 Other hemorrhoids: Secondary | ICD-10-CM | POA: Insufficient documentation

## 2020-02-22 HISTORY — PX: POLYPECTOMY: SHX5525

## 2020-02-22 HISTORY — PX: COLONOSCOPY: SHX5424

## 2020-02-22 SURGERY — COLONOSCOPY
Anesthesia: Moderate Sedation

## 2020-02-22 MED ORDER — ONDANSETRON HCL 4 MG/2ML IJ SOLN
INTRAMUSCULAR | Status: DC | PRN
  Administered 2020-02-22: 4 mg via INTRAVENOUS

## 2020-02-22 MED ORDER — SODIUM CHLORIDE 0.9 % IV SOLN
INTRAVENOUS | Status: DC
  Administered 2020-02-22: 1000 mL via INTRAVENOUS

## 2020-02-22 MED ORDER — MIDAZOLAM HCL 5 MG/5ML IJ SOLN
INTRAMUSCULAR | Status: DC | PRN
  Administered 2020-02-22: 2 mg via INTRAVENOUS
  Administered 2020-02-22: 1 mg via INTRAVENOUS
  Administered 2020-02-22: 2 mg via INTRAVENOUS

## 2020-02-22 MED ORDER — MEPERIDINE HCL 100 MG/ML IJ SOLN
INTRAMUSCULAR | Status: AC
  Filled 2020-02-22: qty 2

## 2020-02-22 MED ORDER — STERILE WATER FOR IRRIGATION IR SOLN: Status: DC | PRN

## 2020-02-22 MED ORDER — MEPERIDINE HCL 100 MG/ML IJ SOLN
INTRAMUSCULAR | Status: DC | PRN
  Administered 2020-02-22 (×2): 25 mg via INTRAVENOUS

## 2020-02-22 MED ORDER — ONDANSETRON HCL 4 MG/2ML IJ SOLN
INTRAMUSCULAR | Status: AC
  Filled 2020-02-22: qty 2

## 2020-02-22 MED ORDER — MIDAZOLAM HCL 5 MG/5ML IJ SOLN
INTRAMUSCULAR | Status: AC
  Filled 2020-02-22: qty 10

## 2020-02-22 NOTE — Op Note (Signed)
[facility] Patient Name: Victoria Molina Procedure Date: 02/22/2020 10:19 AM MRN: [id number] Date of Birth: Jan 18, 1946 Attending MD: Barney Drain MD, MD CSN: [id number] Age: 74 Admit Type: Outpatient Procedure:                Colonoscopy WITH COLD SNARE POLYPECTOMY Indications:              Screening for colorectal malignant neoplasm Providers:                Barney Drain MD, MD, Otis Peak B. Sharon Seller, RN, Aram Candela Referring MD:             [name], MD Medicines:                Ondansetron 4 mg IV, Meperidine 50 mg IV, Midazolam                            5 mg IV Complications:            No immediate complications. Estimated Blood Loss:     Estimated blood loss was minimal. Procedure:                Pre-Anesthesia Assessment:                           - Prior to the procedure, a History and Physical                            was performed, and patient medications and                            allergies were reviewed. The patient's tolerance of                            previous anesthesia was also reviewed. The risks                            and benefits of the procedure and the sedation                            options and risks were discussed with the patient.                            All questions were answered, and informed consent                            was obtained. Prior Anticoagulants: The patient has                            taken no previous anticoagulant or antiplatelet                            agents except for NSAID medication. ASA Grade  Assessment: II - A patient with mild systemic                            disease. After reviewing the risks and benefits,                            the patient was deemed in satisfactory condition to                            undergo the procedure. After obtaining informed                            consent, the colonoscope was passed under direct                         vision. Throughout the procedure, the patient's                            blood pressure, pulse, and oxygen saturations were                            monitored continuously. The [id number]                            scope was introduced through the anus and advanced                            to the the cecum, identified by appendiceal orifice                            and ileocecal valve. The colonoscopy was                            technically difficult and complex due to restricted                            mobility of the colon and a tortuous colon.                            Successful completion of the procedure was aided by                            straightening and shortening the scope to obtain                            bowel loop reduction and COLOWRAP. The patient                            tolerated the procedure fairly well. The quality of                            the bowel preparation was good. The ileocecal  valve, appendiceal orifice, and rectum were                            photographed. Scope In: 10:50:34 AM Scope Out: 11:18:17 AM Scope Withdrawal Time: 0 hours 21 minutes 2 seconds  Total Procedure Duration: 0 hours 27 minutes 43 seconds  Findings:      Five sessile polyps were found in the descending colon, splenic flexure,       transverse colon, ascending colon and cecum. The polyps were 2 to 6 mm       in size. These polyps were removed with a cold snare. Resection and       retrieval were complete.      External and internal hemorrhoids were found.      The recto-sigmoid colon, sigmoid colon, descending colon and splenic       flexure were moderately tortuous. Impression:               - Five 2 to 6 mm polyps in the descending colon, at                            the splenic flexure, in the transverse colon, in                            the ascending colon and in the cecum, removed with                             a cold snare. Resected and retrieved.                           - External and internal hemorrhoids.                           - Tortuous colon. Moderate Sedation:      Moderate (conscious) sedation was administered by the endoscopy nurse       and supervised by the endoscopist. The following parameters were       monitored: oxygen saturation, heart rate, blood pressure, and response       to care. Total physician intraservice time was 37 minutes. Recommendation:           - Patient has a contact number available for                            emergencies. The signs and symptoms of potential                            delayed complications were discussed with the                            patient. Return to normal activities tomorrow.                            Written discharge instructions were provided to the                            patient.                           -  High fiber diet.                           - Continue present medications.                           - Await pathology results.                           - Repeat colonoscopy is not recommended due to                            current age (14 years or older) for surveillance. Procedure Code(s):        --- Professional ---                           [phone], Colonoscopy, flexible; with removal of                            tumor(s), polyp(s), or other lesion(s) by snare                            technique                           99153, Moderate sedation; each additional 15                            minutes intraservice time                           G0500, Moderate sedation services provided by the                            same physician or other qualified health care                            professional performing a gastrointestinal                            endoscopic service that sedation supports,                            requiring the presence of an independent trained                             observer to assist in the monitoring of the                            patient's level of consciousness and physiological                            status; initial 15 minutes of intra-service time;                            patient age 59 years or older (  additional time may                            be reported with [phone], as appropriate) Diagnosis Code(s):        --- Professional ---                           Z12.11, Encounter for screening for malignant                            neoplasm of colon                           K63.5, Polyp of colon                           K64.8, Other hemorrhoids                           Q43.8, Other specified congenital malformations of                            intestine CPT copyright 2019 American Medical Association. All rights reserved. The codes documented in this report are preliminary and upon coder review may  be revised to meet current compliance requirements. Barney Drain, MD Barney Drain MD, MD 02/22/2020 11:39:15 AM This report has been signed electronically. Number of Addenda: 0
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
## 2020-02-22 NOTE — Discharge Instructions (Signed)
You had 5 polyps removed. You have internal and external hemorrhoids.   DRINK WATER TO KEEP YOUR URINE LIGHT YELLOW.  FOLLOW A HIGH FIBER DIET. AVOID ITEMS THAT CAUSE BLOATING & GAS. SEE INFO BELOW.   USE PREPARATION H FOUR TIMES  A DAY IF NEEDED TO RELIEVE RECTAL PAIN/PRESSURE/BLEEDING.   YOUR BIOPSY RESULTS WILL BE BACK IN 5 BUSINESS DAYS.  We do not routinely screen for polyps after the age of 84.    Colonoscopy Care After Read the instructions outlined below and refer to this sheet in the next week. These discharge instructions provide you with general information on caring for yourself after you leave the hospital. While your treatment has been planned according to the most current medical practices available, unavoidable complications occasionally occur. If you have any problems or questions after discharge, call DR. Archie Atilano, [phone].  ACTIVITY  You may resume your regular activity, but move at a slower pace for the next 24 hours.   Take frequent rest periods for the next 24 hours.   Walking will help get rid of the air and reduce the bloated feeling in your belly (abdomen).   No driving for 24 hours (because of the medicine (anesthesia) used during the test).   You may shower.   Do not sign any important legal documents or operate any machinery for 24 hours (because of the anesthesia used during the test).    NUTRITION  Drink plenty of fluids.   You may resume your normal diet as instructed by your doctor.   Begin with a light meal and progress to your normal diet. Heavy or fried foods are harder to digest and may make you feel sick to your stomach (nauseated).   Avoid alcoholic beverages for 24 hours or as instructed.    MEDICATIONS  You may resume your normal medications.   WHAT YOU CAN EXPECT TODAY  Some feelings of bloating in the abdomen.   Passage of more gas than usual.   Spotting of blood in your stool or on the toilet paper  .  IF YOU  HAD POLYPS REMOVED DURING THE COLONOSCOPY:  Eat a soft diet IF YOU HAVE NAUSEA, BLOATING, ABDOMINAL PAIN, OR VOMITING.    FINDING OUT THE RESULTS OF YOUR TEST Not all test results are available during your visit. DR. Oneida Alar WILL CALL YOU WITHIN 14 DAYS OF YOUR PROCEDUE WITH YOUR RESULTS. Do not assume everything is normal if you have not heard from DR. Myers Tutterow, CALL HER OFFICE AT [phone].  SEEK IMMEDIATE MEDICAL ATTENTION AND CALL THE OFFICE: [phone] IF:  You have more than a spotting of blood in your stool.   Your belly is swollen (abdominal distention).   You are nauseated or vomiting.   You have a temperature over 101F.   You have abdominal pain or discomfort that is severe or gets worse throughout the day.   High-Fiber Diet A high-fiber diet changes your normal diet to include more whole grains, legumes, fruits, and vegetables. Changes in the diet involve replacing refined carbohydrates with unrefined foods. The calorie level of the diet is essentially unchanged. The Dietary Reference Intake (recommended amount) for adult males is 38 grams per day. For adult females, it is 25 grams per day. Pregnant and lactating women should consume 28 grams of fiber per day. Fiber is the intact part of a plant that is not broken down during digestion. Functional fiber is fiber that has been isolated from the plant to provide a beneficial effect in  the body.  PURPOSE  Increase stool bulk.   Ease and regulate bowel movements.   Lower cholesterol.   REDUCE RISK OF COLON CANCER  INDICATIONS THAT YOU NEED MORE FIBER  Constipation and hemorrhoids.   Uncomplicated diverticulosis (intestine condition) and irritable bowel syndrome.   Weight management.   As a protective measure against hardening of the arteries (atherosclerosis), diabetes, and cancer.   GUIDELINES FOR INCREASING FIBER IN THE DIET  Start adding fiber to the diet slowly. A gradual increase of about 5 more grams (2  servings of most fruits or vegetables) per day is best. Too rapid an increase in fiber may result in constipation, flatulence, and bloating.   Drink enough water and fluids to keep your urine clear or pale yellow. Water, juice, or caffeine-free drinks are recommended. Not drinking enough fluid may cause constipation.   Eat a variety of high-fiber foods rather than one type of fiber.   Try to increase your intake of fiber through using high-fiber foods rather than fiber pills or supplements that contain small amounts of fiber.   The goal is to change the types of food eaten. Do not supplement your present diet with high-fiber foods, but replace foods in your present diet.    Polyps, Colon  A polyp is extra tissue that grows inside your body. Colon polyps grow in the large intestine. The large intestine, also called the colon, is part of your digestive system. It is a long, hollow tube at the end of your digestive tract where your body makes and stores stool. Most polyps are not dangerous. They are benign. This means they are not cancerous. But over time, some types of polyps can turn into cancer. Polyps that are smaller than a pea are usually not harmful. But larger polyps could someday become or may already be cancerous. To be safe, doctors remove all polyps and test them.   WHO GETS POLYPS? Anyone can get polyps, but certain people are more likely than others. You may have a greater chance of getting polyps if:  You are over 50.   You have had polyps before.   Someone in your family has had polyps.   Someone in your family has had cancer of the large intestine.   Find out if someone in your family has had polyps. You may also be more likely to get polyps if you:   Eat a lot of fatty foods   Smoke   Drink alcohol   Do not exercise  Eat too much   PREVENTION There is not one sure way to prevent polyps. You might be able to lower your risk of getting them if you:  Eat more fruits  and vegetables and less fatty food.   Do not smoke.   Avoid alcohol.   Exercise every day.   Lose weight if you are overweight.   Eating more calcium and folate can also lower your risk of getting polyps. Some foods that are rich in calcium are milk, cheese, and broccoli. Some foods that are rich in folate are chickpeas, kidney beans, and spinach.

## 2020-02-23 LAB — SURGICAL PATHOLOGY

## 2020-03-02 ENCOUNTER — Ambulatory Visit: Payer: Medicare Other | Admitting: Dermatology

## 2020-03-18 ENCOUNTER — Other Ambulatory Visit: Payer: Self-pay | Admitting: Gastroenterology

## 2020-03-23 NOTE — Telephone Encounter (Signed)
Please find out if patient is taking omeprazole once or twice daily.

## 2020-03-29 NOTE — Telephone Encounter (Signed)
Rx sent 

## 2020-03-29 NOTE — Telephone Encounter (Signed)
Pt is taking omeprazole 20mg  once in the morning before breakfast

## 2020-05-31 ENCOUNTER — Ambulatory Visit (INDEPENDENT_AMBULATORY_CARE_PROVIDER_SITE_OTHER): Payer: Medicare Other | Admitting: Nurse Practitioner

## 2020-05-31 ENCOUNTER — Other Ambulatory Visit: Payer: Self-pay

## 2020-05-31 ENCOUNTER — Encounter: Payer: Self-pay | Admitting: Nurse Practitioner

## 2020-05-31 VITALS — BP 141/79 | HR 64 | Temp 96.8°F | Ht 62.0 in | Wt 167.4 lb

## 2020-05-31 DIAGNOSIS — K5901 Slow transit constipation: Secondary | ICD-10-CM

## 2020-05-31 DIAGNOSIS — K219 Gastro-esophageal reflux disease without esophagitis: Secondary | ICD-10-CM | POA: Diagnosis not present

## 2020-05-31 DIAGNOSIS — R1011 Right upper quadrant pain: Secondary | ICD-10-CM

## 2020-05-31 MED ORDER — OMEPRAZOLE 40 MG PO CPDR: 40.0000 mg | DELAYED_RELEASE_CAPSULE | Freq: Every day | ORAL | 3 refills | Status: DC

## 2020-05-31 NOTE — Assessment & Plan Note (Signed)
The patient has begun having some worsening GERD symptoms recently.  Symptoms as per HPI.  Previously tried and failed Protonix.  Cannot afford Dexilant or AcipHex.  She is currently on Prilosec 20 mg daily.  I will increase this to 40 mg daily.  Breakthrough symptoms are at random, not particularly linked to evening time so I do not think.  The necessarily of benefit to adding an evening 20 mg dose.  I have asked her to call us in 1 month if she does not have any improvement we can discuss other options.  Continue to avoid triggers and avoid evening time eating.  Further recommendations to follow pending her clinical response to PPI dose change.  Follow-up in 3 months.

## 2020-05-31 NOTE — Assessment & Plan Note (Signed)
Right upper quadrant pain, was previously told she needs her gallbladder out.  She is seeing a Psychologist, sport and exercise twice but has not pulled the trigger on surgery yet.  She states that she is going to be discussed with her surgeon.  I have asked her to notify us if she needs another referral which we can provide.

## 2020-05-31 NOTE — Patient Instructions (Signed)
Your health issues we discussed today were:   GERD (reflux/heartburn): 1. Start taking Prilosec 40 mg once a day in the morning (you can take two of your 20 mg pills for now, I will send a new prescription to your pharmacy for the updated strength) 2. Avoid trigger foods that make your symptoms worse 3. Continue to avoid eating after 5:30 in the evening 4. Call me in 1 month if you do not have improvement with the increased dose of Prilosec  Constipation: 1. Continue taking MiraLAX every day 2. You can add a second dose later in the day, if needed for worsening constipation 3. Let us know if you have any worsening or severe symptoms  Abdominal pain on your right upper side: 1. I would again approach the surgeon and discuss possible gallbladder removal 2. Call us if you need a new referral to a surgeon's office  Overall I recommend:  1. Continue your other current medications 2. Return for follow-up in 3 months 3. Call us for any questions or concerns   ---------------------------------------------------------------  I am glad you have gotten your COVID-19 vaccination!  Even though you are fully vaccinated you should continue to follow CDC and state/local guidelines.  ---------------------------------------------------------------   At [facility] we value your feedback. You may receive a survey about your visit today. Please share your experience as we strive to create trusting relationships with our patients to provide genuine, compassionate, quality care.  We appreciate your understanding and patience as we review any laboratory studies, imaging, and other diagnostic tests that are ordered as we care for you. Our office policy is 5 business days for review of these results, and any emergent or urgent results are addressed in a timely manner for your best interest. If you do not hear from our office in 1 week, please contact us.   We also encourage the use of  MyChart, which contains your medical information for your review as well. If you are not enrolled in this feature, an access code is on this after visit summary for your convenience. Thank you for allowing [place] to be involved in your care.  It was great to see you today!  I hope you have a great rest of your Summer!!

## 2020-05-31 NOTE — Assessment & Plan Note (Signed)
She does have some constipation, currently I am Colace daily and MiraLAX once daily on most days.  This helps her constipation, but no resolution.  Previous prescription medication would have been too expensive, but she cannot member what the medication was called.  I recommended she take MiraLAX once a day, every day.  She can add a second dose in the evenings as needed based on her current status with her constipation.  She verbalized understanding.  Follow-up in 3 months.
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
## 2020-06-01 ENCOUNTER — Ambulatory Visit: Payer: Medicare Other | Admitting: Dermatology

## 2020-06-08 ENCOUNTER — Ambulatory Visit (INDEPENDENT_AMBULATORY_CARE_PROVIDER_SITE_OTHER): Payer: Medicare Other | Admitting: Dermatology

## 2020-06-08 ENCOUNTER — Other Ambulatory Visit: Payer: Self-pay

## 2020-06-08 ENCOUNTER — Encounter: Payer: Self-pay | Admitting: Dermatology

## 2020-06-08 DIAGNOSIS — Z85828 Personal history of other malignant neoplasm of skin: Secondary | ICD-10-CM

## 2020-06-08 DIAGNOSIS — L820 Inflamed seborrheic keratosis: Secondary | ICD-10-CM

## 2020-06-08 NOTE — Progress Notes (Signed)
   Follow-Up Visit   Subjective  Victoria Molina is a 74 y.o. female who presents for the following: growth (post scalp, 16m, irritated by comb).  The following portions of the chart were reviewed this encounter and updated as appropriate:  Tobacco  Allergies  Meds  Problems  Med Hx  Surg Hx  Fam Hx     Review of Systems:  No other skin or systemic complaints except as noted in HPI or Assessment and Plan.  Objective  Well appearing patient in no apparent distress; mood and affect are within normal limits.  A focused examination was performed including scalp. Relevant physical exam findings are noted in the Assessment and Plan.  Objective  R crown scalp x 1: Erythematous keratotic or waxy stuck-on papule or plaque.   Objective  Forehead: Well healed scar with no evidence of recurrence.    Assessment & Plan  Inflamed seborrheic keratosis R crown scalp x 1  Destruction of lesion - R crown scalp x 1 Complexity: simple   Destruction method: cryotherapy   Informed consent: discussed and consent obtained   Timeout:  patient name, date of birth, surgical site, and procedure verified Lesion destroyed using liquid nitrogen: Yes   Region frozen until ice ball extended beyond lesion: Yes   Outcome: patient tolerated procedure well with no complications   Post-procedure details: wound care instructions given    History of basal cell carcinoma (BCC) Forehead  Clear. Observe for recurrence. Call clinic for new or changing lesions.  Recommend regular skin exams, daily broad-spectrum spf 30+ sunscreen use, and photoprotection.     Return if symptoms worsen or fail to improve.   I, Othelia Pulling, RMA, am acting as scribe for Sarina Ser, MD .  Documentation: I have reviewed the above documentation for accuracy and completeness, and I agree with the above.  Sarina Ser, MD

## 2020-06-15 ENCOUNTER — Encounter: Payer: Self-pay | Admitting: Dermatology

## 2020-08-18 ENCOUNTER — Other Ambulatory Visit: Payer: Self-pay | Admitting: Gastroenterology

## 2020-08-31 ENCOUNTER — Ambulatory Visit: Payer: Medicare Other | Admitting: Nurse Practitioner

## 2020-10-18 ENCOUNTER — Ambulatory Visit: Payer: Medicare Other | Admitting: Nurse Practitioner

## 2020-11-30 ENCOUNTER — Encounter: Payer: Self-pay | Admitting: Nurse Practitioner

## 2020-11-30 ENCOUNTER — Encounter: Payer: Self-pay | Admitting: Internal Medicine

## 2020-11-30 ENCOUNTER — Ambulatory Visit (INDEPENDENT_AMBULATORY_CARE_PROVIDER_SITE_OTHER): Payer: Medicare Other | Admitting: Nurse Practitioner

## 2020-11-30 ENCOUNTER — Other Ambulatory Visit: Payer: Self-pay

## 2020-11-30 VITALS — BP 133/76 | HR 64 | Temp 96.8°F | Ht 62.0 in | Wt 175.0 lb

## 2020-11-30 DIAGNOSIS — K219 Gastro-esophageal reflux disease without esophagitis: Secondary | ICD-10-CM | POA: Diagnosis not present

## 2020-11-30 DIAGNOSIS — R112 Nausea with vomiting, unspecified: Secondary | ICD-10-CM | POA: Diagnosis not present

## 2020-11-30 DIAGNOSIS — K5901 Slow transit constipation: Secondary | ICD-10-CM | POA: Diagnosis not present

## 2020-11-30 DIAGNOSIS — R1011 Right upper quadrant pain: Secondary | ICD-10-CM | POA: Diagnosis not present
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
## 2020-11-30 NOTE — Patient Instructions (Signed)
Your health issues we discussed today were:   GERD (reflux/heartburn) with nausea: 1. Continue taking your acid blocker (omeprazole 20 mg twice a day) 2. You can use Tums as needed for "breakthrough" GERD symptoms/indigestion 3. Try to avoid foods that are known to cause a flare of your symptoms 4. Call us for any worsening or severe symptoms  Constipation: 1. Hold Colace and MiraLAX for now 2. I am giving you samples of Linzess 72 mcg (low-dose).  Take this once a day on an empty stomach 3. As we discussed, some people develop diarrhea.  If you do it should resolve on its own in a few days 4. Call us if you develop diarrhea that is persistent for more than 7 days or severe/intolerable 5. Call us in 2 weeks and let us know if this is helping you move your bowels better 6. Call us if you have any worsening or severe symptoms  Ongoing upper right abdominal pain: 1. You can schedule a follow-up with the surgeon to discuss gallbladder removal as previously recommended  Overall I recommend:  1. Continue other current medications 2. Return for follow-up in 2 months 3. Call us for any questions or concerns   ---------------------------------------------------------------  I am glad you have gotten your COVID-19 vaccination!  Even though you are fully vaccinated you should continue to follow CDC and state/local guidelines.  ---------------------------------------------------------------   At [facility] we value your feedback. You may receive a survey about your visit today. Please share your experience as we strive to create trusting relationships with our patients to provide genuine, compassionate, quality care.  We appreciate your understanding and patience as we review any laboratory studies, imaging, and other diagnostic tests that are ordered as we care for you. Our office policy is 5 business days for review of these results, and any emergent or urgent results are  addressed in a timely manner for your best interest. If you do not hear from our office in 1 week, please contact us.   We also encourage the use of MyChart, which contains your medical information for your review as well. If you are not enrolled in this feature, an access code is on this after visit summary for your convenience. Thank you for allowing [place] to be involved in your care.  It was great to see you today!  I hope you have a safe and warm winter!!

## 2020-12-06 ENCOUNTER — Telehealth: Payer: Self-pay | Admitting: Internal Medicine

## 2020-12-06 NOTE — Telephone Encounter (Signed)
Phoned the pt back and was advised that she hasn't been eating a lot because she is trying to loose weight. She went to the bathroom once today but she feels the need to go again. I advised her to drink more water and she has to eat in order to have a BM. Pt states she will do it and touch base with [place] Friday.

## 2020-12-06 NOTE — Telephone Encounter (Signed)
Patient called and said that she was given Linzess samples and she took two this morning and has not had a bowel movement.  Is there something else she can try

## 2021-01-23 ENCOUNTER — Other Ambulatory Visit: Payer: Self-pay

## 2021-01-23 ENCOUNTER — Ambulatory Visit (INDEPENDENT_AMBULATORY_CARE_PROVIDER_SITE_OTHER): Payer: Medicare Other | Admitting: Dermatology

## 2021-01-23 DIAGNOSIS — D492 Neoplasm of unspecified behavior of bone, soft tissue, and skin: Secondary | ICD-10-CM | POA: Diagnosis not present

## 2021-01-23 NOTE — Progress Notes (Signed)
   Follow-Up Visit   Subjective  Victoria Molina is a 75 y.o. female who presents for the following: Skin Problem (Check a dark mole at the L hairline scalp, pt report this area has been here for several years ).  The following portions of the chart were reviewed this encounter and updated as appropriate:   Tobacco  Allergies  Meds  Problems  Med Hx  Surg Hx  Fam Hx     Review of Systems:  No other skin or systemic complaints except as noted in HPI or Assessment and Plan.  Objective  Well appearing patient in no apparent distress; mood and affect are within normal limits.  A focused examination was performed including face,scalp . Relevant physical exam findings are noted in the Assessment and Plan.  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  IMarye Round, CMA, am acting as scribe for Sarina Ser, MD .  Documentation: I have reviewed the above documentation for accuracy and completeness, and I agree with the above.  Sarina Ser, MD

## 2021-01-24 ENCOUNTER — Encounter: Payer: Self-pay | Admitting: Dermatology

## 2021-01-24 ENCOUNTER — Telehealth: Payer: Self-pay

## 2021-01-24 NOTE — Telephone Encounter (Signed)
Called pt discussed we received her biopsy reports from [place] and we do not have a biopsy from the Left temple hairline area, so we will remove that area at her appointment here February 13, 2021

## 2021-01-25 ENCOUNTER — Other Ambulatory Visit: Payer: Self-pay | Admitting: Internal Medicine

## 2021-01-25 DIAGNOSIS — R1032 Left lower quadrant pain: Secondary | ICD-10-CM

## 2021-02-01 ENCOUNTER — Ambulatory Visit (INDEPENDENT_AMBULATORY_CARE_PROVIDER_SITE_OTHER): Payer: Medicare Other | Admitting: Nurse Practitioner

## 2021-02-01 ENCOUNTER — Encounter: Payer: Self-pay | Admitting: Nurse Practitioner

## 2021-02-01 ENCOUNTER — Other Ambulatory Visit: Payer: Self-pay

## 2021-02-01 VITALS — BP 124/71 | HR 73 | Temp 96.8°F | Ht 62.0 in | Wt 170.6 lb

## 2021-02-01 DIAGNOSIS — R112 Nausea with vomiting, unspecified: Secondary | ICD-10-CM | POA: Diagnosis not present

## 2021-02-01 DIAGNOSIS — R1011 Right upper quadrant pain: Secondary | ICD-10-CM | POA: Diagnosis not present

## 2021-02-01 DIAGNOSIS — K219 Gastro-esophageal reflux disease without esophagitis: Secondary | ICD-10-CM | POA: Diagnosis not present

## 2021-02-01 DIAGNOSIS — K5901 Slow transit constipation: Secondary | ICD-10-CM | POA: Diagnosis not present

## 2021-02-01 NOTE — Patient Instructions (Signed)
Your health issues we discussed today were:   GERD (reflux/heartburn): 1. Continue taking omeprazole for now 2. Let us know if you have any worsening or severe symptoms  Constipation: 1. I am giving you a higher dose of Linzess (145 mcg).  Take this once a day, every day 2. Call us in 1 to 2 weeks and let us know: If you are taking it every day, if it is working well enough.  If it is not working well enough, ask if the dose can be increased 3. Call us for any worsening or severe symptoms 4. If Linzess ends up not working at any dose, we can try other medications  Abdominal pain and nausea: 1. Let us know when you like to proceed with a referral to a surgeon 2. If you need a new referral we can provide one for you  Overall I recommend:  1. Continue other current medications 2. Return for follow-up in 2 months 3. Call us for any questions or concerns   ---------------------------------------------------------------  I am glad you have gotten your COVID-19 vaccination!  Even though you are fully vaccinated you should continue to follow CDC and state/local guidelines.  ---------------------------------------------------------------   At [facility] we value your feedback. You may receive a survey about your visit today. Please share your experience as we strive to create trusting relationships with our patients to provide genuine, compassionate, quality care.  We appreciate your understanding and patience as we review any laboratory studies, imaging, and other diagnostic tests that are ordered as we care for you. Our office policy is 5 business days for review of these results, and any emergent or urgent results are addressed in a timely manner for your best interest. If you do not hear from our office in 1 week, please contact us.   We also encourage the use of MyChart, which contains your medical information for your review as well. If you are not enrolled in this  feature, an access code is on this after visit summary for your convenience. Thank you for allowing [place] to be involved in your care.  It was great to see you today!  I hope you have a happy Easter!!
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
## 2021-02-08 ENCOUNTER — Ambulatory Visit
Admission: RE | Admit: 2021-02-08 | Discharge: 2021-02-08 | Disposition: A | Discharge status: Home / Self Care | Payer: Medicare Other | Source: Ambulatory Visit | Attending: Internal Medicine | Admitting: Internal Medicine

## 2021-02-08 ENCOUNTER — Other Ambulatory Visit: Payer: Self-pay

## 2021-02-08 DIAGNOSIS — R1032 Left lower quadrant pain: Secondary | ICD-10-CM | POA: Insufficient documentation

## 2021-02-08 MED ORDER — IOHEXOL 300 MG/ML  SOLN
80.0000 mL | Freq: Once | INTRAMUSCULAR | Status: AC | PRN
  Administered 2021-02-08: 80 mL via INTRAVENOUS

## 2021-02-13 ENCOUNTER — Other Ambulatory Visit: Payer: Self-pay

## 2021-02-13 ENCOUNTER — Ambulatory Visit (INDEPENDENT_AMBULATORY_CARE_PROVIDER_SITE_OTHER): Payer: Medicare Other | Admitting: Dermatology

## 2021-02-13 DIAGNOSIS — D224 Melanocytic nevi of scalp and neck: Secondary | ICD-10-CM | POA: Diagnosis not present

## 2021-02-13 DIAGNOSIS — D485 Neoplasm of uncertain behavior of skin: Secondary | ICD-10-CM

## 2021-02-13 NOTE — Progress Notes (Signed)
   Follow-Up Visit   Subjective  Victoria Molina is a 75 y.o. female who presents for the following: Other (Blue nevus vs other of left temple hairline - shave removal today).  The following portions of the chart were reviewed this encounter and updated as appropriate:   Tobacco  Allergies  Meds  Problems  Med Hx  Surg Hx  Fam Hx     Review of Systems:  No other skin or systemic complaints except as noted in HPI or Assessment and Plan.  Objective  Well appearing patient in no apparent distress; mood and affect are within normal limits.  A focused examination was performed including face/scalp. Relevant physical exam findings are noted in the Assessment and Plan.  Objective  Left Temple Hairline: 0.4 cm blue macule   Assessment & Plan  Neoplasm of uncertain behavior of skin Left Temple Hairline  Epidermal / dermal shaving  Lesion diameter (cm):  0.4 Informed consent: discussed and consent obtained   Timeout: patient name, date of birth, surgical site, and procedure verified   Procedure prep:  Patient was prepped and draped in usual sterile fashion Prep type:  Isopropyl alcohol Anesthesia: the lesion was anesthetized in a standard fashion   Anesthetic:  1% lidocaine w/ epinephrine 1-100,000 buffered w/ 8.4% NaHCO3 Instrument used: flexible razor blade   Hemostasis achieved with: pressure, aluminum chloride and electrodesiccation   Outcome: patient tolerated procedure well   Post-procedure details: sterile dressing applied and wound care instructions given   Dressing type: bandage and petrolatum    Specimen 1 - Surgical pathology Differential Diagnosis: Blue nevus vs other R/o dysplasia Check Margins: No 0.4 cm blue macule  Return if symptoms worsen or fail to improve.  I, Ashok Cordia, CMA, am acting as scribe for Sarina Ser, MD .  Documentation: I have reviewed the above documentation for accuracy and completeness, and I agree with the above.  Sarina Ser, MD

## 2021-02-13 NOTE — Patient Instructions (Signed)

## 2021-02-14 ENCOUNTER — Telehealth: Payer: Self-pay

## 2021-02-14 NOTE — Telephone Encounter (Signed)
-----   Message from Ralene Bathe, MD sent at 02/14/2021  5:14 PM EDT ----- Diagnosis Skin , left temple hairline BLUE NEVUS, BASE INVOLVED  Benign "blue nevus" = mole May see some persistent color but not a problem No further treatment needed.

## 2021-02-14 NOTE — Telephone Encounter (Signed)
Advised patient of results/hd  

## 2021-02-16 ENCOUNTER — Encounter: Payer: Self-pay | Admitting: Dermatology

## 2021-02-20 ENCOUNTER — Telehealth: Payer: Self-pay

## 2021-02-20 NOTE — Telephone Encounter (Signed)
Victoria Molina, Pt [facility] w [facility] that was in last week phoned to say that the Linzess 145 mcg works better for her and would like for you to send a Rx in for her for the 145 mcg.

## 2021-02-21 ENCOUNTER — Other Ambulatory Visit: Payer: Self-pay | Admitting: Gastroenterology

## 2021-02-21 ENCOUNTER — Encounter: Payer: Self-pay | Admitting: Internal Medicine

## 2021-02-21 DIAGNOSIS — K5904 Chronic idiopathic constipation: Secondary | ICD-10-CM

## 2021-02-21 MED ORDER — LINACLOTIDE 145 MCG PO CAPS: 145.0000 ug | ORAL_CAPSULE | Freq: Every day | ORAL | 1 refills | Status: DC

## 2021-02-21 NOTE — Telephone Encounter (Signed)
Rx sent 

## 2021-02-21 NOTE — Telephone Encounter (Signed)
noted 

## 2021-02-23 ENCOUNTER — Telehealth: Payer: Self-pay | Admitting: Internal Medicine

## 2021-02-23 NOTE — Telephone Encounter (Signed)
Pt wanted the nurse to know that she could pick up samples on Tuesday because she has a doctor's appointment on Monday.

## 2021-02-23 NOTE — Telephone Encounter (Signed)
Pt called this morning to let us know that she could not afford Linzess. Her pharmacist told her it would cost her $960. Please advise. [phone]

## 2021-02-23 NOTE — Telephone Encounter (Signed)
Phoned and spoke with the pt and advised I will do a PA for her if she wants this medication. She stated yes. I also offered samples to her and she states she will call me back Monday to let me know when she can get here.

## 2021-04-18 ENCOUNTER — Ambulatory Visit: Payer: Medicare Other | Admitting: Gastroenterology

## 2021-06-27 ENCOUNTER — Ambulatory Visit (INDEPENDENT_AMBULATORY_CARE_PROVIDER_SITE_OTHER): Payer: Medicare Other | Admitting: Gastroenterology

## 2021-06-27 ENCOUNTER — Encounter: Payer: Self-pay | Admitting: Gastroenterology

## 2021-06-27 ENCOUNTER — Other Ambulatory Visit: Payer: Self-pay

## 2021-06-27 VITALS — BP 144/77 | HR 66 | Temp 97.1°F | Ht 62.0 in | Wt 175.2 lb

## 2021-06-27 DIAGNOSIS — K5900 Constipation, unspecified: Secondary | ICD-10-CM | POA: Diagnosis not present

## 2021-06-27 DIAGNOSIS — K219 Gastro-esophageal reflux disease without esophagitis: Secondary | ICD-10-CM | POA: Diagnosis not present

## 2021-06-27 DIAGNOSIS — R1011 Right upper quadrant pain: Secondary | ICD-10-CM | POA: Diagnosis not present

## 2021-06-27 MED ORDER — LUBIPROSTONE 8 MCG PO CAPS: 8.0000 ug | ORAL_CAPSULE | Freq: Two times a day (BID) | ORAL | 3 refills | Status: DC

## 2021-06-27 MED ORDER — PANTOPRAZOLE SODIUM 40 MG PO TBEC: 40.0000 mg | DELAYED_RELEASE_TABLET | Freq: Every day | ORAL | 3 refills | Status: DC
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
## 2021-06-27 NOTE — Patient Instructions (Signed)
For reflux: I have sent in a new medication called pantoprazole (Protonix). Let's try this instead of omeprazole (Prilosec). Take once each morning, 30 minutes before breakfast, as it is best absorbed this way.  For constipation: I sent in a medication called Amitiza. I hope this will be covered better! If it is, you will take it twice a day WITH FOOD to avoid nausea.   Let me know if any issues with getting the medications!  We will see you in 6 months!  It was a pleasure to see you today. I want to create trusting relationships with patients to provide genuine, compassionate, and quality care. I value your feedback. If you receive a survey regarding your visit,  I greatly appreciate you taking time to fill this out.   Annitta Needs, PhD, ANP-BC [facility]

## 2021-07-06 ENCOUNTER — Telehealth: Payer: Self-pay | Admitting: Internal Medicine

## 2021-07-06 NOTE — Telephone Encounter (Signed)
Pt said the constipation medicine was too expensive and her pharmacy didn't fill it. She said she would try something else. [phone]

## 2021-07-06 NOTE — Telephone Encounter (Signed)
Phoned and spoke with the pt, she advised that she would like her medication for constipation changed. We have tried Linzess with PA, too expensive for the pt. I just tried Amitiza which doesn't need a PA but too expensive for the pt. She is advising that she would like to try another medication. I don't know what the pt is willing to pay but the Amitiza was under 100.00. I'm sending this to you because Vicente Males is out and returns next Tuesday. Please advise

## 2021-07-06 NOTE — Telephone Encounter (Signed)
Forwarded to [employer]

## 2021-07-06 NOTE — Telephone Encounter (Signed)
Would recommend going to Miralax, can get OTC. Please let pt know.  She can take one capful three times a day until she has couple of soft stools, then she should continue one capful daily.  Saving for Victoria Molina, upon return in case other recommendations.

## 2021-07-06 NOTE — Telephone Encounter (Signed)
Phoned the pt back and advised the Miralax (otc). Gave the pt directions on how she should use it and that you were saving this for Vicente Males upon her return incase there may be further recommendations. The pt expressed understanding.

## 2021-07-11 NOTE — Telephone Encounter (Signed)
Agree with recommendations. Options limited. Recommend Miralax.

## 2021-07-11 NOTE — Telephone Encounter (Signed)
Phoned the pt and advised her that we had limited options and Miralax is recommended at this point. The pt  states it is helping and she will continue with it @ this time

## 2021-09-13 ENCOUNTER — Other Ambulatory Visit: Payer: Self-pay | Admitting: Orthopedic Surgery

## 2021-09-13 DIAGNOSIS — M25512 Pain in left shoulder: Secondary | ICD-10-CM

## 2021-09-25 ENCOUNTER — Telehealth: Payer: Self-pay | Admitting: Gastroenterology

## 2021-09-25 MED ORDER — OMEPRAZOLE 20 MG PO CPDR: 20.0000 mg | DELAYED_RELEASE_CAPSULE | Freq: Every day | ORAL | 5 refills | Status: DC

## 2021-09-25 NOTE — Telephone Encounter (Signed)
Sent to [facility]

## 2021-09-25 NOTE — Telephone Encounter (Signed)
Returned the pt's call she wants to switch back to Omeprazole because the Pantoprazole has her feeling funny (but pt could not explain how it did/symptoms). Please advised

## 2021-09-25 NOTE — Telephone Encounter (Signed)
She can go back on pantoprazole. Does she need another prescription?

## 2021-09-25 NOTE — Telephone Encounter (Signed)
The pt will need another one because states she is not for sure but I seen where she had 5 refills I June for Omeprazole but pharmacy probably would not fill since the Pantoprazole is the one that had been filled lately

## 2021-09-25 NOTE — Telephone Encounter (Signed)
Phoned and advised of Rx for Omeprazole being sent to pharmacy

## 2021-09-25 NOTE — Telephone Encounter (Signed)
Patient wants to go back on her omeprazol does not like the new medication we put her on

## 2021-09-25 NOTE — Addendum Note (Signed)
Addended by: Annitta Needs on: 09/25/2021 01:18 PM   Modules accepted: Orders

## 2021-09-27 ENCOUNTER — Other Ambulatory Visit: Payer: Self-pay

## 2021-09-27 MED ORDER — OMEPRAZOLE 20 MG PO CPDR: 20.0000 mg | DELAYED_RELEASE_CAPSULE | Freq: Every day | ORAL | 5 refills | Status: DC

## 2021-09-27 NOTE — Addendum Note (Signed)
Addended by: Annitta Needs on: 09/27/2021 12:49 PM   Modules accepted: Orders

## 2021-09-27 NOTE — Telephone Encounter (Signed)
Phoned and advised the pt that [facility] [place]

## 2021-09-27 NOTE — Telephone Encounter (Signed)
Completed.

## 2021-09-27 NOTE — Telephone Encounter (Signed)
error 

## 2021-09-27 NOTE — Telephone Encounter (Signed)
Patient called. Rx sent to incorrect pharmacy. [facility] [place].

## 2021-10-07 ENCOUNTER — Ambulatory Visit
Admission: RE | Admit: 2021-10-07 | Discharge: 2021-10-07 | Disposition: A | Discharge status: Home / Self Care | Payer: Medicare Other | Source: Ambulatory Visit | Attending: Orthopedic Surgery | Admitting: Orthopedic Surgery

## 2021-10-07 ENCOUNTER — Other Ambulatory Visit: Payer: Self-pay

## 2021-10-07 DIAGNOSIS — M25512 Pain in left shoulder: Secondary | ICD-10-CM

## 2021-12-12 ENCOUNTER — Ambulatory Visit: Payer: Medicare Other | Admitting: Dermatology

## 2021-12-26 ENCOUNTER — Ambulatory Visit (INDEPENDENT_AMBULATORY_CARE_PROVIDER_SITE_OTHER): Payer: Medicare Other | Admitting: Gastroenterology

## 2021-12-26 ENCOUNTER — Other Ambulatory Visit: Payer: Self-pay

## 2021-12-26 ENCOUNTER — Encounter: Payer: Self-pay | Admitting: Gastroenterology

## 2021-12-26 VITALS — BP 124/72 | HR 84 | Temp 97.7°F | Ht 62.0 in | Wt 183.0 lb

## 2021-12-26 DIAGNOSIS — K5900 Constipation, unspecified: Secondary | ICD-10-CM

## 2021-12-26 DIAGNOSIS — K219 Gastro-esophageal reflux disease without esophagitis: Secondary | ICD-10-CM | POA: Diagnosis not present

## 2021-12-26 MED ORDER — FAMOTIDINE 20 MG PO TABS: 20.0000 mg | ORAL_TABLET | Freq: Every day | ORAL | 3 refills | Status: DC
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
## 2021-12-26 NOTE — Patient Instructions (Signed)
Continue omeprazole daily. You can add Pepcid in evenings as needed. ? ?We will see you in 6 months! ? ?Let me know if discomfort continues.  ? ?I enjoyed seeing you again today! As you know, I value our relationship and want to provide genuine, compassionate, and quality care. I welcome your feedback. If you receive a survey regarding your visit,  I greatly appreciate you taking time to fill this out. See you next time! ? ?Annitta Needs, PhD, ANP-BC ?[facility]  ? ?

## 2022-01-11 ENCOUNTER — Ambulatory Visit: Payer: Medicare Other | Admitting: Dermatology

## 2022-01-19 ENCOUNTER — Other Ambulatory Visit: Payer: Self-pay | Admitting: Gastroenterology

## 2022-01-19 ENCOUNTER — Telehealth: Payer: Self-pay

## 2022-01-19 DIAGNOSIS — R112 Nausea with vomiting, unspecified: Secondary | ICD-10-CM

## 2022-01-19 MED ORDER — ONDANSETRON HCL 4 MG PO TABS: 4.0000 mg | ORAL_TABLET | Freq: Three times a day (TID) | ORAL | 1 refills | Status: AC | PRN

## 2022-01-19 NOTE — Telephone Encounter (Signed)
Victoria Molina, ?I had sent this pt message to Magda Paganini (forgot she was off) per Magda Paganini to send this to a APP in office. ?

## 2022-01-19 NOTE — Telephone Encounter (Signed)
Noted.  Rx sent.

## 2022-01-19 NOTE — Telephone Encounter (Signed)
Please call pt, she was up sick all night with abdominal pain and vomited. ?

## 2022-01-19 NOTE — Telephone Encounter (Signed)
I would be happy to send in a Rx for Zofran 4 mg q8 hours as needed if she has not already called her PCP for the prescription. Suspect she may have an acute viral gastroenteritis. ? ?Would recommend following a bland diet over the next few days. ?Focus on maintaining good hydration with liquids that contain electrolytes such as Pedialyte, G2, etc. ?Proceed to the emergency room if she has any severe abdominal pain, fever, unable to keep food or liquids down, feels lightheaded, dizzy, like she will pass out. ?Be sure not take MiraLAX while having diarrhea.  ?Call back next week if persistent symptoms. ? ?*Please let me know if I need to send in the prescription for Zofran. ?

## 2022-01-19 NOTE — Telephone Encounter (Signed)
Noted ? ?FYI: ?Phoned and advised the pt of her Rx being sent ?

## 2022-01-19 NOTE — Telephone Encounter (Signed)
I returned the pt's call and was advised by her that last night about 9 pm she started having R abdomen pain underneath the rib cage. Her pain is at a level 6. About 11 pm she starting having diarrhea and its still going on now. Pt has nausea and vomiting as well. it started about the same time and she doesn't know how much she has had diarrhea and vomiting. I advised the pt that she may have to go to ED to be evaluated but the pt states she can deal with diarrhea but the nausea and vomiting she cannot and just wants a Rx for it.  ? ?Pt just called back and stated she was going to call her PCP and get him to call in Rx for nausea because she doesn't need anything but that done for her. I advised her to do to ED due to she may be dehydrated and she declined. I also let it be known that she had called her so I have to finish this note to you. Please advise ?

## 2022-01-19 NOTE — Telephone Encounter (Signed)
I phoned the pt back and was advised by her that she called her PCP and they told her it would be later today before she would get a response from her PCP. Pt states she's too weak to barely get out of bed. She does want you to send in a Rx for her so someone can pick it up. ?Also she was advised of the recommendations regarding keeping hydrated, ED symptoms, medication use and to call back next week if symptoms does not resolve. Pt expressed understanding of all these instructions. She advised me to thank you for sending Rx in. ?

## 2022-03-07 ENCOUNTER — Other Ambulatory Visit: Payer: Self-pay | Admitting: Gastroenterology

## 2022-03-09 NOTE — Telephone Encounter (Signed)
Pt LMOVM needing refills on her Omeprazole. [facility] stated [facility] [place] [facility]. We have not received anything. Her last ov was 09/25/2021.

## 2022-03-12 ENCOUNTER — Telehealth: Payer: Self-pay

## 2022-03-12 NOTE — Telephone Encounter (Signed)
Phoned and advised the pt of her Rx being sent in to her pharmacy 

## 2022-03-12 NOTE — Telephone Encounter (Signed)
Victoria Molina, This pt has phoned again stating that the [facility] [place] [facility] not [facility] a [facility]ill for her. I see where you had sent it in. Was it signed or was [facility] the right [facility]? I tried calling pt back but she didn't answer so I LMOVM for her. Can you please check your end please.

## 2022-03-12 NOTE — Telephone Encounter (Signed)
I sent in just now. I hadn't done it yet. Thanks!

## 2022-04-23 ENCOUNTER — Telehealth: Payer: Self-pay

## 2022-04-23 NOTE — Telephone Encounter (Signed)
Phoned and advised the pt of recommendations from Vicente Males and the pt agrees. Sent call to [facility]

## 2022-04-23 NOTE — Telephone Encounter (Signed)
Needs office visit. I had recommended EGD at last appt but that as 3 months ago. This is an ongoing issue.

## 2022-04-23 NOTE — Telephone Encounter (Signed)
Returned the pt's call and was advised of off and on (for about 15 minutes every morning for week and a half) that she wakes up with a pain on her left side underneath the breast. Pt had diarrhea day before yesterday and yesterday. Diarrhea 3 to 4 times those days. No diarrhea now or pain. States she takes a tylenol and it goes away. She was on vacation last week and it began then. Pain is a dull pain without nausea and vomiting. Please advise

## 2022-05-17 ENCOUNTER — Other Ambulatory Visit: Payer: Self-pay | Admitting: Physical Medicine and Rehabilitation

## 2022-05-17 DIAGNOSIS — R310 Gross hematuria: Secondary | ICD-10-CM

## 2022-05-24 ENCOUNTER — Ambulatory Visit
Admission: RE | Admit: 2022-05-24 | Discharge: 2022-05-24 | Disposition: A | Discharge status: Home / Self Care | Payer: Medicare Other | Source: Ambulatory Visit | Attending: Physical Medicine and Rehabilitation | Admitting: Physical Medicine and Rehabilitation

## 2022-05-24 DIAGNOSIS — R310 Gross hematuria: Secondary | ICD-10-CM | POA: Diagnosis present
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
## 2022-06-13 ENCOUNTER — Encounter: Payer: Self-pay | Admitting: Gastroenterology

## 2022-06-13 ENCOUNTER — Ambulatory Visit (INDEPENDENT_AMBULATORY_CARE_PROVIDER_SITE_OTHER): Payer: Medicare Other | Admitting: Gastroenterology

## 2022-06-13 VITALS — BP 116/74 | HR 65 | Temp 98.3°F | Ht 62.0 in | Wt 178.9 lb

## 2022-06-13 DIAGNOSIS — R1011 Right upper quadrant pain: Secondary | ICD-10-CM | POA: Diagnosis not present

## 2022-06-13 NOTE — Patient Instructions (Signed)
Continue omeprazole daily.   We have arranged a HIDA scan to check your gallbladder function.  I will see you in 3 months!  I enjoyed seeing you again today! As you know, I value our relationship and want to provide genuine, compassionate, and quality care. I welcome your feedback. If you receive a survey regarding your visit,  I greatly appreciate you taking time to fill this out. See you next time!  Annitta Needs, PhD, ANP-BC [facility]

## 2022-06-20 ENCOUNTER — Other Ambulatory Visit (HOSPITAL_COMMUNITY): Payer: Medicare Other

## 2022-06-21 ENCOUNTER — Other Ambulatory Visit (HOSPITAL_COMMUNITY): Payer: Medicare Other

## 2022-06-26 ENCOUNTER — Ambulatory Visit: Payer: Medicare Other | Admitting: Gastroenterology

## 2022-06-28 ENCOUNTER — Ambulatory Visit (HOSPITAL_COMMUNITY)
Admission: RE | Admit: 2022-06-28 | Discharge: 2022-06-28 | Disposition: A | Discharge status: Home / Self Care | Payer: Medicare Other | Source: Ambulatory Visit | Attending: Gastroenterology | Admitting: Gastroenterology

## 2022-06-28 DIAGNOSIS — R1011 Right upper quadrant pain: Secondary | ICD-10-CM | POA: Diagnosis present

## 2022-06-28 MED ORDER — SINCALIDE 5 MCG IJ SOLR
INTRAMUSCULAR | Status: AC
  Filled 2022-06-28: qty 5

## 2022-06-28 MED ORDER — STERILE WATER FOR INJECTION IJ SOLN
INTRAMUSCULAR | Status: AC
  Filled 2022-06-28: qty 10

## 2022-06-28 MED ORDER — TECHNETIUM TC 99M MEBROFENIN IV KIT
5.0000 | PACK | Freq: Once | INTRAVENOUS | Status: AC | PRN
  Administered 2022-06-28: 5.19 via INTRAVENOUS

## 2022-07-11 ENCOUNTER — Encounter: Payer: Self-pay | Admitting: *Deleted

## 2022-07-23 ENCOUNTER — Encounter (INDEPENDENT_AMBULATORY_CARE_PROVIDER_SITE_OTHER): Payer: Self-pay
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
## 2022-08-23 ENCOUNTER — Ambulatory Visit (INDEPENDENT_AMBULATORY_CARE_PROVIDER_SITE_OTHER): Payer: Medicare Other | Admitting: Gastroenterology

## 2022-08-23 ENCOUNTER — Encounter: Payer: Self-pay | Admitting: Gastroenterology

## 2022-08-23 VITALS — BP 146/74 | HR 66 | Temp 97.6°F | Ht 62.0 in | Wt 177.4 lb

## 2022-08-23 DIAGNOSIS — K649 Unspecified hemorrhoids: Secondary | ICD-10-CM

## 2022-08-23 DIAGNOSIS — R1011 Right upper quadrant pain: Secondary | ICD-10-CM | POA: Diagnosis not present

## 2022-08-23 DIAGNOSIS — K219 Gastro-esophageal reflux disease without esophagitis: Secondary | ICD-10-CM

## 2022-08-23 DIAGNOSIS — R112 Nausea with vomiting, unspecified: Secondary | ICD-10-CM

## 2022-08-23 DIAGNOSIS — K5904 Chronic idiopathic constipation: Secondary | ICD-10-CM | POA: Diagnosis not present

## 2022-08-23 NOTE — Patient Instructions (Addendum)
For your constipation: - I would you resume taking your fiber supplement on a daily basis. - Start taking MiraLAX 17 g (1 capful) daily in addition to your fiber. -Increase fiber in your diet - Ensure adequate water intake with 4-5 glasses of water per day. - Continue Anusol cream as needed for hemorrhoids -You may use Gas-X over-the-counter as needed for any bloating.  For your reflux: -Continue omeprazole 20 mg daily -Follow a GERD diet:  Avoid fried, fatty, greasy, spicy, citrus foods. Avoid caffeine and carbonated beverages. Avoid chocolate. Try eating 4-6 small meals a day rather than 3 large meals. Do not eat within 3 hours of laying down. Prop head of bed up on wood or bricks to create a 6 inch incline.  For your right upper quadrant pain continue taking Tylenol as needed.  If your pain becomes more severe on a daily basis or you begin experiencing more frequent nausea please contact the office.  We may need to refer you to general surgery for consideration of having her gallbladder removed at that point.  Reassuringly your most recent scans of your gallbladder was normal.  You may continue Zofran as needed for nausea.  It was a pleasure to see you today. I want to create trusting relationships with patients. If you receive a survey regarding your visit,  I greatly appreciate you taking time to fill this out on paper or through your MyChart. I value your feedback.  Venetia Night, MSN, FNP-BC, AGACNP-BC [facility]

## 2022-11-21 ENCOUNTER — Other Ambulatory Visit: Payer: Self-pay | Admitting: Obstetrics and Gynecology

## 2022-11-21 DIAGNOSIS — N644 Mastodynia: Secondary | ICD-10-CM

## 2022-11-27 ENCOUNTER — Inpatient Hospital Stay
Admission: RE | Admit: 2022-11-27 | Discharge: 2022-11-27 | Disposition: A | Discharge status: Home / Self Care | Payer: Self-pay | Source: Ambulatory Visit | Attending: *Deleted | Admitting: *Deleted

## 2022-11-27 ENCOUNTER — Other Ambulatory Visit: Payer: Self-pay | Admitting: *Deleted

## 2022-11-27 ENCOUNTER — Encounter: Payer: Self-pay | Admitting: Gastroenterology

## 2022-11-27 DIAGNOSIS — Z1231 Encounter for screening mammogram for malignant neoplasm of breast: Secondary | ICD-10-CM

## 2022-11-29 ENCOUNTER — Ambulatory Visit
Admission: RE | Admit: 2022-11-29 | Discharge: 2022-11-29 | Disposition: A | Discharge status: Home / Self Care | Payer: Medicare Other | Source: Ambulatory Visit | Attending: Obstetrics and Gynecology | Admitting: Obstetrics and Gynecology

## 2022-11-29 DIAGNOSIS — N644 Mastodynia: Secondary | ICD-10-CM

## 2022-12-05 ENCOUNTER — Other Ambulatory Visit: Payer: Self-pay | Admitting: Orthopedic Surgery

## 2022-12-05 DIAGNOSIS — M25512 Pain in left shoulder: Secondary | ICD-10-CM

## 2022-12-06 ENCOUNTER — Encounter: Payer: Self-pay | Admitting: Gastroenterology

## 2022-12-06 ENCOUNTER — Ambulatory Visit (INDEPENDENT_AMBULATORY_CARE_PROVIDER_SITE_OTHER): Payer: Medicare Other | Admitting: Gastroenterology

## 2022-12-06 VITALS — BP 138/75 | HR 67 | Temp 97.4°F | Ht 62.0 in | Wt 180.8 lb

## 2022-12-06 DIAGNOSIS — R1011 Right upper quadrant pain: Secondary | ICD-10-CM

## 2022-12-06 DIAGNOSIS — K219 Gastro-esophageal reflux disease without esophagitis: Secondary | ICD-10-CM

## 2022-12-06 DIAGNOSIS — K5904 Chronic idiopathic constipation: Secondary | ICD-10-CM | POA: Diagnosis not present

## 2022-12-06 DIAGNOSIS — R112 Nausea with vomiting, unspecified: Secondary | ICD-10-CM | POA: Diagnosis not present
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
## 2022-12-06 NOTE — Patient Instructions (Addendum)
Increase omeprazole to 40 mg once daily.  Given that you have 20 mg tablets you may take 2 of them until you run out and then call for new prescription.  Call with a progress report in 2 to 3 weeks  Continue Zofran as needed Continue stool softener and MiraLAX as needed.  Given your right upper quadrant pain, all of your abdominal imaging has been negative.  Also appears her gallbladder is functioning fine.  Given that the pain primarily is towards your back and radiating into the front as well you could have pain that is originating in the back and may need to consider further imaging of your thoracic spine.  Just in case this is possibly some gastritis or atypical GERD that is why we are increasing your omeprazole.  Follow up in 4 months, sooner if needed.   It was a pleasure to see you today. I want to create trusting relationships with patients. If you receive a survey regarding your visit,  I greatly appreciate you taking time to fill this out on paper or through your MyChart. I value your feedback.  Venetia Night, MSN, FNP-BC, AGACNP-BC [facility]

## 2022-12-25 ENCOUNTER — Other Ambulatory Visit: Payer: Medicare Other

## 2022-12-31 ENCOUNTER — Other Ambulatory Visit: Payer: Medicare Other

## 2023-01-06 ENCOUNTER — Other Ambulatory Visit: Payer: Self-pay | Admitting: Gastroenterology

## 2023-02-18 ENCOUNTER — Telehealth: Payer: Self-pay | Admitting: *Deleted

## 2023-02-18 ENCOUNTER — Other Ambulatory Visit: Payer: Self-pay | Admitting: Gastroenterology

## 2023-02-18 MED ORDER — OMEPRAZOLE 40 MG PO CPDR: 40.0000 mg | DELAYED_RELEASE_CAPSULE | Freq: Every day | ORAL | 3 refills | Status: DC

## 2023-02-18 NOTE — Telephone Encounter (Signed)
Pt called and states she needs a prescription for omeprazole 40mg  sent to [facility] in [place]. She needs a 90 day supply .

## 2023-03-05 ENCOUNTER — Telehealth: Payer: Self-pay | Admitting: *Deleted

## 2023-03-05 NOTE — Telephone Encounter (Signed)
Pt called and states she is having a pain in her right rib cage. She states she has diarrhea sometime and nausea. Please advise.

## 2023-03-06 ENCOUNTER — Other Ambulatory Visit: Payer: Self-pay | Admitting: Gastroenterology

## 2023-03-06 MED ORDER — ONDANSETRON 4 MG PO TBDP: 4.0000 mg | ORAL_TABLET | Freq: Three times a day (TID) | ORAL | 0 refills | Status: DC | PRN

## 2023-03-06 NOTE — Telephone Encounter (Signed)
Spoke to pt, informed her of recommendations. Pt voiced understanding. Pt states she does not have anymore zofran and she is taking the omerprazole and famotidine. Informed to make OV with Dr. Marletta Lor or Tobi Bastos.

## 2023-03-06 NOTE — Telephone Encounter (Signed)
Noted  

## 2023-03-20 ENCOUNTER — Encounter: Payer: Self-pay | Admitting: Gastroenterology

## 2023-03-20 ENCOUNTER — Ambulatory Visit (INDEPENDENT_AMBULATORY_CARE_PROVIDER_SITE_OTHER): Payer: Medicare Other | Admitting: Gastroenterology

## 2023-03-20 ENCOUNTER — Encounter: Payer: Self-pay | Admitting: *Deleted

## 2023-03-20 ENCOUNTER — Telehealth: Payer: Self-pay | Admitting: *Deleted

## 2023-03-20 VITALS — BP 127/80 | HR 62 | Temp 97.3°F | Ht 62.0 in | Wt 175.4 lb

## 2023-03-20 DIAGNOSIS — R1011 Right upper quadrant pain: Secondary | ICD-10-CM | POA: Diagnosis not present

## 2023-03-20 DIAGNOSIS — R1013 Epigastric pain: Secondary | ICD-10-CM

## 2023-03-20 MED ORDER — PANTOPRAZOLE SODIUM 40 MG PO TBEC: 40.0000 mg | DELAYED_RELEASE_TABLET | Freq: Every day | ORAL | 3 refills | Status: DC

## 2023-03-20 NOTE — Telephone Encounter (Signed)
LMOVM to call back to schedule EGD with Dr. Marletta Lor, ASA 2

## 2023-03-20 NOTE — Patient Instructions (Signed)
Let's stop omeprazole. Instead, I have sent in pantoprazole (Protonix), to take once each morning, 30 minutes before breakfast.   We are are arranging an upper endoscopy with Dr. Marletta Lor. If this is normal, we can consider sending you to a surgeon to talk about removing your gallbladder.  We will see you in 3 months!  I enjoyed seeing you again today! I value our relationship and want to provide genuine, compassionate, and quality care. You may receive a survey regarding your visit with me, and I welcome your feedback! Thanks so much for taking the time to complete this. I look forward to seeing you again.      Gelene Mink, PhD, ANP-BC [facility]
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
## 2023-03-20 NOTE — Telephone Encounter (Signed)
Pt returned call about scheduling an appointment. She states she is not home right now and will call back once she checks her calendar.

## 2023-03-20 NOTE — Telephone Encounter (Signed)
Pt has been scheduled for 04/09/23. Instructions mailed

## 2023-04-09 ENCOUNTER — Encounter (HOSPITAL_COMMUNITY): Payer: Self-pay

## 2023-04-09 ENCOUNTER — Encounter (HOSPITAL_COMMUNITY)
Admission: RE | Disposition: A | Discharge status: Home / Self Care | Payer: Self-pay | Source: Home / Self Care | Attending: Internal Medicine

## 2023-04-09 ENCOUNTER — Other Ambulatory Visit: Payer: Self-pay

## 2023-04-09 ENCOUNTER — Ambulatory Visit (HOSPITAL_COMMUNITY): Payer: Medicare Other | Admitting: Anesthesiology

## 2023-04-09 ENCOUNTER — Ambulatory Visit (HOSPITAL_COMMUNITY)
Admission: RE | Admit: 2023-04-09 | Discharge: 2023-04-09 | Disposition: A | Discharge status: Home / Self Care | Payer: Medicare Other | Attending: Internal Medicine | Admitting: Internal Medicine

## 2023-04-09 ENCOUNTER — Ambulatory Visit (HOSPITAL_BASED_OUTPATIENT_CLINIC_OR_DEPARTMENT_OTHER): Payer: Medicare Other | Admitting: Anesthesiology

## 2023-04-09 DIAGNOSIS — K2970 Gastritis, unspecified, without bleeding: Secondary | ICD-10-CM | POA: Insufficient documentation

## 2023-04-09 DIAGNOSIS — K449 Diaphragmatic hernia without obstruction or gangrene: Secondary | ICD-10-CM

## 2023-04-09 DIAGNOSIS — Z86010 Personal history of colonic polyps: Secondary | ICD-10-CM | POA: Insufficient documentation

## 2023-04-09 DIAGNOSIS — K30 Functional dyspepsia: Secondary | ICD-10-CM | POA: Diagnosis present

## 2023-04-09 DIAGNOSIS — Z87891 Personal history of nicotine dependence: Secondary | ICD-10-CM | POA: Insufficient documentation

## 2023-04-09 DIAGNOSIS — I10 Essential (primary) hypertension: Secondary | ICD-10-CM | POA: Diagnosis not present

## 2023-04-09 DIAGNOSIS — K319 Disease of stomach and duodenum, unspecified: Secondary | ICD-10-CM | POA: Diagnosis not present

## 2023-04-09 DIAGNOSIS — R1013 Epigastric pain: Secondary | ICD-10-CM

## 2023-04-09 DIAGNOSIS — F419 Anxiety disorder, unspecified: Secondary | ICD-10-CM | POA: Diagnosis not present

## 2023-04-09 DIAGNOSIS — G8929 Other chronic pain: Secondary | ICD-10-CM | POA: Diagnosis not present

## 2023-04-09 DIAGNOSIS — Z85828 Personal history of other malignant neoplasm of skin: Secondary | ICD-10-CM | POA: Diagnosis not present

## 2023-04-09 DIAGNOSIS — Z79899 Other long term (current) drug therapy: Secondary | ICD-10-CM | POA: Diagnosis not present

## 2023-04-09 DIAGNOSIS — K219 Gastro-esophageal reflux disease without esophagitis: Secondary | ICD-10-CM | POA: Insufficient documentation

## 2023-04-09 DIAGNOSIS — M545 Low back pain, unspecified: Secondary | ICD-10-CM | POA: Diagnosis not present

## 2023-04-09 HISTORY — PX: BIOPSY: SHX5522

## 2023-04-09 HISTORY — PX: ESOPHAGOGASTRODUODENOSCOPY (EGD) WITH PROPOFOL: SHX5813

## 2023-04-09 SURGERY — ESOPHAGOGASTRODUODENOSCOPY (EGD) WITH PROPOFOL
Anesthesia: General

## 2023-04-09 MED ORDER — LIDOCAINE HCL (CARDIAC) PF 100 MG/5ML IV SOSY
PREFILLED_SYRINGE | INTRAVENOUS | Status: DC | PRN
  Administered 2023-04-09: 60 mg via INTRAVENOUS

## 2023-04-09 MED ORDER — LACTATED RINGERS IV SOLN: INTRAVENOUS | Status: DC

## 2023-04-09 MED ORDER — PROPOFOL 10 MG/ML IV BOLUS
INTRAVENOUS | Status: DC | PRN
  Administered 2023-04-09: 80 mg via INTRAVENOUS

## 2023-04-09 NOTE — Interval H&P Note (Signed)
History and Physical Interval Note:  04/09/2023 1:23 PM  Victoria Molina  has presented today for surgery, with the diagnosis of dyspepsia.  The various methods of treatment have been discussed with the patient and family. After consideration of risks, benefits and other options for treatment, the patient has consented to  Procedure(s) with comments: ESOPHAGOGASTRODUODENOSCOPY (EGD) WITH PROPOFOL (N/A) - 1:15 pm, asa 2 as a surgical intervention.  The patient's history has been reviewed, patient examined, no change in status, stable for surgery.  I have reviewed the patient's chart and labs.  Questions were answered to the patient's satisfaction.     Lanelle Bal

## 2023-04-09 NOTE — Discharge Instructions (Addendum)
EGD Discharge instructions Please read the instructions outlined below and refer to this sheet in the next few weeks. These discharge instructions provide you with general information on caring for yourself after you leave the hospital. Your doctor may also give you specific instructions. While your treatment has been planned according to the most current medical practices available, unavoidable complications occasionally occur. If you have any problems or questions after discharge, please call your doctor. ACTIVITY You may resume your regular activity but move at a slower pace for the next 24 hours.  Take frequent rest periods for the next 24 hours.  Walking will help expel (get rid of) the air and reduce the bloated feeling in your abdomen.  No driving for 24 hours (because of the anesthesia (medicine) used during the test).  You may shower.  Do not sign any important legal documents or operate any machinery for 24 hours (because of the anesthesia used during the test).  NUTRITION Drink plenty of fluids.  You may resume your normal diet.  Begin with a light meal and progress to your normal diet.  Avoid alcoholic beverages for 24 hours or as instructed by your caregiver.  MEDICATIONS You may resume your normal medications unless your caregiver tells you otherwise.  WHAT YOU CAN EXPECT TODAY You may experience abdominal discomfort such as a feeling of fullness or "gas" pains.  FOLLOW-UP Your doctor will discuss the results of your test with you.  SEEK IMMEDIATE MEDICAL ATTENTION IF ANY OF THE FOLLOWING OCCUR: Excessive nausea (feeling sick to your stomach) and/or vomiting.  Severe abdominal pain and distention (swelling).  Trouble swallowing.  Temperature over 101 F (37.8 C).  Rectal bleeding or vomiting of blood.    Your EGD revealed mild amount inflammation in your stomach.  I took biopsies of this to rule out infection with a bacteria called H. pylori.  Await pathology results, my  office will contact you.  Esophagus and small bowel appeared normal.   Continue on Pantoprazole daily.   Follow up in GI office in 6-8 weeks.   I hope you have a great rest of your week!  Hennie Duos. Marletta Lor, D.O. Gastroenterology and Hepatology [facility]

## 2023-04-09 NOTE — Transfer of Care (Signed)
Immediate Anesthesia Transfer of Care Note  Patient: Victoria Molina  Procedure(s) Performed: ESOPHAGOGASTRODUODENOSCOPY (EGD) WITH PROPOFOL BIOPSY  Patient Location: Endoscopy Unit  Anesthesia Type:General  Level of Consciousness: awake  Airway & Oxygen Therapy: Patient Spontanous Breathing  Post-op Assessment: Report given to RN and Post -op Vital signs reviewed and stable  Post vital signs: Reviewed and stable  Last Vitals:  Vitals Value Taken Time  BP 134/63 04/09/23 1339  Temp 36.7 C 04/09/23 1339  Pulse 55 04/09/23 1339  Resp 20 04/09/23 1339  SpO2 94 % 04/09/23 1339    Last Pain:  Vitals:   04/09/23 1339  TempSrc: Oral  PainSc: 0-No pain      Patients Stated Pain Goal: 7 (04/09/23 1201)  Complications: No notable events documented.

## 2023-04-09 NOTE — Anesthesia Preprocedure Evaluation (Signed)
Anesthesia Evaluation  Patient identified by MRN, date of birth, ID band Patient awake    Reviewed: Allergy & Precautions, H&P , NPO status , Patient's Chart, lab work & pertinent test results  History of Anesthesia Complications (+) PONV, Family history of anesthesia reaction and history of anesthetic complications  Airway Mallampati: II  TM Distance: >3 FB Neck ROM: Full    Dental no notable dental hx. (+) Dental Advisory Given,    Pulmonary neg pulmonary ROS, former smoker   Pulmonary exam normal breath sounds clear to auscultation       Cardiovascular Exercise Tolerance: Good hypertension, Pt. on medications Normal cardiovascular exam Rhythm:Regular Rate:Normal     Neuro/Psych  PSYCHIATRIC DISORDERS Anxiety     negative neurological ROS     GI/Hepatic Neg liver ROS, hiatal hernia, PUD,GERD  Medicated and Poorly Controlled,,  Endo/Other  negative endocrine ROS    Renal/GU negative Renal ROS  negative genitourinary   Musculoskeletal  (+) Arthritis , Osteoarthritis,    Abdominal   Peds negative pediatric ROS (+)  Hematology negative hematology ROS (+)   Anesthesia Other Findings Lumbar stenosis with neurogenic claudication Nausea with vomiting Postoperative CSF leak    Reproductive/Obstetrics negative OB ROS                             Anesthesia Physical Anesthesia Plan  ASA: 2  Anesthesia Plan: General   Post-op Pain Management: Minimal or no pain anticipated   Induction: Intravenous  PONV Risk Score and Plan: 1 and Propofol infusion  Airway Management Planned: Nasal Cannula and Natural Airway  Additional Equipment:   Intra-op Plan:   Post-operative Plan:   Informed Consent: I have reviewed the patients History and Physical, chart, labs and discussed the procedure including the risks, benefits and alternatives for the proposed anesthesia with the patient or  authorized representative who has indicated his/her understanding and acceptance.     Dental advisory given  Plan Discussed with: CRNA and Surgeon  Anesthesia Plan Comments:        Anesthesia Quick Evaluation

## 2023-04-09 NOTE — Op Note (Signed)
[facility] Patient Name: Victoria Molina Procedure Date: 04/09/2023 1:25 PM MRN: [id number] Date of Birth: 1946-08-26 Attending MD: Hennie Duos. Marletta Lor , [place], [id number] CSN: [id number] Age: 77 Admit Type: Outpatient Procedure:                Upper GI endoscopy Indications:              Functional Dyspepsia Providers:                Hennie Duos. Marletta Lor, DO, Angelica Ran, Pandora Leiter,                            Technician Referring MD:              Medicines:                See the Anesthesia note for documentation of the                            administered medications Complications:            No immediate complications. Estimated Blood Loss:     Estimated blood loss was minimal. Procedure:                Pre-Anesthesia Assessment:                           - The anesthesia plan was to use monitored                            anesthesia care (MAC).                           After obtaining informed consent, the endoscope was                            passed under direct vision. Throughout the                            procedure, the patient's blood pressure, pulse, and                            oxygen saturations were monitored continuously. The                            [id number]-H190 ([id number]) scope was introduced through the                            mouth, and advanced to the second part of duodenum.                            The upper GI endoscopy was accomplished without                            difficulty. The patient tolerated the procedure                            well. Scope In: 1:34:11 PM Scope  Out: 1:37:18 PM Total Procedure Duration: 0 hours 3 minutes 7 seconds  Findings:      The examined esophagus was normal.      Patchy mild inflammation characterized by erythema was found in the       gastric body and in the gastric antrum. Biopsies were taken with a cold       forceps for Helicobacter pylori testing.      The duodenal bulb, first portion of the duodenum  and second portion of       the duodenum were normal. Impression:               - Normal esophagus.                           - Gastritis. Biopsied.                           - Normal duodenal bulb, first portion of the                            duodenum and second portion of the duodenum. Moderate Sedation:      Per Anesthesia Care Recommendation:           - Patient has a contact number available for                            emergencies. The signs and symptoms of potential                            delayed complications were discussed with the                            patient. Return to normal activities tomorrow.                            Written discharge instructions were provided to the                            patient.                           - Resume previous diet.                           - Continue present medications.                           - Await pathology results.                           - Use Protonix (pantoprazole) 40 mg PO daily.                           - Return to GI clinic in 3 months. Procedure Code(s):        --- Professional ---                           [id number],  Esophagogastroduodenoscopy, flexible,                            transoral; with biopsy, single or multiple Diagnosis Code(s):        --- Professional ---                           K29.70, Gastritis, unspecified, without bleeding                           K30, Functional dyspepsia CPT copyright 2022 American Medical Association. All rights reserved. The codes documented in this report are preliminary and upon coder review may  be revised to meet current compliance requirements. Hennie Duos. Marletta Lor, DO Hennie Duos. Marletta Lor, DO 04/09/2023 1:40:22 PM This report has been signed electronically. Number of Addenda: 0

## 2023-04-09 NOTE — Anesthesia Postprocedure Evaluation (Signed)
Anesthesia Post Note  Patient: Victoria Molina  Procedure(s) Performed: ESOPHAGOGASTRODUODENOSCOPY (EGD) WITH PROPOFOL BIOPSY  Patient location during evaluation: Phase II Anesthesia Type: General Level of consciousness: awake and alert and oriented Pain management: pain level controlled Vital Signs Assessment: post-procedure vital signs reviewed and stable Respiratory status: spontaneous breathing, nonlabored ventilation and respiratory function stable Cardiovascular status: blood pressure returned to baseline and stable Postop Assessment: no apparent nausea or vomiting Anesthetic complications: no  No notable events documented.   Last Vitals:  Vitals:   04/09/23 1201 04/09/23 1339  BP: (!) 177/64 134/63  Pulse: 63 (!) 55  Resp: 16 20  Temp: 36.6 C 36.7 C  SpO2: 96% 94%    Last Pain:  Vitals:   04/09/23 1339  TempSrc: Oral  PainSc: 0-No pain                 Donnis Pecha C Sabrie Moritz

## 2023-04-11 LAB — SURGICAL PATHOLOGY

## 2023-04-15 ENCOUNTER — Encounter (HOSPITAL_COMMUNITY): Payer: Self-pay | Admitting: Internal Medicine

## 2023-06-20 ENCOUNTER — Ambulatory Visit (INDEPENDENT_AMBULATORY_CARE_PROVIDER_SITE_OTHER): Payer: Medicare Other | Admitting: Gastroenterology

## 2023-06-20 ENCOUNTER — Encounter: Payer: Self-pay | Admitting: Gastroenterology

## 2023-06-20 ENCOUNTER — Other Ambulatory Visit: Payer: Self-pay | Admitting: *Deleted

## 2023-06-20 VITALS — BP 112/68 | HR 71 | Temp 97.8°F | Ht 62.0 in | Wt 174.6 lb

## 2023-06-20 DIAGNOSIS — K828 Other specified diseases of gallbladder: Secondary | ICD-10-CM

## 2023-06-20 DIAGNOSIS — R1011 Right upper quadrant pain: Secondary | ICD-10-CM | POA: Diagnosis not present

## 2023-06-20 NOTE — Patient Instructions (Signed)
Continue pantoprazole once a day!  I am referring you to Dr. Lovell Sheehan to talk about removing your gallbladder.  We will see you in 6 months!  Happy early early birthday!  I enjoyed seeing you again today! I value our relationship and want to provide genuine, compassionate, and quality care. You may receive a survey regarding your visit with me, and I welcome your feedback! Thanks so much for taking the time to complete this. I look forward to seeing you again.      Gelene Mink, PhD, ANP-BC [facility] Gastroenterology
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
## 2023-08-08 ENCOUNTER — Ambulatory Visit: Payer: Medicare Other | Admitting: General Surgery

## 2023-09-03 ENCOUNTER — Ambulatory Visit (INDEPENDENT_AMBULATORY_CARE_PROVIDER_SITE_OTHER): Payer: Medicare Other | Admitting: General Surgery

## 2023-09-03 ENCOUNTER — Encounter: Payer: Self-pay | Admitting: General Surgery

## 2023-09-03 VITALS — BP 132/73 | HR 73 | Temp 98.0°F | Resp 12 | Ht 62.0 in | Wt 233.0 lb

## 2023-09-03 DIAGNOSIS — K8050 Calculus of bile duct without cholangitis or cholecystitis without obstruction: Secondary | ICD-10-CM | POA: Diagnosis not present
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
## 2023-09-11 ENCOUNTER — Ambulatory Visit (HOSPITAL_COMMUNITY)
Admission: RE | Admit: 2023-09-11 | Discharge: 2023-09-11 | Disposition: A | Discharge status: Home / Self Care | Payer: Medicare Other | Source: Ambulatory Visit | Attending: General Surgery | Admitting: General Surgery

## 2023-09-11 DIAGNOSIS — K8050 Calculus of bile duct without cholangitis or cholecystitis without obstruction: Secondary | ICD-10-CM | POA: Insufficient documentation

## 2023-09-17 ENCOUNTER — Encounter: Payer: Self-pay | Admitting: General Surgery

## 2023-09-17 ENCOUNTER — Ambulatory Visit (INDEPENDENT_AMBULATORY_CARE_PROVIDER_SITE_OTHER): Payer: Medicare Other | Admitting: General Surgery

## 2023-09-17 VITALS — BP 120/73 | HR 69 | Temp 98.3°F | Resp 16 | Ht 62.0 in | Wt 180.0 lb

## 2023-09-17 DIAGNOSIS — K8050 Calculus of bile duct without cholangitis or cholecystitis without obstruction: Secondary | ICD-10-CM | POA: Diagnosis not present

## 2023-09-17 NOTE — Progress Notes (Signed)
Subjective:     Victoria Molina  Patient here to follow-up her test results.  Ultrasound was negative for cholelithiasis.  It did show some steatosis.  She states that she still intermittently has right upper quadrant abdominal pain.  It was something that she has had for some time.  She denies any nausea, vomiting, fever, chills, or jaundice. Objective:    BP 120/73   Pulse 69   Temp 98.3 F (36.8 C) (Oral)   Resp 16   Ht 5\' 2"  (1.575 m)   Wt 180 lb (81.6 kg)   SpO2 94%   BMI 32.92 kg/m   General:  alert, cooperative, and no distress  Abdomen soft, nontender, nondistended.  No rigidity noted.     Assessment:    Biliary colic, chronic    Plan:   Patient has changed her mind and she does not want surgical intervention at the present time.  I told her to call me should she develop worsening symptoms or change her mind.  Follow-up here as needed.

## 2023-10-01 ENCOUNTER — Telehealth: Payer: Self-pay

## 2023-10-01 NOTE — Telephone Encounter (Signed)
Pt phoned stating that Dr Lovell Sheehan told her she had a fatty liver and that she needs to tell us about it. Her report  is in her chart and the pt declined appt to be seen sooner that February. She wanted you to be aware and she didn't want another Rx if she didn't have to have it. Please advise

## 2023-10-01 NOTE — Telephone Encounter (Signed)
Noted. I have reviewed the ultrasound. We don't need to do a prescription right now. We can discuss at her appointment in February. Thanks!

## 2023-10-02 NOTE — Telephone Encounter (Signed)
Phoned and advised the pt of your recommendations and the pt expressed understanding

## 2023-12-19 ENCOUNTER — Encounter: Payer: Self-pay | Admitting: Gastroenterology

## 2023-12-19 ENCOUNTER — Ambulatory Visit (INDEPENDENT_AMBULATORY_CARE_PROVIDER_SITE_OTHER): Payer: Medicare Other | Admitting: Gastroenterology

## 2023-12-19 VITALS — BP 135/76 | HR 63 | Temp 97.9°F | Ht 62.0 in | Wt 182.2 lb

## 2023-12-19 DIAGNOSIS — K5900 Constipation, unspecified: Secondary | ICD-10-CM

## 2023-12-19 DIAGNOSIS — K219 Gastro-esophageal reflux disease without esophagitis: Secondary | ICD-10-CM | POA: Diagnosis not present

## 2023-12-19 MED ORDER — LUBIPROSTONE 8 MCG PO CAPS: 8.0000 ug | ORAL_CAPSULE | Freq: Two times a day (BID) | ORAL | 3 refills | Status: DC

## 2023-12-19 NOTE — Patient Instructions (Signed)
 Continue Benefiber.  I have sent in Amitiza to start taking for constipation instead of the stool softeners. Start off by just taking one at dinner WITH FOOD to avoid nausea. If this is not helpful, increase it to taking with breakfast and dinner (needs to be WITH FOOD to avoid nausea).  If this is too expensive or not helpful, please call us!  We will see you in 3 months!  Happy Birthday!  I enjoyed seeing you again today! I value our relationship and want to provide genuine, compassionate, and quality care. You may receive a survey regarding your visit with me, and I welcome your feedback! Thanks so much for taking the time to complete this. I look forward to seeing you again.      Gelene Mink, PhD, ANP-BC [facility]
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
## 2023-12-20 ENCOUNTER — Telehealth: Payer: Self-pay

## 2023-12-20 NOTE — Telephone Encounter (Signed)
 Pt phoned advising she will not be getting the Amitiza because it is $500.00. I can do a PA on it but it will be next Monday before I can do it. Or is there something else you want to send in. Please advise

## 2023-12-23 NOTE — Telephone Encounter (Signed)
 LEt's try the PA first.

## 2023-12-23 NOTE — Telephone Encounter (Signed)
 Noted.

## 2023-12-23 NOTE — Telephone Encounter (Signed)
 FYI:   I called the pharmacy because all the pt has is medicare. Her co-pay is still $557.00. maybe its due to new year and deductible has not been made. Please advise

## 2023-12-25 NOTE — Telephone Encounter (Signed)
 Due to cost, let's just continue with Benefiber increased to twice a day. If needed, she can add a 1/2 capful to full cap of Miralax as well each day. Appears any prescriptions will be too pricey.

## 2023-12-26 NOTE — Telephone Encounter (Signed)
Phoned the pt and LMOVM for the pt to return call 

## 2023-12-30 NOTE — Telephone Encounter (Signed)
 Returned the pt's call advised of her recommendations / instructions regarding meds to the pt. Pt expressed understanding

## 2024-03-16 ENCOUNTER — Other Ambulatory Visit: Payer: Self-pay | Admitting: Gastroenterology

## 2024-03-17 ENCOUNTER — Encounter: Payer: Self-pay | Admitting: Gastroenterology

## 2024-03-17 ENCOUNTER — Ambulatory Visit (INDEPENDENT_AMBULATORY_CARE_PROVIDER_SITE_OTHER): Payer: Medicare Other | Admitting: Gastroenterology

## 2024-03-17 VITALS — BP 146/73 | HR 72 | Temp 97.8°F | Ht 62.0 in | Wt 178.0 lb

## 2024-03-17 DIAGNOSIS — K219 Gastro-esophageal reflux disease without esophagitis: Secondary | ICD-10-CM | POA: Diagnosis not present

## 2024-03-17 DIAGNOSIS — K5900 Constipation, unspecified: Secondary | ICD-10-CM | POA: Diagnosis not present

## 2024-03-17 DIAGNOSIS — R1011 Right upper quadrant pain: Secondary | ICD-10-CM | POA: Diagnosis not present

## 2024-03-17 DIAGNOSIS — K760 Fatty (change of) liver, not elsewhere classified: Secondary | ICD-10-CM

## 2024-03-17 DIAGNOSIS — R1032 Left lower quadrant pain: Secondary | ICD-10-CM | POA: Diagnosis not present

## 2024-03-17 DIAGNOSIS — Z860101 Personal history of adenomatous and serrated colon polyps: Secondary | ICD-10-CM
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
## 2024-03-17 NOTE — Patient Instructions (Signed)
 Let's try half pack of the fiber each day.   If no good bowel movement on any given day, you can take 1/2 cap to full capful of Miralax .   Please call if any worsening left sided discomfort!  We will see you in 3 months!  I enjoyed seeing you again today! I value our relationship and want to provide genuine, compassionate, and quality care. You may receive a survey regarding your visit with me, and I welcome your feedback! Thanks so much for taking the time to complete this. I look forward to seeing you again.      Delman Ferns, PhD, ANP-BC [facility]

## 2024-03-19 ENCOUNTER — Telehealth: Payer: Self-pay | Admitting: Gastroenterology

## 2024-03-19 DIAGNOSIS — R1030 Lower abdominal pain, unspecified: Secondary | ICD-10-CM

## 2024-03-19 NOTE — Telephone Encounter (Signed)
 Patient called and said she did want to get the CT done

## 2024-03-20 NOTE — Telephone Encounter (Signed)
 The pt stated she changed her mind and wants CT done

## 2024-03-25 NOTE — Addendum Note (Signed)
 Addended by: Feliz Hosteller on: 03/25/2024 02:24 PM   Modules accepted: Orders

## 2024-03-25 NOTE — Telephone Encounter (Signed)
 Called pt. Gave CT appointment detail for tomorrow. Aware to check in via ER side

## 2024-03-25 NOTE — Telephone Encounter (Signed)
 Pt phoned and LMOVM regarding CT date.  Phoned the pt back got her vm, LMOVM that once the schedulers have scheduled the appt she would receive a call.

## 2024-03-25 NOTE — Telephone Encounter (Signed)
 Please arrange CT without contrast due to LLQ abdominal pain, suspect diverticulitis. (Allergy to contrast).

## 2024-03-26 ENCOUNTER — Ambulatory Visit (HOSPITAL_COMMUNITY)
Admission: RE | Admit: 2024-03-26 | Discharge: 2024-03-26 | Disposition: A | Discharge status: Home / Self Care | Source: Ambulatory Visit | Attending: Gastroenterology | Admitting: Gastroenterology

## 2024-03-26 DIAGNOSIS — R1030 Lower abdominal pain, unspecified: Secondary | ICD-10-CM | POA: Insufficient documentation

## 2024-03-26 MED ORDER — BARIUM SULFATE 2 % PO SUSP
900.0000 mL | Freq: Once | ORAL | Status: AC
  Administered 2024-03-26: 900 mL via ORAL

## 2024-03-26 NOTE — Telephone Encounter (Signed)
 Spoke with pt. Victoria Molina wants her to drink the oral contrast so she will need to arrive at 5pm instead. She voiced understanding

## 2024-04-02 ENCOUNTER — Ambulatory Visit: Payer: Self-pay | Admitting: Gastroenterology

## 2024-04-13 ENCOUNTER — Other Ambulatory Visit: Payer: Self-pay | Admitting: Orthopedic Surgery

## 2024-04-13 DIAGNOSIS — M5416 Radiculopathy, lumbar region: Secondary | ICD-10-CM

## 2024-04-18 ENCOUNTER — Inpatient Hospital Stay
Admission: RE | Admit: 2024-04-18 | Discharge: 2024-04-18 | Discharge status: Home / Self Care | Source: Ambulatory Visit | Attending: Orthopedic Surgery

## 2024-04-18 DIAGNOSIS — M5416 Radiculopathy, lumbar region: Secondary | ICD-10-CM

## 2024-05-06 ENCOUNTER — Other Ambulatory Visit

## 2024-06-17 ENCOUNTER — Encounter: Payer: Self-pay | Admitting: Gastroenterology

## 2024-06-17 ENCOUNTER — Ambulatory Visit: Admitting: Gastroenterology

## 2024-06-17 VITALS — BP 125/72 | HR 80 | Temp 98.3°F | Ht 62.0 in | Wt 175.2 lb

## 2024-06-17 DIAGNOSIS — K5900 Constipation, unspecified: Secondary | ICD-10-CM

## 2024-06-17 DIAGNOSIS — K219 Gastro-esophageal reflux disease without esophagitis: Secondary | ICD-10-CM

## 2024-06-17 DIAGNOSIS — K760 Fatty (change of) liver, not elsewhere classified: Secondary | ICD-10-CM | POA: Diagnosis not present

## 2024-06-17 DIAGNOSIS — B370 Candidal stomatitis: Secondary | ICD-10-CM | POA: Insufficient documentation

## 2024-06-17 MED ORDER — NYSTATIN 100000 UNIT/ML MT SUSP: 5.0000 mL | Freq: Four times a day (QID) | OROMUCOSAL | 1 refills | Status: AC

## 2024-06-17 NOTE — Patient Instructions (Signed)
 I recommend taking Benefiber twice a day.  Take morning and evening.  You can also take the Dulcolax as needed.  I have sent in a liquid to take 4 times a day for oral thrush.  Make sure you swishing around in your mouth and hold it as long as possible and swallow.  Do this for 2 weeks.  You can have the blood work done when you see your doctor.  I have also included the handout about the fatty liver.  Right now you do not appear to have any significant stiffness going on.  We will see you back in 3 months!  I enjoyed seeing you again today! I value our relationship and want to provide genuine, compassionate, and quality care. You may receive a survey regarding your visit with me, and I welcome your feedback! Thanks so much for taking the time to complete this. I look forward to seeing you again.      Therisa MICAEL Stager, PhD, ANP-BC [facility]
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
## 2024-08-05 ENCOUNTER — Encounter: Payer: Self-pay | Admitting: Gastroenterology

## 2024-09-07 ENCOUNTER — Telehealth: Payer: Self-pay | Admitting: Gastroenterology

## 2024-09-07 NOTE — Telephone Encounter (Signed)
 Returned the pt's call and was advised that she has / had pain left lower side of abdomen area. Her last BM was yesterday. No nausea or vomiting. Pt states it is better but thought it may be coming from stool in the colon. Please advise

## 2024-09-07 NOTE — Telephone Encounter (Signed)
 Pt called into the office, stated she left a message last week, that she is having pain in her side and what can be done about it.

## 2024-09-08 NOTE — Telephone Encounter (Signed)
 Keep upcoming appt with me. Hx of chronic constipation. She has done better with dulcolax in the past. Take this prn. If any fever, chills, worsening abdominal pain, please call us  immediately.

## 2024-09-11 NOTE — Telephone Encounter (Signed)
 Phoned and advised the pt of the recommendations / what to look for (worsening symptoms) and to call us  immediately. If it is the weekend please go to the ED to be evaluated. Pt expressed understanding.

## 2024-10-01 ENCOUNTER — Ambulatory Visit (INDEPENDENT_AMBULATORY_CARE_PROVIDER_SITE_OTHER): Admitting: Gastroenterology

## 2024-10-01 VITALS — BP 134/77 | HR 64 | Temp 98.4°F | Ht 62.0 in | Wt 176.6 lb

## 2024-10-01 DIAGNOSIS — R1032 Left lower quadrant pain: Secondary | ICD-10-CM

## 2024-10-01 DIAGNOSIS — K219 Gastro-esophageal reflux disease without esophagitis: Secondary | ICD-10-CM

## 2024-10-01 DIAGNOSIS — K581 Irritable bowel syndrome with constipation: Secondary | ICD-10-CM | POA: Diagnosis not present

## 2024-10-01 DIAGNOSIS — K5900 Constipation, unspecified: Secondary | ICD-10-CM
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
## 2024-10-01 NOTE — Patient Instructions (Signed)
 Continue stool softener once to twice a day.  You can take Miralax  twice a day if needed. Take the second dose on any given day that you have more sluggish bowel movements.   Continue fiber.  Avoid straining, limit toilet time to 2-3 minutes at the most!  We will see you in 3 months!  I enjoyed seeing you again today! I value our relationship and want to provide genuine, compassionate, and quality care. You may receive a survey regarding your visit with me, and I welcome your feedback! Thanks so much for taking the time to complete this. I look forward to seeing you again.      Therisa MICAEL Stager, PhD, ANP-BC [facility] Gastroenterology

## 2024-10-07 ENCOUNTER — Other Ambulatory Visit: Payer: Self-pay | Admitting: Neurosurgery

## 2024-10-07 DIAGNOSIS — M546 Pain in thoracic spine: Secondary | ICD-10-CM

## 2024-10-07 DIAGNOSIS — R102 Pelvic and perineal pain unspecified side: Secondary | ICD-10-CM

## 2024-11-02 ENCOUNTER — Ambulatory Visit
Admission: RE | Admit: 2024-11-02 | Discharge: 2024-11-02 | Disposition: A | Source: Ambulatory Visit | Attending: Neurosurgery | Admitting: Neurosurgery

## 2024-11-02 DIAGNOSIS — M546 Pain in thoracic spine: Secondary | ICD-10-CM

## 2024-11-02 DIAGNOSIS — R102 Pelvic and perineal pain unspecified side: Secondary | ICD-10-CM

## 2024-12-30 ENCOUNTER — Ambulatory Visit: Admitting: Gastroenterology
